# Patient Record
Sex: Female | Born: 1965 | Race: White | Hispanic: No | Marital: Married | Smoking: Never smoker
Health system: Southern US, Community
[De-identification: ages and names within clinical notes are randomized; demographics above are authoritative.]

## PROBLEM LIST (undated history)

## (undated) DIAGNOSIS — Z9889 Other specified postprocedural states: Secondary | ICD-10-CM

## (undated) DIAGNOSIS — K219 Gastro-esophageal reflux disease without esophagitis: Secondary | ICD-10-CM

## (undated) DIAGNOSIS — I471 Supraventricular tachycardia, unspecified: Secondary | ICD-10-CM

## (undated) DIAGNOSIS — S060X9A Concussion with loss of consciousness of unspecified duration, initial encounter: Secondary | ICD-10-CM

## (undated) DIAGNOSIS — K429 Umbilical hernia without obstruction or gangrene: Secondary | ICD-10-CM

## (undated) DIAGNOSIS — S82892A Other fracture of left lower leg, initial encounter for closed fracture: Secondary | ICD-10-CM

## (undated) DIAGNOSIS — T7840XA Allergy, unspecified, initial encounter: Secondary | ICD-10-CM

## (undated) DIAGNOSIS — S060XAA Concussion with loss of consciousness status unknown, initial encounter: Secondary | ICD-10-CM

## (undated) DIAGNOSIS — M1990 Unspecified osteoarthritis, unspecified site: Secondary | ICD-10-CM

## (undated) DIAGNOSIS — I351 Nonrheumatic aortic (valve) insufficiency: Secondary | ICD-10-CM

## (undated) DIAGNOSIS — Z860101 Personal history of adenomatous and serrated colon polyps: Secondary | ICD-10-CM

## (undated) DIAGNOSIS — Z860100 Personal history of colon polyps, unspecified: Secondary | ICD-10-CM

## (undated) DIAGNOSIS — R001 Bradycardia, unspecified: Secondary | ICD-10-CM

## (undated) DIAGNOSIS — J45909 Unspecified asthma, uncomplicated: Secondary | ICD-10-CM

## (undated) DIAGNOSIS — K635 Polyp of colon: Secondary | ICD-10-CM

## (undated) DIAGNOSIS — I499 Cardiac arrhythmia, unspecified: Secondary | ICD-10-CM

## (undated) DIAGNOSIS — E785 Hyperlipidemia, unspecified: Secondary | ICD-10-CM

## (undated) DIAGNOSIS — G709 Myoneural disorder, unspecified: Secondary | ICD-10-CM

## (undated) DIAGNOSIS — R011 Cardiac murmur, unspecified: Secondary | ICD-10-CM

## (undated) DIAGNOSIS — D332 Benign neoplasm of brain, unspecified: Secondary | ICD-10-CM

## (undated) HISTORY — DX: Allergy, unspecified, initial encounter: T78.40XA

## (undated) HISTORY — DX: Concussion with loss of consciousness of unspecified duration, initial encounter: S06.0X9A

## (undated) HISTORY — DX: Personal history of colon polyps, unspecified: Z86.0100

## (undated) HISTORY — DX: Unspecified osteoarthritis, unspecified site: M19.90

## (undated) HISTORY — DX: Cardiac murmur, unspecified: R01.1

## (undated) HISTORY — DX: Umbilical hernia without obstruction or gangrene: K42.9

## (undated) HISTORY — DX: Gastro-esophageal reflux disease without esophagitis: K21.9

## (undated) HISTORY — DX: Personal history of adenomatous and serrated colon polyps: Z86.0101

## (undated) HISTORY — DX: Concussion with loss of consciousness status unknown, initial encounter: S06.0XAA

## (undated) HISTORY — DX: Polyp of colon: K63.5

## (undated) HISTORY — DX: Nonrheumatic aortic (valve) insufficiency: I35.1

## (undated) HISTORY — PX: DILATION AND CURETTAGE OF UTERUS: SHX78

## (undated) HISTORY — DX: Supraventricular tachycardia, unspecified: I47.10

## (undated) HISTORY — DX: Other fracture of left lower leg, initial encounter for closed fracture: S82.892A

## (undated) HISTORY — DX: Hyperlipidemia, unspecified: E78.5

## (undated) HISTORY — DX: Supraventricular tachycardia: I47.1

## (undated) HISTORY — PX: LEFT HEART CATH: CATH118248

## (undated) HISTORY — DX: Other specified postprocedural states: Z98.890

## (undated) HISTORY — DX: Cardiac arrhythmia, unspecified: I49.9

## (undated) HISTORY — DX: Bradycardia, unspecified: R00.1

## (undated) HISTORY — DX: Myoneural disorder, unspecified: G70.9

---

## 2001-12-22 ENCOUNTER — Ambulatory Visit (HOSPITAL_COMMUNITY): Admission: RE | Admit: 2001-12-22 | Discharge: 2001-12-22 | Payer: Self-pay | Admitting: Pulmonary Disease

## 2001-12-30 ENCOUNTER — Ambulatory Visit (HOSPITAL_COMMUNITY): Admission: RE | Admit: 2001-12-30 | Discharge: 2001-12-30 | Payer: Self-pay | Admitting: Pulmonary Disease

## 2004-06-16 ENCOUNTER — Ambulatory Visit (HOSPITAL_COMMUNITY): Admission: RE | Admit: 2004-06-16 | Discharge: 2004-06-16 | Payer: Self-pay | Admitting: Pulmonary Disease

## 2006-04-21 ENCOUNTER — Ambulatory Visit (HOSPITAL_COMMUNITY): Admission: RE | Admit: 2006-04-21 | Discharge: 2006-04-21 | Payer: Self-pay | Admitting: Pulmonary Disease

## 2006-05-13 ENCOUNTER — Ambulatory Visit (HOSPITAL_COMMUNITY): Admission: RE | Admit: 2006-05-13 | Discharge: 2006-05-13 | Payer: Self-pay | Admitting: *Deleted

## 2006-09-07 DIAGNOSIS — R079 Chest pain, unspecified: Secondary | ICD-10-CM

## 2006-09-07 DIAGNOSIS — Z9889 Other specified postprocedural states: Secondary | ICD-10-CM

## 2006-09-07 HISTORY — DX: Other specified postprocedural states: Z98.890

## 2006-09-07 HISTORY — DX: Chest pain, unspecified: R07.9

## 2007-04-01 ENCOUNTER — Ambulatory Visit (HOSPITAL_COMMUNITY): Admission: RE | Admit: 2007-04-01 | Discharge: 2007-04-01 | Payer: Self-pay | Admitting: *Deleted

## 2007-11-24 ENCOUNTER — Ambulatory Visit (HOSPITAL_COMMUNITY): Admission: RE | Admit: 2007-11-24 | Discharge: 2007-11-24 | Payer: Self-pay | Admitting: Pulmonary Disease

## 2011-01-23 NOTE — Procedures (Signed)
NAME:  Bidwell, Jenavee                   ACCOUNT NO.:  [id number]   MEDICAL RECORD NO.:  [id number]          PATIENT TYPE:  OUT   LOCATION:  RAD                           FACILITY:  [facility]   PHYSICIAN:  Dani Gobble, MD       DATE OF BIRTH:  07/04/1966   DATE OF PROCEDURE:  04/21/2006  DATE OF DISCHARGE:                                  ECHOCARDIOGRAM   REFERRING:  Dr. Juanetta Gosling, Dr. Domingo Sep   INDICATION:  A 45 year old female with a past medical history of  palpitations who was referred to evaluate LV function.   The technical quality of the study is adequate.   The aorta measures normally at 3.1 cm.   The left atrium also measures normally at 3.6 cm.  The patient appeared to  be in sinus rhythm during this procedure.  No obvious clots or masses were  appreciated.   The interventricular septum and posterior wall were within normal limits,  measured at 1.1 cm for each.   The aortic valve was not well visualized.  The right coronary cusp and the  noncoronary cusp were well visualized but the left coronary cusp was not  well seen.  There was a mild eccentric jet of aortic insufficiency skirting  along the interventricular septum.  Doppler interrogation of the aortic  valve revealed a peak velocity of 1.7 meters per second corresponding to a  peak gradient of 12 mmHg and a mean gradient of 7 mmHg.  There was a subtle  suggestion of eccentric closure noted in the parasternal view and I cannot  exclude the possibility of a bicuspid aortic valve on this study.   The mitral valve appeared structurally normal with normal leaflet excursion.  There was trivial jet of mitral regurgitation.  Doppler interrogation of the  mitral valve was within normal limits.   The pulmonic valve was not well visualized.   The tricuspid valve appeared grossly structurally normal with mild tricuspid  regurgitation noted.  There was no suggestion of pulmonary hypertension on  this study.   The left ventricle  is normal in size with the LV IDD measured at 4 cm, LV  ISD measured at 2.9 cm.  Overall left systolic function was normal and no  regional wall motion abnormalities were appreciated.   The right atrium and right ventricle were normal in size and right  ventricular systolic function was normal.  No pericardial effusion was  appreciated.   IMPRESSION:  1. The aortic valve leaflets were not clearly delineated and specifically      the left coronary cusp was not visualized.  This may be secondary to      technical limitations of the study; however, I cannot exclude the      possibility of a bicuspid aortic valve.  There was a mild jet of      eccentric aortic insufficiency skirting along the interventricular      septum.  Doppler interrogation of the aortic valve was minimally      elevated but no significant stenosis was suggested by the current  parameters.  2. Trivial mitral and mild tricuspid regurgitation.  3. Normal left ventricular size and systolic function without regional      wall motion abnormalities.           ______________________________  Dani Gobble, MD     AB/MEDQ  D:  04/22/2006  T:  04/22/2006  Job:  [id number]

## 2013-04-20 ENCOUNTER — Telehealth (HOSPITAL_COMMUNITY): Payer: Self-pay | Admitting: Cardiovascular Disease

## 2013-04-20 ENCOUNTER — Other Ambulatory Visit: Payer: Self-pay | Admitting: *Deleted

## 2013-04-20 DIAGNOSIS — I351 Nonrheumatic aortic (valve) insufficiency: Secondary | ICD-10-CM

## 2013-04-20 DIAGNOSIS — R0602 Shortness of breath: Secondary | ICD-10-CM

## 2013-04-25 ENCOUNTER — Telehealth (HOSPITAL_COMMUNITY): Payer: Self-pay | Admitting: Cardiovascular Disease

## 2013-05-04 ENCOUNTER — Telehealth (HOSPITAL_COMMUNITY): Payer: Self-pay | Admitting: Cardiovascular Disease

## 2013-05-06 ENCOUNTER — Other Ambulatory Visit: Payer: Self-pay | Admitting: Cardiovascular Disease

## 2013-05-06 LAB — COMPREHENSIVE METABOLIC PANEL
ALT: 15 U/L (ref 0–35)
AST: 18 U/L (ref 0–37)
Albumin: 4.2 g/dL (ref 3.5–5.2)
Alkaline Phosphatase: 87 U/L (ref 39–117)
BUN: 12 mg/dL (ref 6–23)
CO2: 27 mEq/L (ref 19–32)
Calcium: 8.9 mg/dL (ref 8.4–10.5)
Chloride: 104 mEq/L (ref 96–112)
Creat: 0.84 mg/dL (ref 0.50–1.10)
Glucose, Bld: 85 mg/dL (ref 70–99)
Potassium: 3.7 mEq/L (ref 3.5–5.3)
Sodium: 139 mEq/L (ref 135–145)
Total Bilirubin: 0.5 mg/dL (ref 0.3–1.2)
Total Protein: 6.5 g/dL (ref 6.0–8.3)

## 2013-05-06 LAB — CBC WITH DIFFERENTIAL/PLATELET
Basophils Absolute: 0 10*3/uL (ref 0.0–0.1)
Basophils Relative: 1 % (ref 0–1)
Eosinophils Absolute: 0.2 10*3/uL (ref 0.0–0.7)
Eosinophils Relative: 5 % (ref 0–5)
HCT: 37 % (ref 36.0–46.0)
Hemoglobin: 12.5 g/dL (ref 12.0–15.0)
Lymphocytes Relative: 31 % (ref 12–46)
Lymphs Abs: 1.4 10*3/uL (ref 0.7–4.0)
MCH: 27.5 pg (ref 26.0–34.0)
MCHC: 33.8 g/dL (ref 30.0–36.0)
MCV: 81.5 fL (ref 78.0–100.0)
Monocytes Absolute: 0.5 10*3/uL (ref 0.1–1.0)
Monocytes Relative: 11 % (ref 3–12)
Neutro Abs: 2.3 10*3/uL (ref 1.7–7.7)
Neutrophils Relative %: 52 % (ref 43–77)
Platelets: 314 10*3/uL (ref 150–400)
RBC: 4.54 MIL/uL (ref 3.87–5.11)
RDW: 14.6 % (ref 11.5–15.5)
WBC: 4.4 10*3/uL (ref 4.0–10.5)

## 2013-05-06 LAB — TSH: TSH: 2.484 u[IU]/mL (ref 0.350–4.500)

## 2013-05-06 LAB — LIPID PANEL
Cholesterol: 158 mg/dL (ref 0–200)
HDL: 52 mg/dL (ref >39)
LDL Cholesterol: 77 mg/dL (ref 0–99)
Total CHOL/HDL Ratio: 3 Ratio
Triglycerides: 144 mg/dL (ref <150)
VLDL: 29 mg/dL (ref 0–40)

## 2013-05-11 ENCOUNTER — Telehealth (HOSPITAL_COMMUNITY): Payer: Self-pay | Admitting: Cardiovascular Disease

## 2013-05-16 ENCOUNTER — Encounter: Payer: Self-pay | Admitting: Cardiovascular Disease

## 2013-06-12 ENCOUNTER — Telehealth (HOSPITAL_COMMUNITY): Payer: Self-pay | Admitting: *Deleted

## 2013-06-15 ENCOUNTER — Telehealth (HOSPITAL_COMMUNITY): Payer: Self-pay | Admitting: *Deleted

## 2013-07-04 ENCOUNTER — Ambulatory Visit (HOSPITAL_COMMUNITY)
Admission: RE | Admit: 2013-07-04 | Discharge: 2013-07-04 | Disposition: A | Discharge status: Home / Self Care | Payer: BC Managed Care – PPO | Source: Ambulatory Visit | Attending: Cardiovascular Disease | Admitting: Cardiovascular Disease

## 2013-07-04 DIAGNOSIS — I351 Nonrheumatic aortic (valve) insufficiency: Secondary | ICD-10-CM

## 2013-07-04 DIAGNOSIS — I359 Nonrheumatic aortic valve disorder, unspecified: Secondary | ICD-10-CM | POA: Insufficient documentation

## 2013-07-04 DIAGNOSIS — R0602 Shortness of breath: Secondary | ICD-10-CM

## 2013-07-04 NOTE — Progress Notes (Signed)
2D Echo Performed 07/04/2013    Kia Varnadore, RCS  

## 2013-08-23 ENCOUNTER — Other Ambulatory Visit: Payer: Self-pay | Admitting: *Deleted

## 2013-08-23 MED ORDER — PRAVASTATIN SODIUM 40 MG PO TABS: 40.0000 mg | ORAL_TABLET | Freq: Every evening | ORAL | Status: DC

## 2013-09-25 ENCOUNTER — Other Ambulatory Visit: Payer: Self-pay | Admitting: *Deleted

## 2013-09-25 MED ORDER — METOPROLOL TARTRATE 25 MG PO TABS: 25.0000 mg | ORAL_TABLET | Freq: Two times a day (BID) | ORAL | Status: DC | PRN

## 2013-09-25 NOTE — Telephone Encounter (Signed)
Rx was sent to pharmacy electronically. 

## 2013-10-19 ENCOUNTER — Encounter: Payer: Self-pay | Admitting: Internal Medicine

## 2013-10-19 ENCOUNTER — Ambulatory Visit (INDEPENDENT_AMBULATORY_CARE_PROVIDER_SITE_OTHER): Payer: BC Managed Care – PPO | Admitting: Internal Medicine

## 2013-10-19 VITALS — BP 120/88 | HR 69 | Ht 63.0 in | Wt 170.0 lb

## 2013-10-19 DIAGNOSIS — R002 Palpitations: Secondary | ICD-10-CM | POA: Insufficient documentation

## 2013-10-19 DIAGNOSIS — I471 Supraventricular tachycardia: Secondary | ICD-10-CM | POA: Insufficient documentation

## 2013-10-19 DIAGNOSIS — E785 Hyperlipidemia, unspecified: Secondary | ICD-10-CM | POA: Insufficient documentation

## 2013-10-19 DIAGNOSIS — R9431 Abnormal electrocardiogram [ECG] [EKG]: Secondary | ICD-10-CM

## 2013-10-19 NOTE — Patient Instructions (Signed)
Your physician wants you to follow-up in:  6 months. You will receive a reminder letter in the mail two months in advance. If you don't receive a letter, please call our office to schedule the follow-up appointment.   

## 2013-10-19 NOTE — Progress Notes (Signed)
OFFICE NOTE  Chief Complaint:  Establish new cardiologist  Primary Care Physician: Alonza Bogus, MD  HPI:  Debbie Allen is a pleasant 48 year old female previously followed by Dr. Rollene Fare. She is here today to establish cardiac care with me. Her past medical history significant for palpitations and what sounds like SVT however it was never documented. 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 Although she does have a history of palpitations she reports that taking the wrap female and metoprolol has controlled them significantly. Recently she had flulike symptoms and probably did have flu about 3 weeks ago with cough, bodyaches, chills and low-grade fevers. During that episode she felt her heart race one time significantly and almost made her pass out. Fortunately stopped spontaneously and she has not had a recurrence. This could be due to electrolyte changes or the fact that she was infected. Overall though she is doing quite well. 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 She asked today about weight loss products as she might use I strongly counseled against them saying that they may of course increase her palpitations especially any other stimulant products or products that contain caffeine.  PMHx:  Past Medical History  Diagnosis Date  . Aortic regurgitation     Asymptomatic  . Palpitations   . Acid reflux     Past Surgical History  Procedure Laterality Date  . 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 Recommend medical therapy.  . Cardiovascular stress test  06/04/2008    No significant ischemia demonstrated.  . Transthoracic echocardiogram  04/14/2012    EF >55%, mild-moderate aortic regurg    FAMHx:  Family History  Problem Relation Age of Onset  . Heart disease Father 21  . Hyperlipidemia Father   . Hyperlipidemia  Sister   . Hypertension Paternal Grandmother   . Heart disease Paternal Grandfather   . Heart attack Paternal Grandfather     SOCHx:   reports that she has never smoked. She has never used smokeless tobacco. She reports that she drinks alcohol. She reports that she does not use illicit drugs.  ALLERGIES:  Allergies  Allergen Reactions  . Codeine     ROS: A comprehensive review of systems was negative except for: Cardiovascular: positive for palpitations  HOME MEDS: Current Outpatient Prescriptions  Medication Sig Dispense Refill  . albuterol (PROVENTIL HFA;VENTOLIN HFA) 108 (90 BASE) MCG/ACT inhaler Inhale 2 puffs into the lungs every 6 (six) hours as needed for wheezing or shortness of breath.      . budesonide-formoterol (SYMBICORT) 160-4.5 MCG/ACT inhaler Inhale 2 puffs into the lungs 2 (two) times daily as needed.      Marland Kitchen levocetirizine (XYZAL) 5 MG tablet Take 5 mg by mouth daily.      . metoprolol tartrate (LOPRESSOR) 25 MG tablet Take 1 tablet (25 mg total) by mouth 2 (two) times daily as needed.  180 tablet  1  . montelukast (SINGULAIR) 10 MG tablet Take 1 tablet by mouth daily.      . Olopatadine HCl 0.6 % SOLN Place into the nose as needed.      . pantoprazole (PROTONIX) 40 MG tablet Take 40 mg by mouth daily.      . pravastatin (PRAVACHOL) 40 MG tablet Take 1 tablet (40 mg total) by mouth every evening.  90 tablet  3  . verapamil (  VERELAN PM) 240 MG 24 hr capsule Take 240 mg by mouth daily.       No current facility-administered medications for this visit.    LABS/IMAGING: No results found for this or any previous visit (from the past 48 hour(s)). No results found.  VITALS: BP 120/88  Pulse 69  Ht 5\' 3"  (1.6 m)  Wt 170 lb (77.111 kg)  BMI 30.12 kg/m2  EXAM: General appearance: alert and no distress Neck: no carotid bruit and no JVD Lungs: clear to auscultation bilaterally Heart: regular rate and rhythm, S1, S2 normal, no murmur, click, rub or  gallop Abdomen: soft, non-tender; bowel sounds normal; no masses,  no organomegaly Extremities: extremities normal, atraumatic, no cyanosis or edema Pulses: 2+ and symmetric Skin: Skin color, texture, turgor normal. No rashes or lesions Neurologic: Grossly normal Psych: Mood, affect normal  EKG: Sinus rhythm at 69 with a short PR interval  ASSESSMENT: 1. Palpitations 2. 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 Dyslipidemia  PLAN: 1.   Mrs. Mclester had an episode of acute onset tachycardia which resolved abruptly during an episode which sounds like influenza several weeks ago. Shortly she recovered has not had any more palpitations. Overall her control is very good of her palpitations with a beta blocker. Her cholesterol is also well controlled. Continue to encourage her to work on weight last unfortunately there is no magic bullet for medication is really beneficial for her, and actually they may be more harmful potentially causing more palpitations.  Plan to see her back in 6 months.  Pixie Casino, MD, [facility] Attending Cardiologist [facility]  Rilen Shukla C 10/19/2013, 5:07 PM

## 2013-11-08 ENCOUNTER — Other Ambulatory Visit: Payer: Self-pay

## 2013-11-08 MED ORDER — PANTOPRAZOLE SODIUM 40 MG PO TBEC: 40.0000 mg | DELAYED_RELEASE_TABLET | Freq: Every day | ORAL | Status: DC

## 2013-11-08 MED ORDER — VERAPAMIL HCL ER 240 MG PO CP24: 240.0000 mg | ORAL_CAPSULE | Freq: Every day | ORAL | Status: DC

## 2013-11-08 NOTE — Addendum Note (Signed)
Addended by: Diana Eves on: 11/08/2013 12:35 PM   Modules accepted: Orders

## 2013-11-08 NOTE — Telephone Encounter (Signed)
Rx was sent to pharmacy electronically. 

## 2013-11-16 ENCOUNTER — Telehealth: Payer: Self-pay | Admitting: Internal Medicine

## 2013-11-16 NOTE — Telephone Encounter (Signed)
Calling because per her insurance Dr.Hilty need show the reason to which why she needs this particular medication(Protonics). Have tried everything over the  counter and this is the only medication that works and is also asking for her echo results ... Please call

## 2013-11-16 NOTE — Telephone Encounter (Signed)
Returned call.  Left generic message that message received and will be forwarded to Dr. Lysbeth Penner nurse.  Also to call back before 4pm if questions.

## 2013-11-17 NOTE — Telephone Encounter (Signed)
LMTBC  Dr. Debara Pickett - echo on 09/03/13 - ordered by Dr. Patria Mane - patient is asking for results and I do not see a result note or documentation in paper chart that the patient was notified. Can you review the echo in EPIC so that I may notify her?

## 2013-11-17 NOTE — Telephone Encounter (Signed)
Echo on 07/04/13 looked okay, except for mild to moderate aortic insufficiency.  Dr. Debara Pickett

## 2013-11-20 ENCOUNTER — Encounter: Payer: Self-pay | Admitting: *Deleted

## 2013-11-20 NOTE — Telephone Encounter (Signed)
PA for pantoprazole to [facility] faxed 11/20/13

## 2013-11-20 NOTE — Telephone Encounter (Signed)
LMTCB

## 2013-11-21 NOTE — Telephone Encounter (Signed)
Patient reached at cell number [phone] left on this RN's voicemail. Notified of echo results from 06/2013 and notified that PA for pantoprazole was faxed 3/16

## 2013-11-30 NOTE — Telephone Encounter (Signed)
Called pharmacy. PA for pantoprazole was approved - ran through and is ready for pick up. LM on home machine with this info.

## 2014-03-02 ENCOUNTER — Other Ambulatory Visit: Payer: Self-pay | Admitting: Internal Medicine

## 2014-03-02 NOTE — Telephone Encounter (Signed)
Rx refill sent to patient pharmacy   

## 2014-04-16 ENCOUNTER — Encounter: Payer: Self-pay | Admitting: Cardiology

## 2014-04-16 ENCOUNTER — Ambulatory Visit (INDEPENDENT_AMBULATORY_CARE_PROVIDER_SITE_OTHER): Payer: BC Managed Care – PPO | Admitting: Cardiology

## 2014-04-16 VITALS — BP 106/72 | HR 62 | Ht 63.0 in | Wt 171.0 lb

## 2014-04-16 DIAGNOSIS — I351 Nonrheumatic aortic (valve) insufficiency: Secondary | ICD-10-CM | POA: Insufficient documentation

## 2014-04-16 DIAGNOSIS — I359 Nonrheumatic aortic valve disorder, unspecified: Secondary | ICD-10-CM

## 2014-04-16 DIAGNOSIS — I471 Supraventricular tachycardia: Secondary | ICD-10-CM

## 2014-04-16 NOTE — Patient Instructions (Signed)

## 2014-04-16 NOTE — Assessment & Plan Note (Signed)
Asymptomatic, mild to moderate by echocardiogram last year.

## 2014-04-16 NOTE — Progress Notes (Signed)
Clinical Summary Debbie Allen is a 48 y.o.female former patient of Dr. Debara Pickett, just established with him back in February of this year, now requesting change to the [facility] office. Cardiac history reviewed. She tells me that she has been doing very well in terms of palpitations. She hs been walking for exercise over the summer. 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 She uses her Lopressor daily, and this has been the case for the last several months.  Echocardiogram from October 2014 showed normal LVEF, mildly thickened trileaflet aortic valve with mild to moderate aortic regurgitation, no other significant valvular abnormalities.  ECG from February showed sinus rhythm with short PR interval.  Allergies  Allergen Reactions  . Codeine     Current Outpatient Prescriptions  Medication Sig Dispense Refill  . albuterol (PROVENTIL HFA;VENTOLIN HFA) 108 (90 BASE) MCG/ACT inhaler Inhale 2 puffs into the lungs every 6 (six) hours as needed for wheezing or shortness of breath.      . budesonide-formoterol (SYMBICORT) 160-4.5 MCG/ACT inhaler Inhale 2 puffs into the lungs 2 (two) times daily as needed.      Marland Kitchen levocetirizine (XYZAL) 5 MG tablet Take 5 mg by mouth daily.      . metoprolol tartrate (LOPRESSOR) 25 MG tablet Take 1 tablet (25 mg total) by mouth 2 (two) times daily as needed.  180 tablet  1  . montelukast (SINGULAIR) 10 MG tablet Take 1 tablet by mouth daily.      . Olopatadine HCl 0.6 % SOLN Place into the nose as needed.      . pantoprazole (PROTONIX) 40 MG tablet Take 1 tablet (40 mg total) by mouth daily.  30 tablet  11  . pravastatin (PRAVACHOL) 40 MG tablet Take 1 tablet (40 mg total) by mouth every evening.  90 tablet  3  . verapamil (VERELAN PM) 240 MG 24 hr capsule TAKE ONE CAPSULE BY MOUTH ONCE DAILY  30 capsule  5   No current facility-administered medications for this visit.    Past Medical History  Diagnosis Date  . Aortic regurgitation      Asymptomatic  . PSVT (paroxysmal supraventricular tachycardia)     Palpitations - suspected diagnosis (LGL postulated by Dr. Debara Pickett but never documented)  . Acid reflux   . 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 Recommend medical therapy.    Social History Debbie Allen reports that she has never smoked. She has never used smokeless tobacco. Debbie Allen reports that she drinks alcohol.  Review of Systems Other systems reviewed and negative except as outlined.  Physical Examination Filed Vitals:   04/16/14 1140  BP: 106/72  Pulse: 62   Filed Weights   04/16/14 1140  Weight: 171 lb (77.565 kg)   Patient appears comfortable at rest. HEENT: Conjunctiva and lids normal, oropharynx clear. Neck: Supple, no elevated JVP or carotid bruits, no thyromegaly. Lungs: Clear to auscultation, nonlabored breathing at rest. Cardiac: Regular rate and rhythm, no S3 with 1-1/9 systolic murmur, very soft diastolic murmur, no pericardial rub. Abdomen: Soft, nontender, bowel sounds present. Extremities: No pitting edema, distal pulses 2+. Skin: Warm and dry. Musculoskeletal: No kyphosis. Neuropsychiatric: Alert and oriented x3, affect grossly appropriate.   Problem List and Plan   PSVT (paroxysmal supraventricular tachycardia) Presumed diagnosis based on record review, has never had any documented arrhythmias with long-standing history of palpitations that seem consistent with PSVT. Plan is to  continue beta blocker and observation. Certainly if her symptoms worsen, we can always consider followup monitoring.  Aortic regurgitation Asymptomatic, mild to moderate by echocardiogram last year.    Satira Sark, M.D., F.A.C.C.

## 2014-04-16 NOTE — Assessment & Plan Note (Signed)
Presumed diagnosis based on record review, has never had any documented arrhythmias with long-standing history of palpitations that seem consistent with PSVT. Plan is to continue beta blocker and observation. Certainly if her symptoms worsen, we can always consider followup monitoring.

## 2014-04-19 ENCOUNTER — Ambulatory Visit: Payer: BC Managed Care – PPO | Admitting: Cardiology

## 2014-06-15 ENCOUNTER — Encounter: Payer: Self-pay | Admitting: Cardiovascular Disease

## 2014-06-15 ENCOUNTER — Encounter: Payer: Self-pay | Admitting: Adult Health

## 2014-06-15 ENCOUNTER — Ambulatory Visit (INDEPENDENT_AMBULATORY_CARE_PROVIDER_SITE_OTHER): Payer: BC Managed Care – PPO | Admitting: Adult Health

## 2014-06-15 VITALS — BP 126/72 | HR 72 | Ht 63.0 in | Wt 169.0 lb

## 2014-06-15 DIAGNOSIS — I471 Supraventricular tachycardia: Secondary | ICD-10-CM

## 2014-06-15 DIAGNOSIS — R072 Precordial pain: Secondary | ICD-10-CM

## 2014-06-15 DIAGNOSIS — E785 Hyperlipidemia, unspecified: Secondary | ICD-10-CM

## 2014-06-15 DIAGNOSIS — R079 Chest pain, unspecified: Secondary | ICD-10-CM | POA: Insufficient documentation

## 2014-06-15 DIAGNOSIS — R002 Palpitations: Secondary | ICD-10-CM

## 2014-06-15 NOTE — Progress Notes (Signed)
Patient seen by me for the first time in August, formerly Dr. Debara Pickett, previously Dr. Rollene Fare. She reported good control of palpitations at the time on beta blocker. Also mild to moderate asymptomatic aortic regurgitation. She came in today to see Ms. 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 We do not have the actual cardiac catheterization report for review.  Ms. Purcell Nails NP discussed the case with me. I asked her to obtain the actual cardiac catheterization report to review the results and make sure that there were no other abnormalities of concern. If this is the case, we will proceed with followup stress test and then bring her back to the office for discussion.  Satira Sark, M.D., F.A.C.C.

## 2014-06-15 NOTE — Assessment & Plan Note (Signed)
She has had no complaints of palpitations. Medically complaint with verapamil. No changes in her medications at this time.

## 2014-06-15 NOTE — Progress Notes (Deleted)
Name: Debbie Allen    DOB: January 02, 1966  Age: 48 y.o.  MR#: [id number]       PCP:  Alonza Bogus, MD      Insurance: Payor: [place] / Plan: BCBS [place] PPO / Product Type: *No Product type* /   CC:    Chief Complaint  Patient presents with  . Tachycardia    VS Filed Vitals:   06/15/14 1307  BP: 126/72  Pulse: 72  Height: 5\' 3"  (1.6 m)  Weight: 169 lb (76.658 kg)    Weights Current Weight  06/15/14 169 lb (76.658 kg)  04/16/14 171 lb (77.565 kg)  10/19/13 170 lb (77.111 kg)    Blood Pressure  BP Readings from Last 3 Encounters:  06/15/14 126/72  04/16/14 106/72  10/19/13 120/88     Admit date:  (Not on file) Last encounter with RMR:  Visit date not found   Allergy Codeine  Current Outpatient Prescriptions  Medication Sig Dispense Refill  . albuterol (PROVENTIL HFA;VENTOLIN HFA) 108 (90 BASE) MCG/ACT inhaler Inhale 2 puffs into the lungs every 6 (six) hours as needed for wheezing or shortness of breath.      . budesonide-formoterol (SYMBICORT) 160-4.5 MCG/ACT inhaler Inhale 2 puffs into the lungs 2 (two) times daily as needed.      Marland Kitchen levocetirizine (XYZAL) 5 MG tablet Take 5 mg by mouth daily.      . metoprolol tartrate (LOPRESSOR) 25 MG tablet Take 1 tablet (25 mg total) by mouth 2 (two) times daily as needed.  180 tablet  1  . montelukast (SINGULAIR) 10 MG tablet Take 1 tablet by mouth daily.      . Olopatadine HCl 0.6 % SOLN Place into the nose as needed.      . pantoprazole (PROTONIX) 40 MG tablet Take 1 tablet (40 mg total) by mouth daily.  30 tablet  11  . pravastatin (PRAVACHOL) 40 MG tablet Take 1 tablet (40 mg total) by mouth every evening.  90 tablet  3  . verapamil (VERELAN PM) 240 MG 24 hr capsule TAKE ONE CAPSULE BY MOUTH ONCE DAILY  30 capsule  5   No current facility-administered medications for this visit.    Discontinued Meds:   There are no discontinued medications.  Patient Active Problem List   Diagnosis Date Noted  . Aortic  regurgitation 04/16/2014  . Palpitations 10/19/2013  . PSVT (paroxysmal supraventricular tachycardia) 10/19/2013  . Dyslipidemia 10/19/2013    LABS    Component Value Date/Time   NA 139 05/06/2013 1134   K 3.7 05/06/2013 1134   CL 104 05/06/2013 1134   CO2 27 05/06/2013 1134   GLUCOSE 85 05/06/2013 1134   BUN 12 05/06/2013 1134   CREATININE 0.84 05/06/2013 1134   CALCIUM 8.9 05/06/2013 1134   CMP     Component Value Date/Time   NA 139 05/06/2013 1134   K 3.7 05/06/2013 1134   CL 104 05/06/2013 1134   CO2 27 05/06/2013 1134   GLUCOSE 85 05/06/2013 1134   BUN 12 05/06/2013 1134   CREATININE 0.84 05/06/2013 1134   CALCIUM 8.9 05/06/2013 1134   PROT 6.5 05/06/2013 1134   ALBUMIN 4.2 05/06/2013 1134   AST 18 05/06/2013 1134   ALT 15 05/06/2013 1134   ALKPHOS 87 05/06/2013 1134   BILITOT 0.5 05/06/2013 1134       Component Value Date/Time   WBC 4.4 05/06/2013 1134   HGB 12.5 05/06/2013 1134   HCT 37.0 05/06/2013 1134  MCV 81.5 05/06/2013 1134    Lipid Panel     Component Value Date/Time   CHOL 158 05/06/2013 1134   TRIG 144 05/06/2013 1134   HDL 52 05/06/2013 1134   CHOLHDL 3.0 05/06/2013 1134   VLDL 29 05/06/2013 1134   LDLCALC 77 05/06/2013 1134    ABG No results found for this basename: phart, pco2, pco2art, po2, po2art, hco3, tco2, acidbasedef, o2sat     Lab Results  Component Value Date   TSH 2.484 05/06/2013   BNP (last 3 results) No results found for this basename: PROBNP,  in the last 8760 hours Cardiac Panel (last 3 results) No results found for this basename: CKTOTAL, CKMB, TROPONINI, RELINDX,  in the last 72 hours  Iron/TIBC/Ferritin/ %Sat No results found for this basename: iron, tibc, ferritin, ironpctsat     EKG Orders placed in visit on 10/19/13  . EKG 12-LEAD     Prior Assessment and Plan Problem List as of 06/15/2014     Cardiovascular and Mediastinum   PSVT (paroxysmal supraventricular tachycardia)   Last Assessment & Plan   04/16/2014 Office Visit Written  04/16/2014 12:05 PM by Satira Sark, MD     Presumed diagnosis based on record review, has never had any documented arrhythmias with long-standing history of palpitations that seem consistent with PSVT. Plan is to continue beta blocker and observation. Certainly if her symptoms worsen, we can always consider followup monitoring.    Aortic regurgitation   Last Assessment & Plan   04/16/2014 Office Visit Written 04/16/2014 12:05 PM by Satira Sark, MD     Asymptomatic, mild to moderate by echocardiogram last year.      Other   Palpitations   Dyslipidemia       Imaging: No results found.

## 2014-06-15 NOTE — Assessment & Plan Note (Addendum)
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 We will get a copy of the cardiac cath for review of anatomy of her vessels. I will also plan a stress test for diagnostic/prognositc purposes while awaiting her records.   In the interim, she will also have annual labs to include a Mg+ and H-Pylori as she is also worried about a possible GERD component of her symptoms. She is on a PPI and I have suggested OTC H-2 Blocker as well. We will see her again in a couple of weeks to discuss the results and make further recommendations.   I have discussed this with Dr. Domenic Polite on site who is in agreement with this plan. He will review cath lab report as well.   Addendum: Cardiac cath report was reviewed by me and by Dr. Domenic Polite. It was competed on 04/07/2014. This is scanned into the chart. 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 Most of the septal perforator branch is intramyocardial with significant myocardia bridging. With complete disappearance of contrast during systole. Please review cath report scanned for further information.   Will proceed with stress test and use Lexican, as she will need to stay on verapamil. Consideration for cardiac MRI will be discussed on follow up appt if necessary.

## 2014-06-15 NOTE — Assessment & Plan Note (Signed)
Fasting lipids and LFTs are ordered for assessment of status.

## 2014-06-15 NOTE — Progress Notes (Signed)
HPI: Debbie Allen is a 48 year old female patient of Dr. Sherryle Lis that is normally followed in the [place] office, who we follow for ongoing assessment and management of PSVT, with complaints of palpitations. She was last seen by Dr. Chapman Fitch, August 2015. Review of his notes documents echocardiogram, October 2014 with normal LVEF with mildly thickened trileaflet aortic valve and mild to moderate aortic regurg with no significant valvular abnormalities. Her last office visit, the patient was stable without any further complaints of palpitations or racing heart rate was continued on metoprolol.  She states that she has been having chest pressure radiating up in to her left jaw. She states that she is feeling it a lot more and it has become more worrisome ot her. 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 Was told that it was "calmping down" and causing her to have chest pain. PSVT made it worse. She is experiencing some of the same pain but not as bad, pressure, no palpitations or dyspnea.     Allergies  Allergen Reactions  . Codeine     Current Outpatient Prescriptions  Medication Sig Dispense Refill  . albuterol (PROVENTIL HFA;VENTOLIN HFA) 108 (90 BASE) MCG/ACT inhaler Inhale 2 puffs into the lungs every 6 (six) hours as needed for wheezing or shortness of breath.      . budesonide-formoterol (SYMBICORT) 160-4.5 MCG/ACT inhaler Inhale 2 puffs into the lungs 2 (two) times daily as needed.      Marland Kitchen levocetirizine (XYZAL) 5 MG tablet Take 5 mg by mouth daily.      . metoprolol tartrate (LOPRESSOR) 25 MG tablet Take 1 tablet (25 mg total) by mouth 2 (two) times daily as needed.  180 tablet  1  . montelukast (SINGULAIR) 10 MG tablet Take 1 tablet by mouth daily.      . Olopatadine HCl 0.6 % SOLN Place into the nose as needed.      . pantoprazole (PROTONIX) 40 MG tablet Take 1 tablet (40 mg total) by mouth daily.  30 tablet  11  . pravastatin  (PRAVACHOL) 40 MG tablet Take 1 tablet (40 mg total) by mouth every evening.  90 tablet  3  . verapamil (VERELAN PM) 240 MG 24 hr capsule TAKE ONE CAPSULE BY MOUTH ONCE DAILY  30 capsule  5   No current facility-administered medications for this visit.    Past Medical History  Diagnosis Date  . Aortic regurgitation     Asymptomatic  . PSVT (paroxysmal supraventricular tachycardia)     Palpitations - suspected diagnosis (LGL postulated by Dr. Debara Pickett but never documented)  . Acid reflux   . 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 Recommend medical therapy.    History reviewed. No pertinent past surgical history.  ROS: Review of systems complete and found to be negative unless listed above  PHYSICAL EXAM BP 126/72  Pulse 72  Ht 5\' 3"  (1.6 m)  Wt 169 lb (76.658 kg)  BMI 29.94 kg/m2 General: Well developed, well nourished, in no acute distress Head: Eyes PERRLA, No xanthomas.   Normal cephalic and atramatic  Lungs: Clear bilaterally to auscultation and percussion. Heart: HRRR S1 S2, without MRG.  Pulses are 2+ & equal.            No carotid bruit. No JVD.  No abdominal bruits. No femoral bruits. Abdomen: Bowel sounds are positive, abdomen soft and non-tender without masses  or                  Hernia's noted. Msk:  Back normal, normal gait. Normal strength and tone for age. Extremities: No clubbing, cyanosis or edema.  DP +1 Neuro: Alert and oriented X 3. Psych:  Good affect, responds appropriately   EKG:    ASSESSMENT AND PLAN

## 2014-06-15 NOTE — Assessment & Plan Note (Signed)
Per Echo in 2014 Aortic valve: Mild to moderate regurgitation directed centrally in the LVOT. - Pulmonary arteries: PA peak pressure: 105mm Hg (S).  No associated symptoms other than chest pressure.

## 2014-06-15 NOTE — Patient Instructions (Addendum)
Your physician recommends that you schedule a follow-up appointment in: 2-3 weeks    Please get fasting blood work     Your physician has requested that you have en exercise stress myoview. For further information please visit [email or web address]. Please follow instruction sheet, as given.           Thank you for choosing [facility] !

## 2014-06-16 LAB — CBC
HCT: 37.9 % (ref 36.0–46.0)
Hemoglobin: 12.6 g/dL (ref 12.0–15.0)
MCH: 27.5 pg (ref 26.0–34.0)
MCHC: 33.2 g/dL (ref 30.0–36.0)
MCV: 82.6 fL (ref 78.0–100.0)
Platelets: 347 10*3/uL (ref 150–400)
RBC: 4.59 MIL/uL (ref 3.87–5.11)
RDW: 14.2 % (ref 11.5–15.5)
WBC: 5.7 10*3/uL (ref 4.0–10.5)

## 2014-06-16 LAB — HEPATIC FUNCTION PANEL
ALT: 12 U/L (ref 0–35)
AST: 17 U/L (ref 0–37)
Albumin: 4.3 g/dL (ref 3.5–5.2)
Alkaline Phosphatase: 95 U/L (ref 39–117)
Bilirubin, Direct: 0.1 mg/dL (ref 0.0–0.3)
Indirect Bilirubin: 0.6 mg/dL (ref 0.2–1.2)
Total Bilirubin: 0.7 mg/dL (ref 0.2–1.2)
Total Protein: 6.7 g/dL (ref 6.0–8.3)

## 2014-06-16 LAB — BASIC METABOLIC PANEL
BUN: 9 mg/dL (ref 6–23)
CO2: 26 mEq/L (ref 19–32)
Calcium: 9 mg/dL (ref 8.4–10.5)
Chloride: 103 mEq/L (ref 96–112)
Creat: 0.76 mg/dL (ref 0.50–1.10)
Glucose, Bld: 82 mg/dL (ref 70–99)
Potassium: 4.1 mEq/L (ref 3.5–5.3)
Sodium: 138 mEq/L (ref 135–145)

## 2014-06-16 LAB — LIPID PANEL
Cholesterol: 186 mg/dL (ref 0–200)
HDL: 49 mg/dL (ref >39)
LDL Cholesterol: 106 mg/dL — ABNORMAL HIGH (ref 0–99)
Total CHOL/HDL Ratio: 3.8 Ratio
Triglycerides: 154 mg/dL — ABNORMAL HIGH (ref <150)
VLDL: 31 mg/dL (ref 0–40)

## 2014-06-16 LAB — MAGNESIUM: Magnesium: 2 mg/dL (ref 1.5–2.5)

## 2014-06-18 ENCOUNTER — Telehealth: Payer: Self-pay | Admitting: *Deleted

## 2014-06-18 LAB — H. PYLORI ANTIBODY, IGG: H Pylori IgG: 1.05 {ISR} — ABNORMAL HIGH

## 2014-06-18 NOTE — Telephone Encounter (Signed)
Message copied by Desma Mcgregor on Mon Jun 18, 2014  7:42 AM ------      Message from: Lendon Colonel      Created: Mon Jun 18, 2014  7:26 AM       Reviewed.No changes ------

## 2014-06-18 NOTE — Telephone Encounter (Signed)
Pt.notified

## 2014-06-18 NOTE — Telephone Encounter (Signed)
Message copied by Desma Mcgregor on Mon Jun 18, 2014 11:54 AM ------      Message from: Lendon Colonel      Created: Mon Jun 18, 2014 11:47 AM       Reviewed. She is to see Dr. Domenic Polite on follow up. Having a stress test too. ------

## 2014-06-18 NOTE — Telephone Encounter (Signed)
Pt husband notified, confirmed stress test appt, forwarded to Dr. Luan Pulling

## 2014-06-21 ENCOUNTER — Ambulatory Visit (HOSPITAL_COMMUNITY): Payer: BC Managed Care – PPO

## 2014-06-21 ENCOUNTER — Encounter (HOSPITAL_COMMUNITY)
Admission: RE | Admit: 2014-06-21 | Discharge: 2014-06-21 | Disposition: A | Discharge status: Home / Self Care | Payer: BC Managed Care – PPO | Source: Ambulatory Visit | Attending: Adult Health | Admitting: Adult Health

## 2014-06-21 ENCOUNTER — Encounter (HOSPITAL_COMMUNITY): Payer: Self-pay

## 2014-06-21 ENCOUNTER — Encounter (HOSPITAL_COMMUNITY): Payer: BC Managed Care – PPO

## 2014-06-21 ENCOUNTER — Ambulatory Visit (HOSPITAL_COMMUNITY)
Admission: RE | Admit: 2014-06-21 | Discharge: 2014-06-21 | Disposition: A | Discharge status: Home / Self Care | Payer: BC Managed Care – PPO | Source: Ambulatory Visit | Attending: Adult Health | Admitting: Adult Health

## 2014-06-21 ENCOUNTER — Encounter (HOSPITAL_COMMUNITY)
Admission: RE | Admit: 2014-06-21 | Discharge: 2014-06-21 | Disposition: A | Discharge status: Home / Self Care | Payer: BC Managed Care – PPO | Source: Ambulatory Visit | Attending: Cardiovascular Disease | Admitting: Cardiovascular Disease

## 2014-06-21 DIAGNOSIS — I471 Supraventricular tachycardia: Secondary | ICD-10-CM | POA: Diagnosis not present

## 2014-06-21 DIAGNOSIS — R6884 Jaw pain: Secondary | ICD-10-CM | POA: Insufficient documentation

## 2014-06-21 DIAGNOSIS — R079 Chest pain, unspecified: Secondary | ICD-10-CM | POA: Insufficient documentation

## 2014-06-21 DIAGNOSIS — R002 Palpitations: Secondary | ICD-10-CM

## 2014-06-21 DIAGNOSIS — R072 Precordial pain: Secondary | ICD-10-CM

## 2014-06-21 HISTORY — DX: Unspecified asthma, uncomplicated: J45.909

## 2014-06-21 MED ORDER — SODIUM CHLORIDE 0.9 % IJ SOLN
INTRAMUSCULAR | Status: AC
  Administered 2014-06-21: 10 mL via INTRAVENOUS
  Filled 2014-06-21: qty 10

## 2014-06-21 MED ORDER — REGADENOSON 0.4 MG/5ML IV SOLN
INTRAVENOUS | Status: AC
  Administered 2014-06-21: 0.4 mg via INTRAVENOUS
  Filled 2014-06-21: qty 5

## 2014-06-21 MED ORDER — TECHNETIUM TC 99M SESTAMIBI GENERIC - CARDIOLITE
30.0000 | Freq: Once | INTRAVENOUS | Status: AC | PRN
  Administered 2014-06-21: 31 via INTRAVENOUS

## 2014-06-21 MED ORDER — TECHNETIUM TC 99M SESTAMIBI - CARDIOLITE
10.0000 | Freq: Once | INTRAVENOUS | Status: AC | PRN
  Administered 2014-06-21: 08:00:00 10 via INTRAVENOUS

## 2014-06-21 MED ORDER — REGADENOSON 0.4 MG/5ML IV SOLN
0.4000 mg | Freq: Once | INTRAVENOUS | Status: AC | PRN
  Administered 2014-06-21: 0.4 mg via INTRAVENOUS

## 2014-06-21 MED ORDER — SODIUM CHLORIDE 0.9 % IJ SOLN
10.0000 mL | INTRAMUSCULAR | Status: DC | PRN
  Administered 2014-06-21: 10 mL via INTRAVENOUS

## 2014-06-21 NOTE — Progress Notes (Signed)
Stress Lab Nurses Notes - Debbie Allen  Debbie Allen 06/21/2014 Reason for doing test: Chest Pain and PSVT Type of test: Wille Glaser Nurse performing test: Gerrit Halls, RN Nuclear Medicine Tech: Melburn Hake Echo Tech: Not Applicable MD performing test: Konreswaran/M.Bonnell Public PA Family MD: Dr. Luan Pulling Test explained and consent signed: Yes.   IV started: Saline lock flushed, No redness or edema and Saline lock started in radiology Symptoms: chest discomfort & dizziness Treatment/Intervention: None Reason test stopped: protocol completed After recovery IV was: Discontinued via X-ray tech and No redness or edema Patient to return to Nuc. Med at : 10:15 Patient discharged: Home Patient's Condition upon discharge was: stable Comments: During test BP 115//83 & HR 109.  Recovery BP 118/81 & HR 91.  Symptoms resolved in recovery. [name] T

## 2014-07-06 ENCOUNTER — Ambulatory Visit (INDEPENDENT_AMBULATORY_CARE_PROVIDER_SITE_OTHER): Payer: BC Managed Care – PPO | Admitting: Cardiology

## 2014-07-06 ENCOUNTER — Encounter: Payer: Self-pay | Admitting: Cardiology

## 2014-07-06 VITALS — BP 116/70 | HR 61 | Ht 63.0 in | Wt 167.0 lb

## 2014-07-06 DIAGNOSIS — Q245 Malformation of coronary vessels: Secondary | ICD-10-CM | POA: Insufficient documentation

## 2014-07-06 DIAGNOSIS — I351 Nonrheumatic aortic (valve) insufficiency: Secondary | ICD-10-CM

## 2014-07-06 DIAGNOSIS — R072 Precordial pain: Secondary | ICD-10-CM

## 2014-07-06 NOTE — Patient Instructions (Signed)
Your physician wants you to follow-up in: 6 months You will receive a reminder letter in the mail two months in advance. If you don't receive a letter, please call our office to schedule the follow-up appointment.     Your physician recommends that you continue on your current medications as directed. Please refer to the Current Medication list given to you today.      Thank you for choosing [facility] !        

## 2014-07-06 NOTE — Assessment & Plan Note (Signed)
High origin on the right coronary cusp, not high risk anatomy. Cardiolite study is negative for ischemia.

## 2014-07-06 NOTE — Progress Notes (Signed)
Reason for visit: PSVT, chest pain, follow-up testing  Clinical Summary Ms. Melott is a 48 y.o.female seen recently by Ms. Lawrence NP earlier in October. I reviewed her note. She comes back to follow-up on stress testing that was arranged.  Lexiscan Cardiolite done on October 15 was negative for ischemia with LVEF 51%, low risk study. I reviewed the results with her. She states that her chest pain symptoms have been improving, her stress level is also down. 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 She has also not been exercising as regularly. I was able to go over her previous heart catheterization report which is outlined below, and we discussed the results. She did not have any high risk anomalous coronary anatomy.  Cardiac catheterization report reviewed from 2008. 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  Allergies  Allergen Reactions  . Codeine     Current Outpatient Prescriptions  Medication Sig Dispense Refill  . albuterol (PROVENTIL HFA;VENTOLIN HFA) 108 (90 BASE) MCG/ACT inhaler Inhale 2 puffs into the lungs every 6 (six) hours as needed for wheezing or shortness of breath.      . budesonide-formoterol (SYMBICORT) 160-4.5 MCG/ACT inhaler Inhale 2 puffs into the lungs 2 (two) times daily as needed.      Marland Kitchen levocetirizine (XYZAL) 5 MG tablet Take 5 mg by mouth daily.      . metoprolol tartrate (LOPRESSOR) 25 MG tablet Take 1 tablet (25 mg total) by mouth 2 (two) times daily as needed.  180 tablet  1  . montelukast (SINGULAIR) 10 MG tablet Take 1 tablet by mouth daily.      . Olopatadine HCl 0.6 % SOLN Place into the nose as needed.      . pantoprazole (PROTONIX) 40 MG tablet Take 1 tablet (40 mg total) by mouth daily.  30 tablet  11  . pravastatin (PRAVACHOL) 40 MG  tablet Take 1 tablet (40 mg total) by mouth every evening.  90 tablet  3  . verapamil (VERELAN PM) 240 MG 24 hr capsule TAKE ONE CAPSULE BY MOUTH ONCE DAILY  30 capsule  5   No current facility-administered medications for this visit.    Past Medical History  Diagnosis Date  . Aortic regurgitation     Asymptomatic  . PSVT (paroxysmal supraventricular tachycardia)     Palpitations - suspected diagnosis (LGL postulated by Dr. Debara Pickett but never documented)  . Acid reflux   . 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 Asthma     History reviewed. No pertinent past surgical history.  Family History  Problem Relation Age of Onset  . Heart disease Father 15  . Hyperlipidemia Father   . Hyperlipidemia Sister   . Hypertension Paternal Grandmother   . Heart disease Paternal Grandfather   . Heart attack Paternal Grandfather     Social History Ms. Bodie reports that she has never smoked. She has never used smokeless tobacco. Ms. Manalang reports that she drinks alcohol.  Review of Systems Complete review of systems negative except as otherwise outlined in the clinical summary and also the following. No recent palpitations or syncope.  Physical Examination Filed Vitals:   07/06/14 0824  BP: 116/70  Pulse: 61   Filed Weights  07/06/14 0824  Weight: 167 lb (75.751 kg)    Patient appears comfortable at rest.  HEENT: Conjunctiva and lids normal, oropharynx clear.  Neck: Supple, no elevated JVP or carotid bruits, no thyromegaly.  Lungs: Clear to auscultation, nonlabored breathing at rest.  Cardiac: Regular rate and rhythm, no S3 with 7-9/0 systolic murmur, very soft diastolic murmur, no pericardial rub.  Abdomen: Soft, nontender, bowel sounds present.  Extremities: No pitting edema, distal pulses 2+.    Problem List and Plan   Anomalous origin of right coronary artery High origin on the  right coronary cusp, not high risk anatomy. Cardiolite study is negative for ischemia.  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 Her symptoms are better with improving stress. Cardiolite study shows no focal ischemia. Plan to continue verapamil. I recommended a basic walking regimen as well.  Aortic regurgitation Asymptomatic, mild to moderate by echocardiogram last year.    Satira Sark, M.D., F.A.C.C.

## 2014-07-06 NOTE — Assessment & Plan Note (Signed)
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 Her symptoms are better with improving stress. Cardiolite study shows no focal ischemia. Plan to continue verapamil. I recommended a basic walking regimen as well.

## 2014-07-06 NOTE — Assessment & Plan Note (Signed)
Asymptomatic, mild to moderate by echocardiogram last year.

## 2014-08-27 ENCOUNTER — Other Ambulatory Visit: Payer: Self-pay | Admitting: Internal Medicine

## 2014-09-03 ENCOUNTER — Other Ambulatory Visit: Payer: Self-pay | Admitting: Internal Medicine

## 2014-09-04 MED ORDER — METOPROLOL TARTRATE 25 MG PO TABS: 25.0000 mg | ORAL_TABLET | Freq: Two times a day (BID) | ORAL | Status: DC | PRN

## 2014-09-04 NOTE — Addendum Note (Signed)
Addended by: Julian Hy T on: 09/04/2014 08:13 AM   Modules accepted: Orders

## 2014-09-04 NOTE — Telephone Encounter (Signed)
Rx(s) sent to pharmacy electronically.  

## 2014-09-11 ENCOUNTER — Other Ambulatory Visit: Payer: Self-pay | Admitting: Internal Medicine

## 2014-09-11 NOTE — Telephone Encounter (Signed)
Rx(s) sent to pharmacy electronically.  

## 2014-11-26 ENCOUNTER — Other Ambulatory Visit: Payer: Self-pay | Admitting: Cardiology

## 2014-11-26 MED ORDER — PRAVASTATIN SODIUM 40 MG PO TABS: 40.0000 mg | ORAL_TABLET | Freq: Every evening | ORAL | Status: DC

## 2014-11-26 NOTE — Telephone Encounter (Signed)
Received fax refill request  Rx # [id number] Medication:  Pravastatin 40 mg tab Qty 90 Sig:  Take one tablet by mouth once daily in the evening Physician:  Domenic Polite

## 2014-11-26 NOTE — Telephone Encounter (Signed)
escribed refill 

## 2014-12-04 ENCOUNTER — Other Ambulatory Visit: Payer: Self-pay | Admitting: Internal Medicine

## 2014-12-07 ENCOUNTER — Other Ambulatory Visit: Payer: Self-pay

## 2014-12-07 ENCOUNTER — Other Ambulatory Visit: Payer: Self-pay | Admitting: Cardiology

## 2014-12-07 MED ORDER — PANTOPRAZOLE SODIUM 40 MG PO TBEC: 40.0000 mg | DELAYED_RELEASE_TABLET | Freq: Every day | ORAL | Status: DC

## 2014-12-07 NOTE — Telephone Encounter (Signed)
Received fax refill request  Rx # [id number] Medication:  Pantoprazole Sod 40 mg tab Qty 30 Sig:  Take one tablet by mouth once daily Physician:  Domenic Polite

## 2014-12-07 NOTE — Telephone Encounter (Signed)
Refill complete 

## 2014-12-11 NOTE — Telephone Encounter (Signed)
Received fax refill request  Rx # [id number] Medication:  PANTOPRAZOLE SOD 40MG  TAB Qty 30 Sig:  TAKE ONE TABLET BY MOUTH ONCE DAILY Physician:  Rozann Lesches

## 2014-12-12 NOTE — Telephone Encounter (Signed)
Refill confirmed 4/1 by pharmacy

## 2015-01-16 ENCOUNTER — Other Ambulatory Visit: Payer: Self-pay | Admitting: Cardiology

## 2015-01-16 MED ORDER — VERAPAMIL HCL ER 240 MG PO CP24: 240.0000 mg | ORAL_CAPSULE | Freq: Every day | ORAL | Status: DC

## 2015-01-16 NOTE — Telephone Encounter (Signed)
Received fax refill request  Rx # [id number] Medication:  Verapamil 240 mg ER cap Qty 30 Sig:  Take one capsule by mouth once daily Physician:  Domenic Polite

## 2015-01-16 NOTE — Telephone Encounter (Signed)
Refill complete 

## 2015-04-25 ENCOUNTER — Other Ambulatory Visit (INDEPENDENT_AMBULATORY_CARE_PROVIDER_SITE_OTHER): Payer: Self-pay | Admitting: Otolaryngology

## 2015-04-25 DIAGNOSIS — R42 Dizziness and giddiness: Secondary | ICD-10-CM

## 2015-04-26 ENCOUNTER — Ambulatory Visit: Payer: BC Managed Care – PPO | Admitting: Cardiology

## 2015-04-29 ENCOUNTER — Ambulatory Visit
Admission: RE | Admit: 2015-04-29 | Discharge: 2015-04-29 | Disposition: A | Discharge status: Home / Self Care | Payer: BC Managed Care – PPO | Source: Ambulatory Visit | Attending: Otolaryngology | Admitting: Otolaryngology

## 2015-04-29 DIAGNOSIS — R42 Dizziness and giddiness: Secondary | ICD-10-CM

## 2015-04-29 MED ORDER — GADOBENATE DIMEGLUMINE 529 MG/ML IV SOLN
16.0000 mL | Freq: Once | INTRAVENOUS | Status: AC | PRN
  Administered 2015-04-29: 16 mL via INTRAVENOUS

## 2015-05-06 ENCOUNTER — Other Ambulatory Visit (HOSPITAL_COMMUNITY): Payer: BC Managed Care – PPO

## 2015-06-27 ENCOUNTER — Ambulatory Visit: Payer: BC Managed Care – PPO | Admitting: Cardiology

## 2015-07-03 ENCOUNTER — Ambulatory Visit (INDEPENDENT_AMBULATORY_CARE_PROVIDER_SITE_OTHER): Payer: BC Managed Care – PPO | Admitting: Cardiology

## 2015-07-03 ENCOUNTER — Encounter: Payer: Self-pay | Admitting: Cardiology

## 2015-07-03 VITALS — BP 126/80 | HR 84 | Ht 63.0 in | Wt 169.2 lb

## 2015-07-03 DIAGNOSIS — Z23 Encounter for immunization: Secondary | ICD-10-CM

## 2015-07-03 DIAGNOSIS — I351 Nonrheumatic aortic (valve) insufficiency: Secondary | ICD-10-CM

## 2015-07-03 DIAGNOSIS — R002 Palpitations: Secondary | ICD-10-CM | POA: Diagnosis not present

## 2015-07-03 DIAGNOSIS — I471 Supraventricular tachycardia: Secondary | ICD-10-CM

## 2015-07-03 DIAGNOSIS — Q245 Malformation of coronary vessels: Secondary | ICD-10-CM

## 2015-07-03 NOTE — Patient Instructions (Signed)
Your physician wants you to follow-up in: 1 year. You will receive a reminder letter in the mail two months in advance. If you don't receive a letter, please call our office to schedule the follow-up appointment.  Your physician recommends that you continue on your current medications as directed. Please refer to the Current Medication list given to you today.  Your physician has requested that you have an echocardiogram. Echocardiography is a painless test that uses sound waves to create images of your heart. It provides your doctor with information about the size and shape of your heart and how well your heart's chambers and valves are working. This procedure takes approximately one hour. There are no restrictions for this procedure.  If you need a refill on your cardiac medications before your next appointment, please call your pharmacy.  Thank you for choosing [facility]!

## 2015-07-03 NOTE — Progress Notes (Signed)
Cardiology Office Note  Date: 07/03/2015   ID: NAKAYA MISHKIN, DOB 1966-07-05, MRN [id number]  PCP: Alonza Bogus, MD  Primary Cardiologist: Rozann Lesches, MD   Chief Complaint  Patient presents with  . Aortic regurgitation  . History of PSVT    History of Present Illness: Debbie Allen is a 49 y.o. female last seen in October 2015. She presents for a routine follow-up visit, missed her 6 month appointment. 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 Subsequent evaluation suggests an area benign calcification rather than tumor based on discussion with her today. She is following with a neurosurgeon.  From a cardiac perspective, she reports that she has done very well without any significant palpitations over several months. Her stress level has been better. 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 Follow-up stress testing from last year was reassuring.  ECG today shows sinus bradycardia with short PR. We reviewed her medications which are outlined below. She continues on verapamil and Lopressor.  She has been doing some walking for exercise, reports NYHA class II dyspnea typically. Echocardiogram from 2014 is reviewed below as well.  Past Medical History  Diagnosis Date  . Aortic regurgitation     Asymptomatic  . PSVT (paroxysmal supraventricular tachycardia) (HCC)     Palpitations - suspected diagnosis (LGL postulated by Dr. Debara Pickett but never documented)  . Acid reflux   . 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 Asthma      History reviewed. No pertinent past surgical history.  Current Outpatient Prescriptions  Medication Sig Dispense Refill  . albuterol (PROVENTIL HFA;VENTOLIN HFA) 108 (90 BASE) MCG/ACT inhaler Inhale 2 puffs into the lungs every 6 (six) hours as needed for wheezing or shortness of breath.    . budesonide-formoterol (SYMBICORT) 160-4.5 MCG/ACT inhaler Inhale 2 puffs into the lungs 2 (two) times daily as needed.    Marland Kitchen levocetirizine (XYZAL) 5 MG tablet Take 5 mg by mouth daily.    . metoprolol tartrate (LOPRESSOR) 25 MG tablet Take 1 tablet (25 mg total) by mouth 2 (two) times daily as needed. 180 tablet 1  . montelukast (SINGULAIR) 10 MG tablet Take 1 tablet by mouth daily.    . Olopatadine HCl 0.6 % SOLN Place into the nose as needed.    . pantoprazole (PROTONIX) 40 MG tablet Take 1 tablet (40 mg total) by mouth daily. 30 tablet 6  . pravastatin (PRAVACHOL) 40 MG tablet Take 1 tablet (40 mg total) by mouth every evening. 90 tablet 3  . verapamil (VERELAN PM) 240 MG 24 hr capsule Take 1 capsule (240 mg total) by mouth daily. 30 capsule 6   No current facility-administered medications for this visit.    Allergies:  Codeine   Social History: The patient  reports that she has never smoked. She has never used smokeless tobacco. She reports that she drinks alcohol. She reports that she does not use illicit drugs.   ROS:  Please see the history of present illness. Otherwise, complete review of systems is positive for none.  All other systems are reviewed and negative.  Physical Exam: VS:  BP 126/80 mmHg  Pulse 84  Ht 5\' 3"  (1.6 m)  Wt 169 lb 3.2 oz (76.749 kg)  BMI 29.98 kg/m2  SpO2 97%, BMI Body mass index is 29.98 kg/(m^2).  Wt Readings from Last 3 Encounters:  07/03/15 169 lb 3.2 oz (76.749 kg)  04/29/15 172 lb (78.019 kg)  07/06/14 167 lb (75.751 kg)     Patient appears comfortable at rest.  HEENT: Conjunctiva and lids normal, oropharynx clear.  Neck: Supple, no elevated  JVP or carotid bruits, no thyromegaly.  Lungs: Clear to auscultation, nonlabored breathing at rest.  Cardiac: Regular rate and rhythm, no S3 with 1/6 systolic murmur, 2/6 diastolic murmur at the left base, no pericardial rub.  Abdomen: Soft, nontender, bowel sounds present.  Extremities: No pitting edema, distal pulses 2+.  Skin: Warm and dry. Musculoskeletal: No kyphosis. Neuropsychiatric: Alert and oriented 3, affect appropriate.   ECG: ECG is ordered today.  Recent Labwork: No results found for requested labs within last 365 days.     Component Value Date/Time   CHOL 186 06/16/2014 1109   TRIG 154* 06/16/2014 1109   HDL 49 06/16/2014 1109   CHOLHDL 3.8 06/16/2014 1109   VLDL 31 06/16/2014 1109   LDLCALC 106* 06/16/2014 1109    Other Studies Reviewed Today:  Echocardiogram 07/04/2013: Study Conclusions  - Left ventricle: The cavity size was normal. Systolic function was normal. Wall motion was normal; there were no regional wall motion abnormalities. - Aortic valve: Mild to moderate regurgitation directed centrally in the LVOT. - Pulmonary arteries: PA peak pressure: 72mm Hg (S).  Lexiscan Cardiolite 06/21/2014: FINDINGS: Stress/ECG data: The patient was stressed according to the Lexiscan protocol. The heart rate ranged from 69 to 115 beats per min. The blood pressure averaged 131/88. The patient experienced mild chest tightness with Lexiscan infusion.  Resting ECG demonstrated normal sinus rhythm. With stress, there were no ischemic ST segment or T-wave abnormalities, nor any arrhythmias.  Perfusion: No decreased activity in the left ventricle on stress imaging to suggest reversible ischemia or infarction.  Wall Motion: Normal left ventricular wall motion. No left ventricular dilation.  Left Ventricular Ejection Fraction: 51 %  End diastolic volume 68 ml  End systolic volume 33 ml  IMPRESSION: 1. No reversible ischemia or  infarction.  2. Normal left ventricular wall motion.  3. Left ventricular ejection fraction 51%  4. Low-risk stress test findings*.  ASSESSMENT AND PLAN:  1. History of palpitations and suspected PSVT. She has a short PR interval by ECG. She has not had any symptoms of significance over the last year, and continues on beta blocker and calcium channel blocker. Plan is to continue observation for now.  2. Aortic regurgitation, mild to moderate by echocardiogram 2 years ago. Diastolic murmur is more prominent but she is not clearly symptomatic. We will obtain a follow-up echocardiogram.  3. 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 Cardiolite study from last year was negative for ischemia.  Current medicines were reviewed at length with the patient today.   Orders Placed This Encounter  Procedures  . Flu Vaccine QUAD 36+ mos IM  . EKG 12-Lead  . Echocardiogram    Disposition: FU with me in 1 year.   Signed, Satira Sark, MD, [facility] 07/03/2015 5:09 PM    [facility] at [facility] [address] [place], [place] [address] Phone: [phone]; Fax: [phone]

## 2015-07-05 ENCOUNTER — Ambulatory Visit (HOSPITAL_COMMUNITY)
Admission: RE | Admit: 2015-07-05 | Discharge: 2015-07-05 | Disposition: A | Discharge status: Home / Self Care | Payer: BC Managed Care – PPO | Source: Ambulatory Visit | Attending: Cardiology | Admitting: Cardiology

## 2015-07-05 DIAGNOSIS — I351 Nonrheumatic aortic (valve) insufficiency: Secondary | ICD-10-CM | POA: Diagnosis not present

## 2015-08-26 ENCOUNTER — Other Ambulatory Visit: Payer: Self-pay | Admitting: Cardiology

## 2015-11-26 ENCOUNTER — Other Ambulatory Visit: Payer: Self-pay | Admitting: Cardiology

## 2016-05-15 ENCOUNTER — Encounter (HOSPITAL_COMMUNITY): Payer: Self-pay

## 2016-05-15 ENCOUNTER — Emergency Department (HOSPITAL_COMMUNITY): Payer: BC Managed Care – PPO

## 2016-05-15 ENCOUNTER — Emergency Department (HOSPITAL_COMMUNITY)
Admission: EM | Admit: 2016-05-15 | Discharge: 2016-05-15 | Disposition: A | Discharge status: Home / Self Care | Payer: BC Managed Care – PPO | Attending: Emergency Medicine | Admitting: Emergency Medicine

## 2016-05-15 DIAGNOSIS — R200 Anesthesia of skin: Secondary | ICD-10-CM | POA: Diagnosis present

## 2016-05-15 DIAGNOSIS — Z79899 Other long term (current) drug therapy: Secondary | ICD-10-CM | POA: Insufficient documentation

## 2016-05-15 DIAGNOSIS — J45909 Unspecified asthma, uncomplicated: Secondary | ICD-10-CM | POA: Insufficient documentation

## 2016-05-15 HISTORY — DX: Benign neoplasm of brain, unspecified: D33.2

## 2016-05-15 LAB — BASIC METABOLIC PANEL
Anion gap: 4 — ABNORMAL LOW (ref 5–15)
BUN: 11 mg/dL (ref 6–20)
CO2: 28 mmol/L (ref 22–32)
Calcium: 8.7 mg/dL — ABNORMAL LOW (ref 8.9–10.3)
Chloride: 106 mmol/L (ref 101–111)
Creatinine, Ser: 0.74 mg/dL (ref 0.44–1.00)
GFR calc Af Amer: >60 mL/min (ref >60)
GFR calc non Af Amer: >60 mL/min (ref >60)
Glucose, Bld: 97 mg/dL (ref 65–99)
Potassium: 3.2 mmol/L — ABNORMAL LOW (ref 3.5–5.1)
Sodium: 138 mmol/L (ref 135–145)

## 2016-05-15 LAB — CBC WITH DIFFERENTIAL/PLATELET
Basophils Absolute: 0 10*3/uL (ref 0.0–0.1)
Basophils Relative: 1 %
Eosinophils Absolute: 0.2 10*3/uL (ref 0.0–0.7)
Eosinophils Relative: 4 %
HCT: 38.7 % (ref 36.0–46.0)
Hemoglobin: 12.8 g/dL (ref 12.0–15.0)
Lymphocytes Relative: 32 %
Lymphs Abs: 2.1 10*3/uL (ref 0.7–4.0)
MCH: 27.7 pg (ref 26.0–34.0)
MCHC: 33.1 g/dL (ref 30.0–36.0)
MCV: 83.8 fL (ref 78.0–100.0)
Monocytes Absolute: 0.6 10*3/uL (ref 0.1–1.0)
Monocytes Relative: 10 %
Neutro Abs: 3.5 10*3/uL (ref 1.7–7.7)
Neutrophils Relative %: 53 %
Platelets: 329 10*3/uL (ref 150–400)
RBC: 4.62 MIL/uL (ref 3.87–5.11)
RDW: 13.4 % (ref 11.5–15.5)
WBC: 6.5 10*3/uL (ref 4.0–10.5)

## 2016-05-15 LAB — TSH: TSH: 2.466 u[IU]/mL (ref 0.350–4.500)

## 2016-05-15 LAB — TROPONIN I: Troponin I: <0.03 ng/mL (ref <0.03)

## 2016-05-15 NOTE — ED Triage Notes (Addendum)
Patient sent here by Dr Domenic Polite for intermittent numbness and drooping of left side of lip. Patient states started today at 10:30, lasting briefly. Per patient thought it could be due to allergic reaction but has continued to come and go. Patient states that she had a migraine headache yesterday but nothing today. No other neurological deficits noted. Denies any dizziness. Dr Lacinda Axon made aware and in triage to assess patient.

## 2016-05-15 NOTE — Discharge Instructions (Signed)
Tests were good.  Google Bell's palsy when you get home. Be vigilant to any of the symptoms. Return for any concerns.

## 2016-05-15 NOTE — ED Triage Notes (Signed)
No code stroke per Dr Lacinda Axon.

## 2016-05-15 NOTE — ED Triage Notes (Signed)
Patient now reports intermittent chest pain/pressure for last few days. Denies any shortness of breath.

## 2016-05-15 NOTE — ED Provider Notes (Signed)
[facility] DEPT Provider Note   CSN: [id number] Arrival date & time: 05/15/16  1455     History   Chief Complaint Chief Complaint  Patient presents with  . Numbness    HPI Debbie Allen is a 50 y.o. female.   intermittent numbness in the left lower lip since approximately 10:30 this morning. Symptoms last for a brief period of time. No associated confusion, neck stiffness, extremity weakness, slurred speech, fever, sweats, chills.  No facial numbness, change in smell or taste or hearing, eyelid weakness.  This has never happened before.  HPI  Past Medical History:  Diagnosis Date  . Acid reflux   . Aortic regurgitation    Asymptomatic  . Asthma   . Brain tumor (benign) ([facility])   . 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 PSVT (paroxysmal supraventricular tachycardia) (HCC)    Palpitations - suspected diagnosis (LGL postulated by Dr. Debara Pickett but never documented)    Patient Active Problem List   Diagnosis Date Noted  . 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 Chest pain 06/15/2014  . Aortic regurgitation 04/16/2014  . Palpitations 10/19/2013  . PSVT (paroxysmal supraventricular tachycardia) ([place]) 10/19/2013  . Dyslipidemia 10/19/2013    History reviewed. No pertinent surgical history.  OB History    Gravida Para Term Preterm AB Living   2 2 2     2    SAB TAB Ectopic Multiple Live Births                   Home Medications    Prior to Admission medications   Medication Sig Start Date End Date Taking? Authorizing Provider  albuterol (PROVENTIL HFA;VENTOLIN HFA) 108 (90 BASE) MCG/ACT inhaler Inhale 2 puffs into the lungs every 6 (six) hours as needed for wheezing or shortness of breath.    Historical Provider, MD  budesonide-formoterol (SYMBICORT) 160-4.5 MCG/ACT inhaler Inhale 2 puffs into the lungs 2 (two) times daily as needed.    Historical Provider, MD    levocetirizine (XYZAL) 5 MG tablet Take 5 mg by mouth daily. 10/01/13   Historical Provider, MD  metoprolol tartrate (LOPRESSOR) 25 MG tablet Take 1 tablet (25 mg total) by mouth 2 (two) times daily as needed. 09/04/14   Satira Sark, MD  montelukast (SINGULAIR) 10 MG tablet Take 1 tablet by mouth daily. 10/01/13   Historical Provider, MD  Olopatadine HCl 0.6 % SOLN Place into the nose as needed.    Historical Provider, MD  pantoprazole (PROTONIX) 40 MG tablet Take 1 tablet (40 mg total) by mouth daily. 12/07/14   Satira Sark, MD  pravastatin (PRAVACHOL) 40 MG tablet TAKE ONE TABLET BY MOUTH ONCE DAILY IN THE EVENING 11/27/15   Satira Sark, MD  verapamil (VERELAN PM) 240 MG 24 hr capsule TAKE ONE CAPSULE BY MOUTH ONCE DAILY 08/26/15   Satira Sark, MD    Family History Family History  Problem Relation Age of Onset  . Heart disease Father 44  . Hyperlipidemia Father   . Hyperlipidemia Sister   . Hypertension Paternal Grandmother   . Heart disease Paternal Grandfather   . Heart attack Paternal Grandfather     Social History Social History  Substance Use Topics  . Smoking status: Never Smoker  . Smokeless tobacco: Never Used  . Alcohol use 0.0 oz/week     Comment: Occasional     Allergies  Codeine   Review of Systems Review of Systems  All other systems reviewed and are negative.    Physical Exam Updated Vital Signs BP 157/90 (BP Location: Left Arm)   Pulse 67   Temp 98.1 F (36.7 C) (Oral)   Resp 20   SpO2 99%   Physical Exam  Constitutional: She is oriented to person, place, and time. She appears well-developed and well-nourished.  HENT:  Head: Normocephalic and atraumatic.  Eyes: Conjunctivae are normal.  Neck: Neck supple.  Cardiovascular: Normal rate and regular rhythm.   Pulmonary/Chest: Effort normal and breath sounds normal.  Abdominal: Soft. Bowel sounds are normal.  Musculoskeletal: Normal range of motion.  Neurological: She is  alert and oriented to person, place, and time.  Skin: Skin is warm and dry.  Psychiatric: She has a normal mood and affect. Her behavior is normal.  Nursing note and vitals reviewed.    ED Treatments / Results  Labs (all labs ordered are listed, but only abnormal results are displayed) Labs Reviewed  BASIC METABOLIC PANEL - Abnormal; Notable for the following:       Result Value   Potassium 3.2 (*)    Calcium 8.7 (*)    Anion gap 4 (*)    All other components within normal limits  CBC WITH DIFFERENTIAL/PLATELET  TSH  TROPONIN I    EKG  EKG Interpretation None       Radiology Dg Chest 2 View  Result Date: 05/15/2016 CLINICAL DATA:  50 y/o F; left-sided chest pressure and left-sided lip and tongue numbness that lasted for a few seconds for 1 day. EXAM: CHEST  2 VIEW COMPARISON:  05/13/2006 chest radiograph. FINDINGS: Stable cardiomediastinal silhouette. Stable moderate S-shaped curvature of the thoracolumbar spine. Clear lungs. No pneumothorax. No pleural effusion identified. No acute osseous abnormality. Chronic appearing mild anterior compression deformity, probably of T10. IMPRESSION: No active cardiopulmonary disease. Electronically Signed   By: Kristine Garbe M.D.   On: 05/15/2016 15:55    Procedures Procedures (including critical care time)  Medications Ordered in ED Medications - No data to display   Initial Impression / Assessment and Plan / ED Course  I have reviewed the triage vital signs and the nursing notes.  Pertinent labs & imaging results that were available during my care of the patient were reviewed by me and considered in my medical decision making (see chart for details).  Clinical Course    Patient has normal neurological exam. This could be early Bell's palsy. Discussed with patient and her husband.  Final Clinical Impressions(s) / ED Diagnoses   Final diagnoses:  Numbness    New Prescriptions New Prescriptions   No medications  on file  I   Nat Christen, MD 05/15/16 [id number]

## 2016-06-22 ENCOUNTER — Encounter: Payer: Self-pay | Admitting: Cardiology

## 2016-06-22 ENCOUNTER — Ambulatory Visit (INDEPENDENT_AMBULATORY_CARE_PROVIDER_SITE_OTHER): Payer: BC Managed Care – PPO | Admitting: Cardiology

## 2016-06-22 VITALS — BP 116/68 | HR 68 | Ht 63.0 in | Wt 166.0 lb

## 2016-06-22 DIAGNOSIS — I351 Nonrheumatic aortic (valve) insufficiency: Secondary | ICD-10-CM

## 2016-06-22 DIAGNOSIS — Q245 Malformation of coronary vessels: Secondary | ICD-10-CM

## 2016-06-22 DIAGNOSIS — Z23 Encounter for immunization: Secondary | ICD-10-CM | POA: Diagnosis not present

## 2016-06-22 DIAGNOSIS — I471 Supraventricular tachycardia: Secondary | ICD-10-CM

## 2016-06-22 NOTE — Patient Instructions (Signed)
Your physician wants you to follow-up in: 1 year You will receive a reminder letter in the mail two months in advance. If you don't receive a letter, please call our office to schedule the follow-up appointment.    Your physician recommends that you continue on your current medications as directed. Please refer to the Current Medication list given to you today.     Thank you for choosing [facility] !  

## 2016-06-22 NOTE — Progress Notes (Signed)
Cardiology Office Note  Date: 06/22/2016   ID: Debbie Allen, DOB [birth year], MRN [id number]  PCP: Debbie Bogus, MD  Primary Cardiologist: Debbie Lesches, MD   Chief Complaint  Patient presents with  . PSVT    History of Present Illness: Debbie Allen is a 50 y.o. female last seen in October 2016. She presents for a routine follow-up visit. She continues to teach full time, second grade. Had increased stress at the beginning of the year and needed to use metoprolol more consistently, but has now been back to baseline without any significant palpitations.  We went over her medications, no changes noted in cardiac regimen.  She states that she may need to undergo right knee surgery in the next few months.  She had a follow-up echocardiogram and Myoview study within the last 2 years.  Past Medical History:  Diagnosis Date  . Acid reflux   . Aortic regurgitation    Asymptomatic  . Asthma   . Brain tumor (benign) ([place])   . 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 PSVT (paroxysmal supraventricular tachycardia) (HCC)    Palpitations - suspected diagnosis (LGL postulated by Dr. Debara Pickett but never documented)    Current Outpatient Prescriptions  Medication Sig Dispense Refill  . albuterol (PROVENTIL HFA;VENTOLIN HFA) 108 (90 BASE) MCG/ACT inhaler Inhale 2 puffs into the lungs every 6 (six) hours as needed for wheezing or shortness of breath.    . budesonide-formoterol (SYMBICORT) 160-4.5 MCG/ACT inhaler Inhale 2 puffs into the lungs 2 (two) times daily as needed.    Marland Kitchen levocetirizine (XYZAL) 5 MG tablet Take 5 mg by mouth daily.    . metoprolol tartrate (LOPRESSOR) 25 MG tablet Take 1 tablet (25 mg total) by mouth 2 (two) times daily as needed. 180 tablet 1  . montelukast (SINGULAIR) 10 MG tablet Take 1 tablet by mouth daily.    . Olopatadine HCl 0.6 % SOLN Place into the nose as needed.      . pantoprazole (PROTONIX) 40 MG tablet Take 1 tablet (40 mg total) by mouth daily. 30 tablet 6  . pravastatin (PRAVACHOL) 40 MG tablet TAKE ONE TABLET BY MOUTH ONCE DAILY IN THE EVENING 90 tablet 3  . verapamil (VERELAN PM) 240 MG 24 hr capsule TAKE ONE CAPSULE BY MOUTH ONCE DAILY 30 capsule 11   No current facility-administered medications for this visit.    Allergies:  Codeine   Social History: The patient  reports that she has never smoked. She has never used smokeless tobacco. She reports that she drinks alcohol. She reports that she does not use drugs.   ROS:  Please see the history of present illness. Otherwise, complete review of systems is positive for right knee pain and stiffness.  All other systems are reviewed and negative.   Physical Exam: VS:  BP 116/68   Pulse 68   Ht 5\' 3"  (1.6 m)   Wt 166 lb (75.3 kg)   SpO2 98%   BMI 29.41 kg/m , BMI Body mass index is 29.41 kg/m.  Wt Readings from Last 3 Encounters:  06/22/16 166 lb (75.3 kg)  07/03/15 169 lb 3.2 oz (76.7 kg)  04/29/15 172 lb (78 kg)    Patient appears comfortable at rest.  HEENT: Conjunctiva and lids normal, oropharynx clear.  Neck: Supple, no elevated JVP or carotid bruits, no thyromegaly.  Lungs: Clear to auscultation, nonlabored breathing at rest.  Cardiac: Regular rate and rhythm, no S3 with 1/6 systolic murmur, 2/6 diastolic murmur at the left base, no pericardial rub.  Abdomen: Soft, nontender, bowel sounds present.  Extremities: No pitting edema, distal pulses 2+.   ECG: I personally reviewed the tracing from 05/15/2016 which showed normal sinus rhythm.  Recent Labwork: 05/15/2016: BUN 11; Creatinine, Ser 0.74; Hemoglobin 12.8; Platelets 329; Potassium 3.2; Sodium 138; TSH 2.466     Component Value Date/Time   CHOL 186 06/16/2014 1109   TRIG 154 (H) 06/16/2014 1109   HDL 49 06/16/2014 1109   CHOLHDL 3.8 06/16/2014 1109   VLDL 31 06/16/2014 1109   LDLCALC 106 (H) 06/16/2014 1109    Other  Studies Reviewed Today:  Lexiscan Cardiolite 06/21/2014: FINDINGS: Stress/ECG data: The patient was stressed according to the Lexiscan protocol. The heart rate ranged from 69 to 115 beats per min. The blood pressure averaged 131/88. The patient experienced mild chest tightness with Lexiscan infusion.  Resting ECG demonstrated normal sinus rhythm. With stress, there were no ischemic ST segment or T-wave abnormalities, nor any arrhythmias.  Perfusion: No decreased activity in the left ventricle on stress imaging to suggest reversible ischemia or infarction.  Wall Motion: Normal left ventricular wall motion. No left ventricular dilation.  Left Ventricular Ejection Fraction: 51 %  End diastolic volume 68 ml  End systolic volume 33 ml  IMPRESSION: 1. No reversible ischemia or infarction.  2. Normal left ventricular wall motion.  3. Left ventricular ejection fraction 51%  4. Low-risk stress test findings*.  Echocardiogram 07/05/2015: Study Conclusions  - Left ventricle: The cavity size was normal. Wall thickness was   normal. Systolic function was normal. The estimated ejection   fraction was in the range of 60% to 65%. Wall motion was normal;   there were no regional wall motion abnormalities. Left   ventricular diastolic function parameters were normal. - Aortic valve: There was mild to moderate regurgitation.   Regurgitation pressure half-time: 848 ms. - Mitral valve: There was mild regurgitation. - Tricuspid valve: There was mild regurgitation.  Assessment and Plan:  1. PSVT, recently well-controlled on current regimen including verapamil with as needed Lopressor. We will continue observation. Recent ECG reviewed.  2. Mild to moderate aortic regurgitation by follow-up echocardiogram last October. No significant change on examination.  3. Low-risk Myoview in 2015 as outlined above. She has a history of anomalous origin of the RCA and intramyocardial  bridging of a septal perforator, no active symptoms.  Current medicines were reviewed with the patient today.  Disposition: Follow-up with me in one year.  Signed, Satira Sark, MD, [facility] 06/22/2016 4:25 PM    [facility] at [facility] [address], [place], [place] [address] Phone: [phone]; Fax: [phone]

## 2016-09-09 ENCOUNTER — Other Ambulatory Visit: Payer: Self-pay | Admitting: Cardiology

## 2016-11-23 ENCOUNTER — Other Ambulatory Visit: Payer: Self-pay | Admitting: Cardiology

## 2017-01-29 ENCOUNTER — Encounter: Payer: Self-pay | Admitting: Physician Assistant

## 2017-01-29 ENCOUNTER — Other Ambulatory Visit (HOSPITAL_COMMUNITY)
Admission: RE | Admit: 2017-01-29 | Discharge: 2017-01-29 | Disposition: A | Discharge status: Home / Self Care | Payer: BC Managed Care – PPO | Source: Ambulatory Visit | Attending: Physician Assistant | Admitting: Physician Assistant

## 2017-01-29 ENCOUNTER — Ambulatory Visit (INDEPENDENT_AMBULATORY_CARE_PROVIDER_SITE_OTHER): Payer: BC Managed Care – PPO | Admitting: Physician Assistant

## 2017-01-29 VITALS — BP 118/78 | HR 70 | Ht 63.0 in | Wt 170.0 lb

## 2017-01-29 DIAGNOSIS — Q245 Malformation of coronary vessels: Secondary | ICD-10-CM | POA: Diagnosis not present

## 2017-01-29 DIAGNOSIS — I351 Nonrheumatic aortic (valve) insufficiency: Secondary | ICD-10-CM | POA: Diagnosis not present

## 2017-01-29 DIAGNOSIS — I471 Supraventricular tachycardia: Secondary | ICD-10-CM | POA: Insufficient documentation

## 2017-01-29 DIAGNOSIS — R079 Chest pain, unspecified: Secondary | ICD-10-CM | POA: Diagnosis not present

## 2017-01-29 LAB — CBC WITH DIFFERENTIAL/PLATELET
Band Neutrophils: 0 %
Basophils Absolute: 0.1 10*3/uL (ref 0.0–0.1)
Basophils Relative: 1 %
Blasts: 0 %
Eosinophils Absolute: 0.5 10*3/uL (ref 0.0–0.7)
Eosinophils Relative: 6 %
HCT: 38.2 % (ref 36.0–46.0)
Hemoglobin: 12.8 g/dL (ref 12.0–15.0)
Lymphocytes Relative: 38 %
Lymphs Abs: 3 10*3/uL (ref 0.7–4.0)
MCH: 27.8 pg (ref 26.0–34.0)
MCHC: 33.5 g/dL (ref 30.0–36.0)
MCV: 82.9 fL (ref 78.0–100.0)
Metamyelocytes Relative: 0 %
Monocytes Absolute: 0.4 10*3/uL (ref 0.1–1.0)
Monocytes Relative: 5 %
Myelocytes: 0 %
Neutro Abs: 4 10*3/uL (ref 1.7–7.7)
Neutrophils Relative %: 50 %
Other: 0 %
Platelets: 330 10*3/uL (ref 150–400)
Promyelocytes Absolute: 0 %
RBC: 4.61 MIL/uL (ref 3.87–5.11)
RDW: 13.2 % (ref 11.5–15.5)
WBC: 8 10*3/uL (ref 4.0–10.5)
nRBC: 0 /100 WBC

## 2017-01-29 LAB — TSH: TSH: 2.945 u[IU]/mL (ref 0.350–4.500)

## 2017-01-29 LAB — BASIC METABOLIC PANEL
Anion gap: 8 (ref 5–15)
BUN: 13 mg/dL (ref 6–20)
CO2: 27 mmol/L (ref 22–32)
Calcium: 9.1 mg/dL (ref 8.9–10.3)
Chloride: 102 mmol/L (ref 101–111)
Creatinine, Ser: 0.7 mg/dL (ref 0.44–1.00)
GFR calc Af Amer: >60 mL/min (ref >60)
GFR calc non Af Amer: >60 mL/min (ref >60)
Glucose, Bld: 91 mg/dL (ref 65–99)
Potassium: 3.3 mmol/L — ABNORMAL LOW (ref 3.5–5.1)
Sodium: 137 mmol/L (ref 135–145)

## 2017-01-29 MED ORDER — ASPIRIN EC 81 MG PO TBEC: 81.0000 mg | DELAYED_RELEASE_TABLET | Freq: Every day | ORAL | 3 refills | Status: DC

## 2017-01-29 MED ORDER — METOPROLOL TARTRATE 25 MG PO TABS: 25.0000 mg | ORAL_TABLET | Freq: Two times a day (BID) | ORAL | 3 refills | Status: DC

## 2017-01-29 NOTE — Progress Notes (Signed)
Cardiology Office Note    Date:  01/29/2017  ID:  Debbie Allen, DOB 10/29/1965, MRN [id number] PCP:  Sinda Du, MD  Cardiologist:  Dr. Domenic Polite  Chief Complaint: chest pain  History of Present Illness:  Debbie Allen is a 51 y.o. female with history of mild-moderate aortic regurgitation, asthma, intramyocardial bridging by cath, sinus bradycardia, short PR interval, suspected PSVT who presents for evaluation of chest pain. Last echo 06/2015 showed EF 57-84%, normal diastolic function, mild-mod AI, mild MR/TR. Nuc 06/2014 was normal, EF 51%. 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 Last labs 2017 showed normal TSH, K 3.2, Cr 0.74, CBC wnl.  She returns for eval of chest pain. She reports history of intermittent chest pain over the years, usually occurring with exertion but with varying frequency and dependent on her level of fitness at the time. If it was the first time exercising in a while, the discomfort would tend to come on more rapidly and last longer. The more frequently she would exercise, the less prominent or frequent the discomfort would occur. It does not sound like it would cause her to have to stop. She has not exercised since Easter due stress at work. 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 On Friday, a particularly stressful day, she began to notice about an hours worth of left sided chest discomfort/tightness associated with left neck/jaw pain. She took an aspirin and metoprolol. Since that day she's had several more episodes so she began taking metoprolol sporadically. The last few days the discomfort has eased off and today she did not have any symptoms whatsoever, even though it was an active day at school with lots of walking. No dyspnea, nausea, diaphoresis, palpitations, syncope.    Past Medical History:  Diagnosis Date  . Acid  reflux   . Aortic regurgitation    Asymptomatic  . Asthma   . Brain tumor (benign) ([place])   . 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 PSVT (paroxysmal supraventricular tachycardia) (HCC)    Palpitations - suspected diagnosis (LGL postulated by Dr. Debara Pickett but never documented)    History reviewed. No pertinent surgical history.  Current Medications: Outpatient Medications Prior to Visit  Medication Sig Dispense Refill  . albuterol (PROVENTIL HFA;VENTOLIN HFA) 108 (90 BASE) MCG/ACT inhaler Inhale 2 puffs into the lungs every 6 (six) hours as needed for wheezing or shortness of breath.    . budesonide-formoterol (SYMBICORT) 160-4.5 MCG/ACT inhaler Inhale 2 puffs into the lungs 2 (two) times daily as needed.    Marland Kitchen levocetirizine (XYZAL) 5 MG tablet Take 5 mg by mouth daily.    . metoprolol tartrate (LOPRESSOR) 25 MG tablet Take 1 tablet (25 mg total) by mouth 2 (two) times daily as needed. 180 tablet 1  . montelukast (SINGULAIR) 10 MG tablet Take 1 tablet by mouth daily.    . Olopatadine HCl 0.6 % SOLN Place into the nose as needed.    . pantoprazole (PROTONIX) 40 MG tablet Take 1 tablet (40 mg total) by mouth daily. 30 tablet 6  . pravastatin (PRAVACHOL) 40 MG tablet TAKE ONE TABLET BY MOUTH ONCE DAILY IN THE EVENING 90 tablet 3  . verapamil (VERELAN PM) 240 MG 24 hr capsule TAKE ONE CAPSULE BY MOUTH ONCE DAILY 90 capsule 3   No facility-administered medications  prior to visit.      Allergies:   Codeine   Social History   Social History  . Marital status: Married    Spouse name: N/A  . Number of children: N/A  . Years of education: N/A   Social History Main Topics  . Smoking status: Never Smoker  . Smokeless tobacco: Never Used  . Alcohol use 0.0 oz/week     Comment: Occasional  . Drug use: No  . Sexual activity: Not Asked   Other Topics Concern  . None   Social History Narrative    . None     Family History:  Family History  Problem Relation Age of Onset  . Heart disease Father 41  . Hyperlipidemia Father   . Hyperlipidemia Sister   . Hypertension Paternal Grandmother   . Heart disease Paternal Grandfather   . Heart attack Paternal Grandfather     ROS:   Please see the history of present illness.  Takes PRN antivert for vertigo chronically/intermittently All other systems are reviewed and otherwise negative.    PHYSICAL EXAM:   VS:  BP 118/78   Pulse 70   Ht 5\' 3"  (1.6 m)   Wt 170 lb (77.1 kg)   SpO2 97%   BMI 30.11 kg/m   BMI: Body mass index is 30.11 kg/m. GEN: Well nourished, well developed WF, in no acute distress  HEENT: normocephalic, atraumatic Neck: no JVD, carotid bruits, or masses Cardiac: RRR; extremely soft diastolic murmur, no rubs or gallops, no edema  Respiratory:  clear to auscultation bilaterally, normal work of breathing GI: soft, nontender, nondistended, + BS MS: no deformity or atrophy  Skin: warm and dry, no rash Neuro:  Alert and Oriented x 3, Strength and sensation are intact, follows commands Psych: euthymic mood, full affect  Wt Readings from Last 3 Encounters:  01/29/17 170 lb (77.1 kg)  06/22/16 166 lb (75.3 kg)  07/03/15 169 lb 3.2 oz (76.7 kg)      Studies/Labs Reviewed:   EKG:  EKG was ordered today and personally reviewed by me and demonstrates NSR 66bpm short PR, no acute ST-T changes  Recent Labs: 05/15/2016: BUN 11; Creatinine, Ser 0.74; Hemoglobin 12.8; Platelets 329; Potassium 3.2; Sodium 138; TSH 2.466   Lipid Panel    Component Value Date/Time   CHOL 186 06/16/2014 1109   TRIG 154 (H) 06/16/2014 1109   HDL 49 06/16/2014 1109   CHOLHDL 3.8 06/16/2014 1109   VLDL 31 06/16/2014 1109   LDLCALC 106 (H) 06/16/2014 1109    Additional studies/ records that were reviewed today include: Summarized above    ASSESSMENT & PLAN:   1. Chest pain - with mixed typical/atypical features. She certainly  does have substrate for chest pain with known myocardial bridging. I reviewed with Dr. Domenic Polite. At this time we feel she would benefit from trial of medical therapy titration. She has been taking metoprolol 25mg  sporadically since this started. She took it twice a day yesterday and has been feeling better the last 2 days. Will have her continue to take this scheduled BID for now and follow symptoms. Continue verapamil. She also has been taking rare baby aspirin. Will have her take 1 daily during this time of surveillance. Could consider functional study if symptoms recur. Warning sx reviewed. Would avoid nitrates given #2. 2. Myocardial bridging - see above. Will have her take metoprolol regularly scheduled. 3. PSVT - quiescent on present regimen. 4. Aortic regurgitation - per review of records and exam  today, no significant progression of murmur on exam. Will defer to MD timing of f/u echocardiogram - consider obtaining within the next 6 months.  Disposition: F/u with Dr. Domenic Polite.   Medication Adjustments/Labs and Tests Ordered: Current medicines are reviewed at length with the patient today.  Concerns regarding medicines are outlined above. Medication changes, Labs and Tests ordered today are summarized above and listed in the Patient Instructions accessible in Encounters.   Signed, Charlie Pitter, PA-C  01/29/2017 3:58 PM    [facility] Location in [place], [place] [address] Ph: [phone]; Fax [phone]

## 2017-01-29 NOTE — Patient Instructions (Signed)
Your physician recommends that you schedule a follow-up appointment in: 6-8 Weeks with Dr. Domenic Polite.   Your physician has recommended you make the following change in your medication: Start Aspirin 81 mg Daily   Take Lopressor 25 mg Two times daily   If you need a refill on your cardiac medications before your next appointment, please call your pharmacy.  Thank you for choosing [facility]!

## 2017-02-02 ENCOUNTER — Other Ambulatory Visit: Payer: Self-pay

## 2017-02-02 ENCOUNTER — Telehealth: Payer: Self-pay

## 2017-02-02 DIAGNOSIS — Z79899 Other long term (current) drug therapy: Secondary | ICD-10-CM

## 2017-02-02 MED ORDER — POTASSIUM CHLORIDE CRYS ER 20 MEQ PO TBCR: 20.0000 meq | EXTENDED_RELEASE_TABLET | Freq: Every day | ORAL | 3 refills | Status: DC

## 2017-02-02 NOTE — Telephone Encounter (Signed)
Spoke with Melina Copa, PA  This morning and she told me the pt's lab results. Her potassium was a little low. She wanted her to start taking 20 meq daily of potassium and repeat labs in 1 week. Sent RX to pharmacy, ordered  labs. Called and left detailed message on pt's private voicemail.

## 2017-02-17 ENCOUNTER — Other Ambulatory Visit (HOSPITAL_COMMUNITY)
Admission: RE | Admit: 2017-02-17 | Discharge: 2017-02-17 | Disposition: A | Discharge status: Home / Self Care | Payer: BC Managed Care – PPO | Source: Ambulatory Visit | Attending: Physician Assistant | Admitting: Physician Assistant

## 2017-02-17 ENCOUNTER — Telehealth: Payer: Self-pay

## 2017-02-17 ENCOUNTER — Other Ambulatory Visit: Payer: Self-pay

## 2017-02-17 DIAGNOSIS — Z79899 Other long term (current) drug therapy: Secondary | ICD-10-CM | POA: Diagnosis present

## 2017-02-17 LAB — BASIC METABOLIC PANEL
Anion gap: 8 (ref 5–15)
BUN: 12 mg/dL (ref 6–20)
CO2: 27 mmol/L (ref 22–32)
Calcium: 8.9 mg/dL (ref 8.9–10.3)
Chloride: 104 mmol/L (ref 101–111)
Creatinine, Ser: 0.85 mg/dL (ref 0.44–1.00)
GFR calc Af Amer: >60 mL/min (ref >60)
GFR calc non Af Amer: >60 mL/min (ref >60)
Glucose, Bld: 84 mg/dL (ref 65–99)
Potassium: 3.5 mmol/L (ref 3.5–5.1)
Sodium: 139 mmol/L (ref 135–145)

## 2017-02-17 MED ORDER — POTASSIUM CHLORIDE CRYS ER 20 MEQ PO TBCR: 40.0000 meq | EXTENDED_RELEASE_TABLET | Freq: Every day | ORAL | 3 refills | Status: DC

## 2017-02-17 NOTE — Telephone Encounter (Signed)
Called pt, left message for pt to return call.  

## 2017-02-17 NOTE — Telephone Encounter (Signed)
-----   Message from Charlie Pitter, [place] sent at 02/17/2017  2:24 PM EDT ----- Please call patient. Potassium has improved but still below what we like to see. Increase KCl to 78meq daily (can also take 61meq BID if that's easier given size of pills - either way is fine with me). Please increase dietary intake of healthy sources of potassium including bananas, squash, yogurt, white beans, sweet potatoes, leafy greens, and avocados.  Dayna Dunn PA-C

## 2017-02-24 ENCOUNTER — Encounter: Payer: Self-pay | Admitting: Cardiology

## 2017-03-15 ENCOUNTER — Ambulatory Visit: Payer: BC Managed Care – PPO | Admitting: Cardiology

## 2017-03-16 ENCOUNTER — Ambulatory Visit (INDEPENDENT_AMBULATORY_CARE_PROVIDER_SITE_OTHER): Payer: BC Managed Care – PPO | Admitting: Cardiology

## 2017-03-16 ENCOUNTER — Encounter: Payer: Self-pay | Admitting: Cardiology

## 2017-03-16 VITALS — BP 116/72 | HR 64 | Ht 63.0 in | Wt 171.0 lb

## 2017-03-16 DIAGNOSIS — I471 Supraventricular tachycardia: Secondary | ICD-10-CM | POA: Diagnosis not present

## 2017-03-16 DIAGNOSIS — Q245 Malformation of coronary vessels: Secondary | ICD-10-CM | POA: Diagnosis not present

## 2017-03-16 DIAGNOSIS — I351 Nonrheumatic aortic (valve) insufficiency: Secondary | ICD-10-CM | POA: Diagnosis not present

## 2017-03-16 NOTE — Patient Instructions (Signed)
Your physician wants you to follow-up in: 6 months with Dr Ferne Reus will receive a reminder letter in the mail two months in advance. If you don't receive a letter, please call our office to schedule the follow-up appointment.    Your physician recommends that you continue on your current medications as directed. Please refer to the Current Medication list given to you today.   No labs or tests ordered today.    Thank you for choosing [facility] !

## 2017-03-16 NOTE — Progress Notes (Signed)
Cardiology Office Note  Date: 03/16/2017   ID: Debbie Allen, DOB October 24, 1965, MRN [id number]  PCP: Sinda Du, MD  Primary Cardiologist: Rozann Lesches, MD   Chief Complaint  Patient presents with  . PSVT    History of Present Illness: Debbie Allen is a 51 y.o. female last seen by Ms. Dunn PA-C in May. She was evaluated for recurrent chest pain at that time. Beta blocker dose was increased and she has done very well. No reported palpitations or recurring chest pain.  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  I reviewed her current medications which are outlined below. Lopressor is a 25 mg twice daily, she also continues on Pravachol, verapamil and aspirin.  Past Medical History:  Diagnosis Date  . Acid reflux   . Aortic regurgitation    Asymptomatic  . Asthma   . Brain tumor (benign) ([place])   . 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 PSVT (paroxysmal supraventricular tachycardia) (HCC)    Palpitations - suspected diagnosis (LGL postulated by Dr. Debara Pickett but never documented)    History reviewed. No pertinent surgical history.  Current Outpatient Prescriptions  Medication Sig Dispense Refill  . albuterol (PROVENTIL HFA;VENTOLIN HFA) 108 (90 BASE) MCG/ACT inhaler Inhale 2 puffs into the lungs every 6 (six) hours as needed for wheezing or shortness of breath.    Marland Kitchen aspirin EC 81 MG tablet Take 1 tablet (81 mg total) by mouth daily. 90 tablet 3  . budesonide-formoterol (SYMBICORT) 160-4.5 MCG/ACT inhaler Inhale 2 puffs into the lungs 2 (two) times daily as needed.    Marland Kitchen levocetirizine (XYZAL) 5 MG tablet Take 5 mg by mouth daily.    . metoprolol tartrate (LOPRESSOR) 25 MG tablet Take 1 tablet (25 mg total) by mouth 2 (two) times daily. 270 tablet 3  . montelukast (SINGULAIR) 10 MG tablet Take 1 tablet by mouth daily.    . Olopatadine HCl 0.6 %  SOLN Place into the nose as needed.    . pantoprazole (PROTONIX) 40 MG tablet Take 1 tablet (40 mg total) by mouth daily. 30 tablet 6  . potassium chloride SA (K-DUR,KLOR-CON) 20 MEQ tablet Take 2 tablets (40 mEq total) by mouth daily. 90 tablet 3  . pravastatin (PRAVACHOL) 40 MG tablet TAKE ONE TABLET BY MOUTH ONCE DAILY IN THE EVENING 90 tablet 3  . verapamil (VERELAN PM) 240 MG 24 hr capsule TAKE ONE CAPSULE BY MOUTH ONCE DAILY 90 capsule 3   No current facility-administered medications for this visit.    Allergies:  Codeine   Social History: The patient  reports that she has never smoked. She has never used smokeless tobacco. She reports that she drinks alcohol. She reports that she does not use drugs.   ROS:  Please see the history of present illness. Otherwise, complete review of systems is positive for none.  All other systems are reviewed and negative.   Physical Exam: VS:  BP 116/72   Pulse 64   Ht 5\' 3"  (1.6 m)   Wt 171 lb (77.6 kg)   SpO2 98%   BMI 30.29 kg/m , BMI Body mass index is 30.29 kg/m.  Wt Readings from Last 3 Encounters:  03/16/17 171 lb (77.6 kg)  01/29/17 170 lb (77.1 kg)  06/22/16 166 lb (75.3 kg)    Patient appears comfortable at rest.  HEENT: Conjunctiva and lids  normal, oropharynx clear.  Neck: Supple, no elevated JVP or carotid bruits, no thyromegaly.  Lungs: Clear to auscultation, nonlabored breathing at rest.  Cardiac: Regular rate and rhythm, no S3 with 1/6 systolic murmur, 2/6 diastolic murmur at the left base, no pericardial rub.  Abdomen: Soft, nontender, bowel sounds present.  Extremities: No pitting edema, distal pulses 2+.   ECG: I personally reviewed the tracing from 01/29/2017 which showed sinus rhythm with PR interval 116 ms.  Recent Labwork: 01/29/2017: Hemoglobin 12.8; Platelets 330; TSH 2.945 02/17/2017: BUN 12; Creatinine, Ser 0.85; Potassium 3.5; Sodium 139     Component Value Date/Time   CHOL 186 06/16/2014 1109   TRIG 154  (H) 06/16/2014 1109   HDL 49 06/16/2014 1109   CHOLHDL 3.8 06/16/2014 1109   VLDL 31 06/16/2014 1109   LDLCALC 106 (H) 06/16/2014 1109    Other Studies Reviewed Today:  Lexiscan Cardiolite 06/21/2014: FINDINGS: Stress/ECG data: The patient was stressed according to the Lexiscan protocol. The heart rate ranged from 69 to 115 beats per min. The blood pressure averaged 131/88. The patient experienced mild chest tightness with Lexiscan infusion.  Resting ECG demonstrated normal sinus rhythm. With stress, there were no ischemic ST segment or T-wave abnormalities, nor any arrhythmias.  Perfusion: No decreased activity in the left ventricle on stress imaging to suggest reversible ischemia or infarction.  Wall Motion: Normal left ventricular wall motion. No left ventricular dilation.  Left Ventricular Ejection Fraction: 51 %  End diastolic volume 68 ml  End systolic volume 33 ml  IMPRESSION: 1. No reversible ischemia or infarction.  2. Normal left ventricular wall motion.  3. Left ventricular ejection fraction 51%  4. Low-risk stress test findings*.  Echocardiogram 07/05/2015: Study Conclusions  - Left ventricle: The cavity size was normal. Wall thickness was normal. Systolic function was normal. The estimated ejection fraction was in the range of 60% to 65%. Wall motion was normal; there were no regional wall motion abnormalities. Left ventricular diastolic function parameters were normal. - Aortic valve: There was mild to moderate regurgitation. Regurgitation pressure half-time: 848 ms. - Mitral valve: There was mild regurgitation. - Tricuspid valve: There was mild regurgitation.  Assessment and Plan:  1. PSVT, quiescent on current dose of Lopressor and verapamil.  2. Mild to moderate aortic regurgitation by echocardiogram in 2016. Stable on examination. She is asymptomatic.  3. History of chest pain, intramyocardial bridging of the septal  perforator as well as anomalous origin of the RCA. Myoview study was low risk in 2015.  Current medicines were reviewed with the patient today.  Disposition: Follow-up in 6 months.  Signed, Satira Sark, MD, [facility] 03/16/2017 3:49 PM    [place] at [facility] [address], [place], [place] [address] Phone: [phone]; Fax: [phone]

## 2017-06-11 ENCOUNTER — Other Ambulatory Visit: Payer: Self-pay | Admitting: Cardiology

## 2017-10-28 ENCOUNTER — Other Ambulatory Visit: Payer: Self-pay | Admitting: Physician Assistant

## 2017-10-29 ENCOUNTER — Other Ambulatory Visit: Payer: Self-pay

## 2017-10-29 MED ORDER — POTASSIUM CHLORIDE CRYS ER 20 MEQ PO TBCR: 40.0000 meq | EXTENDED_RELEASE_TABLET | Freq: Every day | ORAL | 3 refills | Status: DC

## 2017-10-29 NOTE — Telephone Encounter (Signed)
Refilled potassium to [facility]

## 2017-10-31 NOTE — Progress Notes (Signed)
Cardiology Office Note  Date: 11/01/2017   ID: Debbie Allen, DOB 1966-06-19, MRN [id number]  PCP: Sinda Du, MD  Primary Cardiologist: Rozann Lesches, MD   Chief Complaint  Patient presents with  . PSVT    History of Present Illness: Debbie Allen is a 53 y.o. female last seen in July 2018.  She presents for a routine follow-up visit.  Reports only rare occasional palpitations, had more breakthrough events during an episode of illness back in December 2018.  She has had no changes in her cardiac medications including verapamil.  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  She does not report any exertional chest pain.  She has been exercising 3 days a week.  Has not been able to lose any weight as yet.  Still feels better however.  Past Medical History:  Diagnosis Date  . Acid reflux   . Aortic regurgitation    Asymptomatic  . Asthma   . Brain tumor (benign) ([place])   . 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 PSVT (paroxysmal supraventricular tachycardia) (HCC)    Palpitations - suspected diagnosis (LGL postulated by Dr. Debara Pickett but never documented)    Past Surgical History:  Procedure Laterality Date  . DILATION AND CURETTAGE OF UTERUS    . LEFT HEART CATH      Current Outpatient Medications  Medication Sig Dispense Refill  . albuterol (PROVENTIL HFA;VENTOLIN HFA) 108 (90 BASE) MCG/ACT inhaler Inhale 2 puffs into the lungs every 6 (six) hours as needed for wheezing or shortness of breath.    Marland Kitchen aspirin EC 81 MG tablet Take 1 tablet (81 mg total) by mouth daily. 90 tablet 3  . budesonide-formoterol (SYMBICORT) 160-4.5 MCG/ACT inhaler Inhale 2 puffs into the lungs 2 (two) times daily as needed.    Marland Kitchen levocetirizine (XYZAL) 5 MG tablet Take 5 mg by mouth daily.    . montelukast (SINGULAIR) 10 MG tablet Take 1 tablet  by mouth daily.    . Olopatadine HCl 0.6 % SOLN Place into the nose as needed.    . pantoprazole (PROTONIX) 40 MG tablet Take 1 tablet (40 mg total) by mouth daily. 30 tablet 6  . potassium chloride SA (K-DUR,KLOR-CON) 20 MEQ tablet Take 2 tablets (40 mEq total) by mouth daily. 180 tablet 3  . pravastatin (PRAVACHOL) 40 MG tablet TAKE ONE TABLET BY MOUTH ONCE DAILY IN THE EVENING 90 tablet 3  . verapamil (VERELAN PM) 240 MG 24 hr capsule TAKE ONE CAPSULE BY MOUTH ONCE DAILY. 90 capsule 3   No current facility-administered medications for this visit.    Allergies:  Codeine   Social History: The patient  reports that  has never smoked. she has never used smokeless tobacco. She reports that she drinks alcohol. She reports that she does not use drugs.   ROS:  Please see the history of present illness. Otherwise, complete review of systems is positive for none.  All other systems are reviewed and negative.   Physical Exam: VS:  BP 126/76   Pulse 66   Ht 5\' 3"  (1.6 m)   Wt 178 lb (80.7 kg)   SpO2 96%   BMI 31.53 kg/m , BMI Body mass index is 31.53 kg/m.  Wt Readings from Last 3 Encounters:  11/01/17 178 lb (80.7 kg)  03/16/17 171 lb (77.6 kg)  01/29/17 170 lb (77.1 kg)    General: Patient appears comfortable at rest. HEENT: Conjunctiva and lids normal, oropharynx clear. Neck: Supple, no elevated JVP or carotid bruits, no thyromegaly. Lungs: Clear to auscultation, nonlabored breathing at rest. Cardiac: Regular rate and rhythm, no S3 or significant systolic murmur, no pericardial rub. Abdomen: Soft, nontender, bowel sounds present. Extremities: No pitting edema, distal pulses 2+. Skin: Warm and dry. Musculoskeletal: No kyphosis. Neuropsychiatric: Alert and oriented x3, affect grossly appropriate.  ECG: I personally reviewed the tracing from 01/29/2017 which showed sinus rhythm with PR interval 116 ms.  Recent Labwork: 01/29/2017: Hemoglobin 12.8; Platelets 330; TSH 2.945 02/17/2017:  BUN 12; Creatinine, Ser 0.85; Potassium 3.5; Sodium 139   Other Studies Reviewed Today:  Lexiscan Cardiolite 06/21/2014: FINDINGS: Stress/ECG data: The patient was stressed according to the Lexiscan protocol. The heart rate ranged from 69 to 115 beats per min. The blood pressure averaged 131/88. The patient experienced mild chest tightness with Lexiscan infusion.  Resting ECG demonstrated normal sinus rhythm. With stress, there were no ischemic ST segment or T-wave abnormalities, nor any arrhythmias.  Perfusion: No decreased activity in the left ventricle on stress imaging to suggest reversible ischemia or infarction.  Wall Motion: Normal left ventricular wall motion. No left ventricular dilation.  Left Ventricular Ejection Fraction: 51 %  End diastolic volume 68 ml  End systolic volume 33 ml  IMPRESSION: 1. No reversible ischemia or infarction.  2. Normal left ventricular wall motion.  3. Left ventricular ejection fraction 51%  4. Low-risk stress test findings*.  Echocardiogram 07/05/2015: Study Conclusions  - Left ventricle: The cavity size was normal. Wall thickness was normal. Systolic function was normal. The estimated ejection fraction was in the range of 60% to 65%. Wall motion was normal; there were no regional wall motion abnormalities. Left ventricular diastolic function parameters were normal. - Aortic valve: There was mild to moderate regurgitation. Regurgitation pressure half-time: 848 ms. - Mitral valve: There was mild regurgitation. - Tricuspid valve: There was mild regurgitation.  Assessment and Plan:  1.  History of PSVT, no progressive symptoms on current regimen.  Continue observation.  2.  Mild to moderate aortic regurgitation.  No change in murmur on examination.  3.  History of intramyocardial bridging of the septal perforator as well as anomalous origin of the RCA.  No active angina symptoms with regular  exercise.  Current medicines were reviewed with the patient today.  Disposition: Follow-up in 6 months.  Signed, Satira Sark, MD, [facility] 11/01/2017 4:39 PM    [place] at [facility] [address], [place], [place] [address] Phone: [phone]; Fax: [phone]

## 2017-11-01 ENCOUNTER — Ambulatory Visit: Payer: BC Managed Care – PPO | Admitting: Cardiology

## 2017-11-01 ENCOUNTER — Encounter: Payer: Self-pay | Admitting: Cardiology

## 2017-11-01 VITALS — BP 126/76 | HR 66 | Ht 63.0 in | Wt 178.0 lb

## 2017-11-01 DIAGNOSIS — I471 Supraventricular tachycardia: Secondary | ICD-10-CM

## 2017-11-01 DIAGNOSIS — Q245 Malformation of coronary vessels: Secondary | ICD-10-CM | POA: Diagnosis not present

## 2017-11-01 DIAGNOSIS — I351 Nonrheumatic aortic (valve) insufficiency: Secondary | ICD-10-CM

## 2017-11-01 NOTE — Patient Instructions (Signed)
Your physician wants you to follow-up in:6 months with Dr.McDowell You will receive a reminder letter in the mail two months in advance. If you don't receive a letter, please call our office to schedule the follow-up appointment.   Your physician recommends that you continue on your current medications as directed. Please refer to the Current Medication list given to you today.   If you need a refill on your cardiac medications before your next appointment, please call your pharmacy.    No lab work or tests ordered today.      Thank you for choosing [facility] !         

## 2017-11-08 ENCOUNTER — Ambulatory Visit (INDEPENDENT_AMBULATORY_CARE_PROVIDER_SITE_OTHER): Payer: BC Managed Care – PPO | Admitting: Otolaryngology

## 2017-11-08 DIAGNOSIS — H903 Sensorineural hearing loss, bilateral: Secondary | ICD-10-CM

## 2017-11-08 DIAGNOSIS — H6983 Other specified disorders of Eustachian tube, bilateral: Secondary | ICD-10-CM

## 2017-11-29 ENCOUNTER — Other Ambulatory Visit: Payer: Self-pay | Admitting: Cardiology

## 2018-04-29 ENCOUNTER — Other Ambulatory Visit: Payer: Self-pay | Admitting: Physician Assistant

## 2018-06-14 ENCOUNTER — Other Ambulatory Visit: Payer: Self-pay | Admitting: Cardiology

## 2018-08-01 ENCOUNTER — Other Ambulatory Visit: Payer: Self-pay | Admitting: Physician Assistant

## 2018-09-12 ENCOUNTER — Other Ambulatory Visit: Payer: Self-pay | Admitting: Cardiology

## 2018-10-12 ENCOUNTER — Other Ambulatory Visit: Payer: Self-pay | Admitting: Cardiology

## 2018-10-21 ENCOUNTER — Other Ambulatory Visit: Payer: Self-pay | Admitting: Cardiology

## 2018-11-07 ENCOUNTER — Ambulatory Visit (INDEPENDENT_AMBULATORY_CARE_PROVIDER_SITE_OTHER): Payer: BC Managed Care – PPO | Admitting: Otolaryngology

## 2018-11-07 DIAGNOSIS — H903 Sensorineural hearing loss, bilateral: Secondary | ICD-10-CM | POA: Diagnosis not present

## 2018-11-16 NOTE — Progress Notes (Signed)
Cardiology Office Note  Date: 11/17/2018   ID: Debbie Allen, DOB [birth year], MRN [id number]  PCP: Sinda Du, MD  Primary Cardiologist: Rozann Lesches, MD   Chief Complaint  Patient presents with  . PSVT    History of Present Illness: Debbie Allen is a 53 y.o. female last seen in February 2019.  She is here for a routine follow-up visit.  She reports only rare palpitations, no recurring chest pain with activity.  She has been having some trouble with leg cramps.  She exercises at [employer] using a stationary bicycle and treadmill.  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  I reviewed her medications which are outlined below.  She reports compliance.  We discussed obtaining follow-up lab work.  I personally reviewed her ECG today which shows a sinus bradycardia with poor R wave progression.  Past Medical History:  Diagnosis Date  . Acid reflux   . Aortic regurgitation    Asymptomatic  . Asthma   . Brain tumor (benign) ([place])   . 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 PSVT (paroxysmal supraventricular tachycardia) (HCC)    Palpitations - suspected diagnosis (LGL postulated by Dr. Debara Pickett but never documented)    Past Surgical History:  Procedure Laterality Date  . DILATION AND CURETTAGE OF UTERUS    . LEFT HEART CATH      Current Outpatient Medications  Medication Sig Dispense Refill  . albuterol (PROVENTIL HFA;VENTOLIN HFA) 108 (90 BASE) MCG/ACT inhaler Inhale 2 puffs into the lungs every 6 (six) hours as needed for wheezing or shortness of breath.    Marland Kitchen aspirin EC 81 MG tablet Take 1 tablet (81 mg total) by mouth daily. 90 tablet 3  . budesonide-formoterol (SYMBICORT) 160-4.5 MCG/ACT inhaler Inhale 2 puffs into the lungs 2 (two) times daily as needed.    Marland Kitchen esomeprazole (NEXIUM) 20 MG capsule Take 20 mg by mouth daily at 12 noon.    Marland Kitchen levocetirizine (XYZAL) 5  MG tablet Take 5 mg by mouth daily.    . metoprolol tartrate (LOPRESSOR) 25 MG tablet TAKE ONE TABLET BY MOUTH 2 TIMES A DAY. 180 tablet 3  . montelukast (SINGULAIR) 10 MG tablet Take 1 tablet by mouth daily.    . Olopatadine HCl 0.6 % SOLN Place into the nose as needed.    . potassium chloride SA (K-DUR,KLOR-CON) 20 MEQ tablet Take 2 tablets (40 mEq total) by mouth daily. 180 tablet 3  . pravastatin (PRAVACHOL) 40 MG tablet TAKE ONE TABLET BY MOUTH ONCE DAILY IN THE EVENING. 30 tablet 6  . verapamil (VERELAN PM) 240 MG 24 hr capsule TAKE ONE CAPSULE BY MOUTH ONCE DAILY. 30 capsule 6   No current facility-administered medications for this visit.    Allergies:  Codeine   Social History: The patient  reports that she has never smoked. She has never used smokeless tobacco. She reports current alcohol use. She reports that she does not use drugs.   ROS:  Please see the history of present illness. Otherwise, complete review of systems is positive for none.  All other systems are reviewed and negative.   Physical Exam: VS:  BP 126/74   Pulse 62   Ht 5' 3"  (1.6 m)   Wt 177 lb 12.8 oz (80.6 kg)   SpO2 98%   BMI 31.50 kg/m , BMI Body mass index is 31.5 kg/m.  Wt Readings from Last 3 Encounters:  11/17/18 177 lb 12.8 oz (80.6 kg)  11/01/17 178 lb (80.7 kg)  03/16/17 171 lb (77.6 kg)    General: Patient appears comfortable at rest. HEENT: Conjunctiva and lids normal, oropharynx clear. Neck: Supple, no elevated JVP or carotid bruits, no thyromegaly. Lungs: Clear to auscultation, nonlabored breathing at rest. Cardiac: Regular rate and rhythm, no S3 or significant systolic murmur. Abdomen: Soft, nontender, bowel sounds present. Extremities: No pitting edema, distal pulses 2+. Skin: Warm and dry. Musculoskeletal: No kyphosis. Neuropsychiatric: Alert and oriented x3, affect grossly appropriate.  ECG: I personally reviewed the tracing from 01/29/2017 which showed sinus rhythm with PR interval  116 ms.  Recent Labwork:  June 2018: Potassium 3.5, BUN 12, creatinine 0.85  Other Studies Reviewed Today:  Lexiscan Cardiolite 06/21/2014: FINDINGS: Stress/ECG data: The patient was stressed according to the Lexiscan protocol. The heart rate ranged from 69 to 115 beats per min. The blood pressure averaged 131/88. The patient experienced mild chest tightness with Lexiscan infusion.  Resting ECG demonstrated normal sinus rhythm. With stress, there were no ischemic ST segment or T-wave abnormalities, nor any arrhythmias.  Perfusion: No decreased activity in the left ventricle on stress imaging to suggest reversible ischemia or infarction.  Wall Motion: Normal left ventricular wall motion. No left ventricular dilation.  Left Ventricular Ejection Fraction: 51 %  End diastolic volume 68 ml  End systolic volume 33 ml  IMPRESSION: 1. No reversible ischemia or infarction.  2. Normal left ventricular wall motion.  3. Left ventricular ejection fraction 51%  4. Low-risk stress test findings*.  Echocardiogram 07/05/2015: Study Conclusions  - Left ventricle: The cavity size was normal. Wall thickness was normal. Systolic function was normal. The estimated ejection fraction was in the range of 60% to 65%. Wall motion was normal; there were no regional wall motion abnormalities. Left ventricular diastolic function parameters were normal. - Aortic valve: There was mild to moderate regurgitation. Regurgitation pressure half-time: 848 ms. - Mitral valve: There was mild regurgitation. - Tricuspid valve: There was mild regurgitation.  Assessment and Plan:  1.  History of PSVT.  No progressive symptoms on medical therapy which includes verapamil and Lopressor.  ECG reviewed.  Continue observation.  2.  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  She is asymptomatic in terms of exertional chest pain.  Continue  regular activity and observation.  3.  Leg cramps.  Discussed hydration.  She is also on potassium supplement.  Plan to obtain c-Met and magnesium.  4.  Mixed hyperlipidemia, on Pravachol.  Follow-up FLP.  Current medicines were reviewed with the patient today.   Orders Placed This Encounter  Procedures  . Comprehensive metabolic panel  . Magnesium  . Lipid Profile  . EKG 12-Lead    Disposition: Follow-up in 1 year.  Signed, Satira Sark, MD, [facility] 11/17/2018 12:13 PM    [place] at [facility] [address] [place], [place] [address] Phone: [phone]; Fax: [phone]

## 2018-11-17 ENCOUNTER — Encounter: Payer: Self-pay | Admitting: Cardiology

## 2018-11-17 ENCOUNTER — Ambulatory Visit (INDEPENDENT_AMBULATORY_CARE_PROVIDER_SITE_OTHER): Payer: BC Managed Care – PPO | Admitting: Cardiology

## 2018-11-17 ENCOUNTER — Other Ambulatory Visit: Payer: Self-pay

## 2018-11-17 VITALS — BP 126/74 | HR 62 | Ht 63.0 in | Wt 177.8 lb

## 2018-11-17 DIAGNOSIS — I471 Supraventricular tachycardia: Secondary | ICD-10-CM | POA: Diagnosis not present

## 2018-11-17 DIAGNOSIS — E785 Hyperlipidemia, unspecified: Secondary | ICD-10-CM

## 2018-11-17 DIAGNOSIS — R252 Cramp and spasm: Secondary | ICD-10-CM | POA: Diagnosis not present

## 2018-11-17 DIAGNOSIS — Q245 Malformation of coronary vessels: Secondary | ICD-10-CM | POA: Diagnosis not present

## 2018-11-17 NOTE — Patient Instructions (Signed)
Medication Instructions:  Your physician recommends that you continue on your current medications as directed. Please refer to the Current Medication list given to you today.  If you need a refill on your cardiac medications before your next appointment, please call your pharmacy.   Lab work: CBC,CMET,Lipids If you have labs (blood work) drawn today and your tests are completely normal, you will receive your results only by: Marland Kitchen MyChart Message (if you have MyChart) OR . A paper copy in the mail If you have any lab test that is abnormal or we need to change your treatment, we will call you to review the results.  Testing/Procedures: None today  Follow-Up: At [facility], you and your health needs are our priority.  As part of our continuing mission to provide you with exceptional heart care, we have created designated Provider Care Teams.  These Care Teams include your primary Cardiologist (physician) and Advanced Practice Providers (APPs -  Physician Assistants and Nurse Practitioners) who all work together to provide you with the care you need, when you need it. You will need a follow up appointment in 1 years.  Please call our office 2 months in advance to schedule this appointment.  You may see No primary care provider on file. or one of the following Advanced Practice Providers on your designated Care Team:   [place], PA-C [facility]) . Ermalinda Barrios, PA-C ([place])  Any Other Special Instructions Will Be Listed Below (If Applicable). None

## 2018-11-18 ENCOUNTER — Other Ambulatory Visit: Payer: Self-pay | Admitting: Cardiology

## 2019-06-21 ENCOUNTER — Other Ambulatory Visit: Payer: Self-pay

## 2019-06-21 DIAGNOSIS — Z20822 Contact with and (suspected) exposure to covid-19: Secondary | ICD-10-CM

## 2019-06-23 LAB — NOVEL CORONAVIRUS, NAA: SARS-CoV-2, NAA: NOT DETECTED

## 2019-07-09 HISTORY — PX: COLONOSCOPY: SHX174

## 2019-07-31 ENCOUNTER — Other Ambulatory Visit: Payer: Self-pay | Admitting: Physician Assistant

## 2019-08-21 ENCOUNTER — Other Ambulatory Visit: Payer: Self-pay | Admitting: Cardiology

## 2019-08-29 ENCOUNTER — Other Ambulatory Visit: Payer: Self-pay | Admitting: Cardiology

## 2019-09-12 ENCOUNTER — Ambulatory Visit: Payer: BC Managed Care – PPO | Attending: Internal Medicine

## 2019-09-12 ENCOUNTER — Other Ambulatory Visit: Payer: Self-pay

## 2019-09-12 DIAGNOSIS — Z20822 Contact with and (suspected) exposure to covid-19: Secondary | ICD-10-CM

## 2019-09-14 LAB — NOVEL CORONAVIRUS, NAA: SARS-CoV-2, NAA: NOT DETECTED

## 2019-09-28 ENCOUNTER — Ambulatory Visit: Payer: BC Managed Care – PPO | Attending: Internal Medicine

## 2019-09-28 ENCOUNTER — Other Ambulatory Visit: Payer: Self-pay

## 2019-09-28 DIAGNOSIS — Z20822 Contact with and (suspected) exposure to covid-19: Secondary | ICD-10-CM

## 2019-09-29 LAB — NOVEL CORONAVIRUS, NAA: SARS-CoV-2, NAA: NOT DETECTED

## 2019-11-20 ENCOUNTER — Other Ambulatory Visit: Payer: Self-pay | Admitting: Cardiology

## 2019-12-06 ENCOUNTER — Ambulatory Visit: Payer: BC Managed Care – PPO | Admitting: Cardiology

## 2019-12-06 ENCOUNTER — Other Ambulatory Visit: Payer: Self-pay

## 2019-12-06 ENCOUNTER — Encounter: Payer: Self-pay | Admitting: Cardiology

## 2019-12-06 VITALS — BP 126/74 | HR 72 | Temp 97.4°F | Ht 63.0 in | Wt 182.0 lb

## 2019-12-06 DIAGNOSIS — E782 Mixed hyperlipidemia: Secondary | ICD-10-CM | POA: Diagnosis not present

## 2019-12-06 DIAGNOSIS — Q245 Malformation of coronary vessels: Secondary | ICD-10-CM | POA: Diagnosis not present

## 2019-12-06 DIAGNOSIS — I471 Supraventricular tachycardia: Secondary | ICD-10-CM | POA: Diagnosis not present

## 2019-12-06 NOTE — Patient Instructions (Signed)
Medication Instructions:  Your physician recommends that you continue on your current medications as directed. Please refer to the Current Medication list given to you today.  *If you need a refill on your cardiac medications before your next appointment, please call your pharmacy*   Lab Work: Fasting lipid and LFT's  If you have labs (blood work) drawn today and your tests are completely normal, you will receive your results only by: Marland Kitchen MyChart Message (if you have MyChart) OR . A paper copy in the mail If you have any lab test that is abnormal or we need to change your treatment, we will call you to review the results.   Testing/Procedures: None today   Follow-Up: At [facility], you and your health needs are our priority.  As part of our continuing mission to provide you with exceptional heart care, we have created designated Provider Care Teams.  These Care Teams include your primary Cardiologist (physician) and Advanced Practice Providers (APPs -  Physician Assistants and Nurse Practitioners) who all work together to provide you with the care you need, when you need it.  We recommend signing up for the patient portal called "MyChart".  Sign up information is provided on this After Visit Summary.  MyChart is used to connect with patients for Virtual Visits (Telemedicine).  Patients are able to view lab/test results, encounter notes, upcoming appointments, etc.  Non-urgent messages can be sent to your provider as well.   To learn more about what you can do with MyChart, go to [email or web address].    Your next appointment:   12 month(s)  The format for your next appointment:   In Person  Provider:   Rozann Lesches, MD   Other Instructions None       Thank you for choosing [facility] !

## 2019-12-06 NOTE — Progress Notes (Signed)
Cardiology Office Note  Date: 12/06/2019   ID: AVALEEN TILLMAN, DOB 12-07-1965, MRN [id number]  PCP:  Patient, No Pcp Per  Cardiologist:  Rozann Lesches, MD Electrophysiologist:  None   Chief Complaint  Patient presents with  . Cardiac follow-up    History of Present Illness: Debbie Allen is a 54 y.o. female last seen in March 2020.  She presents for a routine visit.  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  She does not report any palpitations or chest pain.  I reviewed her medications which are stable from a cardiac perspective and outlined below.  She is due for a follow-up lipid panel.  At this point she is in transition following the retirement of Dr. Luan Pulling.  I personally reviewed her ECG today which shows normal sinus rhythm with poor R wave progression.  Past Medical History:  Diagnosis Date  . Acid reflux   . Aortic regurgitation    Asymptomatic  . Asthma   . Brain tumor (benign) ([place])   . 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 PSVT (paroxysmal supraventricular tachycardia) (HCC)    Palpitations - suspected diagnosis (LGL postulated by Dr. Debara Pickett but never documented)    Past Surgical History:  Procedure Laterality Date  . COLONOSCOPY  07/2019  . DILATION AND CURETTAGE OF UTERUS    . LEFT HEART CATH      Current Outpatient Medications  Medication Sig Dispense Refill  . albuterol (PROVENTIL HFA;VENTOLIN HFA) 108 (90 BASE) MCG/ACT inhaler Inhale 2 puffs into the lungs every 6 (six) hours as needed for wheezing or shortness of breath.    Marland Kitchen aspirin EC 81 MG tablet Take 1 tablet (81 mg total) by mouth daily. 90 tablet 3  . budesonide-formoterol (SYMBICORT) 160-4.5 MCG/ACT inhaler Inhale 2 puffs into the lungs 2 (two) times daily as needed.    Marland Kitchen esomeprazole (NEXIUM) 20 MG capsule Take 20 mg  by mouth daily at 12 noon.    Marland Kitchen levocetirizine (XYZAL) 5 MG tablet Take 5 mg by mouth daily.    . metoprolol tartrate (LOPRESSOR) 25 MG tablet TAKE ONE TABLET BY MOUTH 2 TIMES A DAY. 180 tablet 3  . montelukast (SINGULAIR) 10 MG tablet Take 1 tablet by mouth daily.    . Olopatadine HCl 0.6 % SOLN Place into the nose as needed.    . potassium chloride SA (KLOR-CON) 20 MEQ tablet TAKE 2 TABLETS BY MOUTH DAILY. 180 tablet 0  . pravastatin (PRAVACHOL) 40 MG tablet TAKE ONE TABLET BY MOUTH ONCE DAILY IN THE EVENING. 90 tablet 0  . verapamil (VERELAN PM) 240 MG 24 hr capsule TAKE ONE CAPSULE BY MOUTH ONCE DAILY. 90 capsule 3   No current facility-administered medications for this visit.   Allergies:  Codeine   ROS:   No dizziness or syncope.  Physical Exam: VS:  BP 126/74   Pulse 72   Temp (!) 97.4 F (36.3 C)   Ht 5\' 3"  (1.6 m)   Wt 182 lb (82.6 kg)   SpO2 97%   BMI 32.24 kg/m , BMI Body mass index is 32.24 kg/m.  Wt Readings from Last 3 Encounters:  12/06/19 182 lb (82.6 kg)  11/17/18 177 lb 12.8 oz (80.6 kg)  11/01/17 178 lb (80.7 kg)    General: Patient appears comfortable at rest. HEENT:  Conjunctiva and lids normal, wearing a mask. Neck: Supple, no elevated JVP or carotid bruits, no thyromegaly. Lungs: Clear to auscultation, nonlabored breathing at rest. Cardiac: Regular rate and rhythm, no S3 or significant systolic murmur, no pericardial rub. Abdomen: Soft, nontender, bowel sounds present. Extremities: No pitting edema, distal pulses 2+.  ECG:  An ECG dated 11/17/2018 was personally reviewed today and demonstrated:  Sinus bradycardia with poor R wave progression.  Recent Labwork:  No interval lab work available for review today.  Other Studies Reviewed Today:  Lexiscan Cardiolite 06/21/2014: FINDINGS: Stress/ECG data: The patient was stressed according to the Lexiscan protocol. The heart rate ranged from 69 to 115 beats per min. The blood pressure averaged 131/88.  The patient experienced mild chest tightness with Lexiscan infusion.  Resting ECG demonstrated normal sinus rhythm. With stress, there were no ischemic ST segment or T-wave abnormalities, nor any arrhythmias.  Perfusion: No decreased activity in the left ventricle on stress imaging to suggest reversible ischemia or infarction.  Wall Motion: Normal left ventricular wall motion. No left ventricular dilation.  Left Ventricular Ejection Fraction: 51 %  End diastolic volume 68 ml  End systolic volume 33 ml  IMPRESSION: 1. No reversible ischemia or infarction.  2. Normal left ventricular wall motion.  3. Left ventricular ejection fraction 51%  4. Low-risk stress test findings*.  Echocardiogram 07/05/2015: Study Conclusions  - Left ventricle: The cavity size was normal. Wall thickness was normal. Systolic function was normal. The estimated ejection fraction was in the range of 60% to 65%. Wall motion was normal; there were no regional wall motion abnormalities. Left ventricular diastolic function parameters were normal. - Aortic valve: There was mild to moderate regurgitation. Regurgitation pressure half-time: 848 ms. - Mitral valve: There was mild regurgitation. - Tricuspid valve: There was mild regurgitation.  Assessment and Plan:  1.  PSVT by history.  She does not report any recurring symptoms on verapamil.  ECG reviewed today and stable.  2.  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  She does not report any exertional chest pain.  3.  Mixed hyperlipidemia, she continues on Pravachol.  Check FLP and LFTs.  Medication Adjustments/Labs and Tests Ordered: Current medicines are reviewed at length with the patient today.  Concerns regarding medicines are outlined above.   Tests Ordered: Orders Placed This Encounter  Procedures  . Lipid Profile  . Hepatic function panel  . EKG 12-Lead     Medication Changes: No orders of the defined types were placed in this encounter.   Disposition:  Follow up 1 year in the [place] office.  Signed, Satira Sark, MD, [facility] 12/06/2019 3:32 PM    [facility] [address] [place], [place] [address] Phone: [phone]; Fax: [phone]

## 2019-12-07 ENCOUNTER — Other Ambulatory Visit (HOSPITAL_COMMUNITY)
Admission: RE | Admit: 2019-12-07 | Discharge: 2019-12-07 | Disposition: A | Discharge status: Home / Self Care | Payer: BC Managed Care – PPO | Source: Ambulatory Visit | Attending: Cardiology | Admitting: Cardiology

## 2019-12-07 ENCOUNTER — Telehealth: Payer: Self-pay

## 2019-12-07 DIAGNOSIS — I471 Supraventricular tachycardia: Secondary | ICD-10-CM | POA: Insufficient documentation

## 2019-12-07 DIAGNOSIS — E782 Mixed hyperlipidemia: Secondary | ICD-10-CM

## 2019-12-07 LAB — LIPID PANEL
Cholesterol: 239 mg/dL — ABNORMAL HIGH (ref 0–200)
HDL: 50 mg/dL (ref >40)
LDL Cholesterol: 124 mg/dL — ABNORMAL HIGH (ref 0–99)
Total CHOL/HDL Ratio: 4.8 RATIO
Triglycerides: 323 mg/dL — ABNORMAL HIGH (ref <150)
VLDL: 65 mg/dL — ABNORMAL HIGH (ref 0–40)

## 2019-12-07 LAB — HEPATIC FUNCTION PANEL
ALT: 24 U/L (ref 0–44)
AST: 26 U/L (ref 15–41)
Albumin: 4.5 g/dL (ref 3.5–5.0)
Alkaline Phosphatase: 121 U/L (ref 38–126)
Bilirubin, Direct: <0.1 mg/dL (ref 0.0–0.2)
Total Bilirubin: 0.6 mg/dL (ref 0.3–1.2)
Total Protein: 7.7 g/dL (ref 6.5–8.1)

## 2019-12-07 MED ORDER — ROSUVASTATIN CALCIUM 20 MG PO TABS: 20.0000 mg | ORAL_TABLET | Freq: Every day | ORAL | 3 refills | Status: DC

## 2019-12-07 NOTE — Telephone Encounter (Signed)
Pt agrees to start crestor 20 mg.I will mail lab slips to her.

## 2019-12-07 NOTE — Telephone Encounter (Signed)
-----   Message from Satira Sark, MD sent at 12/07/2019 12:14 PM EDT ----- Results reviewed.  LFTs are normal.  Cholesterol numbers have gone up higher however, LDL 124.  She is already on Pravachol 40 mg daily.  I think if we doubled that dose to 80 mg daily, we still might not get optimal LDL control.  If she is agreeable, consider switching from Pravachol to Crestor 20 mg daily.  Would then recheck FLP and LFTs in 3 months.

## 2020-02-26 ENCOUNTER — Other Ambulatory Visit: Payer: Self-pay | Admitting: Cardiology

## 2020-03-01 ENCOUNTER — Telehealth: Payer: Self-pay | Admitting: Cardiology

## 2020-03-01 NOTE — Telephone Encounter (Signed)
Pt. Called and said that she was told that she had to re do as lab from march or April. She said she lost her paperwork and was not sure when or if she should come in to have it done.

## 2020-03-01 NOTE — Telephone Encounter (Signed)
Re-mailed labs slip for July lipid and lft

## 2020-03-19 ENCOUNTER — Other Ambulatory Visit: Payer: Self-pay

## 2020-03-19 ENCOUNTER — Other Ambulatory Visit (HOSPITAL_COMMUNITY)
Admission: RE | Admit: 2020-03-19 | Discharge: 2020-03-19 | Disposition: A | Discharge status: Home / Self Care | Payer: BC Managed Care – PPO | Source: Ambulatory Visit | Attending: Cardiology | Admitting: Cardiology

## 2020-03-19 DIAGNOSIS — E782 Mixed hyperlipidemia: Secondary | ICD-10-CM | POA: Diagnosis present

## 2020-03-19 LAB — LIPID PANEL
Cholesterol: 178 mg/dL (ref 0–200)
HDL: 52 mg/dL (ref >40)
LDL Cholesterol: 67 mg/dL (ref 0–99)
Total CHOL/HDL Ratio: 3.4 RATIO
Triglycerides: 296 mg/dL — ABNORMAL HIGH (ref <150)
VLDL: 59 mg/dL — ABNORMAL HIGH (ref 0–40)

## 2020-03-19 LAB — HEPATIC FUNCTION PANEL
ALT: 27 U/L (ref 0–44)
AST: 26 U/L (ref 15–41)
Albumin: 4.6 g/dL (ref 3.5–5.0)
Alkaline Phosphatase: 124 U/L (ref 38–126)
Bilirubin, Direct: <0.1 mg/dL (ref 0.0–0.2)
Total Bilirubin: 0.7 mg/dL (ref 0.3–1.2)
Total Protein: 7.8 g/dL (ref 6.5–8.1)

## 2020-04-24 ENCOUNTER — Telehealth: Payer: Self-pay | Admitting: Cardiology

## 2020-04-24 NOTE — Telephone Encounter (Signed)
Pt states that last night she felt pressure in her chest and decided to check her pulse with the pulse ox. The reading was 50 after getting up to get the pulse ox. Pt denies anymore chest pressure at this time. Heart rate continues to drop in the 40's but is currently in the 80's. Pt does c/o SOB. Pt states that she is so shaky from the steroids that she is unaware it she is having palpitations.

## 2020-04-24 NOTE — Telephone Encounter (Signed)
I wonder if some of her symptoms could be exacerbated by COVID-19.  It does not sound like she is having any fast heart rates to necessarily suggest PSVT however.  If this continues, we could see if we could get a 72-hour ZIO XT mailed to her to investigate rhythm further.  Pulse oximetry sometimes can give an inaccurate heart rate.

## 2020-04-24 NOTE — Telephone Encounter (Signed)
Pt notified and voiced understanding 

## 2020-04-24 NOTE — Telephone Encounter (Signed)
Pt states she has an irregular HR past 24hr.  Pt states she presently has COVID.    Please call [phone]   Thanks renee

## 2020-04-27 ENCOUNTER — Other Ambulatory Visit: Payer: Self-pay

## 2020-04-27 ENCOUNTER — Emergency Department (HOSPITAL_COMMUNITY): Payer: BC Managed Care – PPO

## 2020-04-27 ENCOUNTER — Encounter (HOSPITAL_COMMUNITY): Payer: Self-pay

## 2020-04-27 ENCOUNTER — Emergency Department (HOSPITAL_COMMUNITY)
Admission: EM | Admit: 2020-04-27 | Discharge: 2020-04-27 | Disposition: A | Discharge status: Home / Self Care | Payer: BC Managed Care – PPO | Source: Home / Self Care | Attending: Emergency Medicine | Admitting: Emergency Medicine

## 2020-04-27 DIAGNOSIS — I471 Supraventricular tachycardia: Secondary | ICD-10-CM | POA: Insufficient documentation

## 2020-04-27 DIAGNOSIS — Z7982 Long term (current) use of aspirin: Secondary | ICD-10-CM | POA: Insufficient documentation

## 2020-04-27 DIAGNOSIS — Y92012 Bathroom of single-family (private) house as the place of occurrence of the external cause: Secondary | ICD-10-CM | POA: Insufficient documentation

## 2020-04-27 DIAGNOSIS — Y999 Unspecified external cause status: Secondary | ICD-10-CM | POA: Insufficient documentation

## 2020-04-27 DIAGNOSIS — Y9301 Activity, walking, marching and hiking: Secondary | ICD-10-CM | POA: Insufficient documentation

## 2020-04-27 DIAGNOSIS — S82892A Other fracture of left lower leg, initial encounter for closed fracture: Secondary | ICD-10-CM

## 2020-04-27 DIAGNOSIS — W19XXXA Unspecified fall, initial encounter: Secondary | ICD-10-CM | POA: Insufficient documentation

## 2020-04-27 DIAGNOSIS — Z79899 Other long term (current) drug therapy: Secondary | ICD-10-CM | POA: Insufficient documentation

## 2020-04-27 DIAGNOSIS — Z8616 Personal history of COVID-19: Secondary | ICD-10-CM | POA: Insufficient documentation

## 2020-04-27 DIAGNOSIS — J45909 Unspecified asthma, uncomplicated: Secondary | ICD-10-CM | POA: Insufficient documentation

## 2020-04-27 DIAGNOSIS — S82842A Displaced bimalleolar fracture of left lower leg, initial encounter for closed fracture: Secondary | ICD-10-CM | POA: Insufficient documentation

## 2020-04-27 LAB — CBG MONITORING, ED: Glucose-Capillary: 110 mg/dL — ABNORMAL HIGH (ref 70–99)

## 2020-04-27 MED ORDER — LIDOCAINE HCL (PF) 1 % IJ SOLN: 5.0000 mL | Freq: Once | INTRAMUSCULAR | Status: DC

## 2020-04-27 MED ORDER — BUPIVACAINE HCL (PF) 0.25 % IJ SOLN: 10.0000 mL | Freq: Once | INTRAMUSCULAR | Status: DC

## 2020-04-27 MED ORDER — LIDOCAINE-EPINEPHRINE (PF) 2 %-1:200000 IJ SOLN: 20.0000 mL | Freq: Once | INTRAMUSCULAR | Status: DC

## 2020-04-27 MED ORDER — SODIUM CHLORIDE 0.9 % IV BOLUS
1000.0000 mL | Freq: Once | INTRAVENOUS | Status: AC
  Administered 2020-04-27: 1000 mL via INTRAVENOUS

## 2020-04-27 MED ORDER — HYDROCODONE-ACETAMINOPHEN 5-325 MG PO TABS
1.0000 | ORAL_TABLET | Freq: Once | ORAL | Status: AC
  Administered 2020-04-27: 1 via ORAL
  Filled 2020-04-27: qty 1

## 2020-04-27 MED ORDER — ONDANSETRON HCL 4 MG/2ML IJ SOLN
4.0000 mg | Freq: Once | INTRAMUSCULAR | Status: AC
  Administered 2020-04-27: 4 mg via INTRAVENOUS
  Filled 2020-04-27: qty 2

## 2020-04-27 MED ORDER — ONDANSETRON 4 MG PO TBDP: 4.0000 mg | ORAL_TABLET | Freq: Three times a day (TID) | ORAL | 0 refills | Status: DC | PRN

## 2020-04-27 MED ORDER — FENTANYL CITRATE (PF) 100 MCG/2ML IJ SOLN
50.0000 ug | Freq: Once | INTRAMUSCULAR | Status: DC
  Filled 2020-04-27: qty 2

## 2020-04-27 MED ORDER — HYDROCODONE-ACETAMINOPHEN 5-325 MG PO TABS: 1.0000 | ORAL_TABLET | Freq: Four times a day (QID) | ORAL | 0 refills | Status: DC | PRN

## 2020-04-27 MED ORDER — PROPOFOL 10 MG/ML IV BOLUS
1.0000 mg/kg | Freq: Once | INTRAVENOUS | Status: AC
  Administered 2020-04-27: 80.7 mg via INTRAVENOUS
  Filled 2020-04-27: qty 20

## 2020-04-27 NOTE — Discharge Instructions (Signed)
You can take Tylenol or Ibuprofen as directed for pain. You can alternate Tylenol and Ibuprofen every 4 hours. If you take Tylenol at 1pm, then you can take Ibuprofen at 5pm. Then you can take Tylenol again at 9pm.   Take pain medications as directed for break through pain. Do not drive or operate machinery while taking this medication.   Keep the foot elevated to help with pain and swelling.  You should not bear any weight on the foot.  Use crutches at all time.  Do not get the splint wet.  As we discussed, you will have to follow-up with an orthopedic doctor.  Call his office on Monday to arrange for follow-up appointment this week.  Return the emergency room for any worsening pain, discoloration of the toes, numbness of the toes or any other worsening concerning symptoms.

## 2020-04-27 NOTE — ED Provider Notes (Signed)
[facility] EMERGENCY DEPARTMENT Provider Note   CSN: [id number] Arrival date & time: 04/27/20  1308     History Chief Complaint  Patient presents with  . Ankle Pain    Debbie Allen is a 54 y.o. female past with history of PSVT, benign brain tumor, asthma with recent Covid positive brought in by EMS for evaluation of left ankle pain. Patient reports that she had gone to the bathroom and states that while she was using the bathroom, she felt lightheaded, like she was going to pass out. She states she stood up from the bathroom and tried to walk towards her bed but states that she had a syncopal episode. He does endorse feeling lightheaded prior to syncope. No preceding chest pain. She is unsure if she hit her head but states that she did hit the wall in the bed and hurt her left ankle crack. She is not on blood thinners. She reports she has not been able to ambulate or put weight on the left ankle since this occurred. She denies any vision changes, nausea/vomiting, numbness/weakness, chest pain, difficulty breathing, abdominal pain.  The history is provided by the patient.       Past Medical History:  Diagnosis Date  . Acid reflux   . Aortic regurgitation    Asymptomatic  . Asthma   . Brain tumor (benign) ([place])   . 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 PSVT (paroxysmal supraventricular tachycardia) (HCC)    Palpitations - suspected diagnosis (LGL postulated by Dr. Debara Pickett but never documented)    Patient Active Problem List   Diagnosis Date Noted  . 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 Chest pain 06/15/2014  . Aortic regurgitation 04/16/2014  . Palpitations 10/19/2013  . PSVT (paroxysmal supraventricular tachycardia) ([place]) 10/19/2013  . Dyslipidemia 10/19/2013    Past Surgical History:  Procedure Laterality Date  . COLONOSCOPY   07/2019  . DILATION AND CURETTAGE OF UTERUS    . LEFT HEART CATH       OB History    Gravida  2   Para  2   Term  2   Preterm      AB      Living  2     SAB      TAB      Ectopic      Multiple      Live Births              Family History  Problem Relation Age of Onset  . Heart disease Father 12  . Hyperlipidemia Father   . Hyperlipidemia Sister   . Hypertension Paternal Grandmother   . Heart disease Paternal Grandfather   . Heart attack Paternal Grandfather     Social History   Tobacco Use  . Smoking status: Never Smoker  . Smokeless tobacco: Never Used  Vaping Use  . Vaping Use: Never used  Substance Use Topics  . Alcohol use: Yes    Alcohol/week: 0.0 standard drinks    Comment: Occasional  . Drug use: No    Home Medications Prior to Admission medications   Medication Sig Start Date End Date Taking? Authorizing Provider  albuterol (PROVENTIL HFA;VENTOLIN HFA) 108 (90 BASE) MCG/ACT inhaler Inhale 2 puffs into the lungs every 6 (six) hours as needed for wheezing or shortness of breath.    [provider]  aspirin EC 81 MG tablet Take 1 tablet (81 mg total) by mouth daily. 01/29/17   Dunn, Nedra Hai, PA-C  budesonide-formoterol (SYMBICORT) 160-4.5 MCG/ACT inhaler Inhale 2 puffs into the lungs 2 (two) times daily as needed.    [provider]  esomeprazole (NEXIUM) 20 MG capsule Take 20 mg by mouth daily at 12 noon.    [provider]  HYDROcodone-acetaminophen (NORCO/VICODIN) 5-325 MG tablet Take 1-2 tablets by mouth every 6 (six) hours as needed. 04/27/20   Volanda Napoleon, PA-C  levocetirizine (XYZAL) 5 MG tablet Take 5 mg by mouth daily. 10/01/13   [provider]  metoprolol tartrate (LOPRESSOR) 25 MG tablet TAKE ONE TABLET BY MOUTH 2 TIMES A DAY. 08/01/19   Arnoldo Lenis, MD  montelukast (SINGULAIR) 10 MG tablet Take 1 tablet by mouth daily. 10/01/13   [provider]  Olopatadine HCl 0.6 % SOLN  Place into the nose as needed.    [provider]  ondansetron (ZOFRAN ODT) 4 MG disintegrating tablet Take 1 tablet (4 mg total) by mouth every 8 (eight) hours as needed for nausea or vomiting. 04/27/20   Providence Lanius A, PA-C  potassium chloride SA (KLOR-CON) 20 MEQ tablet TAKE 2 TABLETS BY MOUTH DAILY. 02/26/20   Satira Sark, MD  rosuvastatin (CRESTOR) 20 MG tablet Take 1 tablet (20 mg total) by mouth daily. 12/07/19 03/06/20  Satira Sark, MD  verapamil (VERELAN PM) 240 MG 24 hr capsule TAKE ONE CAPSULE BY MOUTH ONCE DAILY. 08/21/19   Satira Sark, MD    Allergies    Codeine  Review of Systems   Review of Systems  Constitutional: Negative for fever.  Respiratory: Negative for cough and shortness of breath.   Cardiovascular: Negative for chest pain.  Gastrointestinal: Negative for abdominal pain, nausea and vomiting.  Musculoskeletal:       Left ankle pain  Neurological: Positive for syncope. Negative for weakness, numbness and headaches.  All other systems reviewed and are negative.   Physical Exam Updated Vital Signs BP 117/70 (BP Location: Left Arm)   Pulse 62   Temp 97.8 F (36.6 C) (Oral)   Resp 13   Ht 5\' 2"  (1.575 m)   Wt 80.7 kg   SpO2 92%   BMI 32.56 kg/m   Physical Exam Vitals and nursing note reviewed.  Constitutional:      Appearance: Normal appearance. She is well-developed.  HENT:     Head: Normocephalic and atraumatic.     Comments: No tenderness to palpation of skull. No deformities or crepitus noted. No open wounds, abrasions or lacerations.  Eyes:     General: Lids are normal.     Conjunctiva/sclera: Conjunctivae normal.     Pupils: Pupils are equal, round, and reactive to light.     Comments: PERRL. EOMs intact. No nystagmus. No neglect.   Neck:     Comments: Full flexion/extension and lateral movement of neck fully intact. No bony midline tenderness. No deformities or crepitus.  Cardiovascular:     Rate and Rhythm:  Regular rhythm. Bradycardia present.     Pulses: Normal pulses.          Radial pulses are 2+ on the right side and 2+ on the left side.       Dorsalis pedis pulses are 2+ on the right side and 2+ on the left side.     Heart sounds: Normal heart sounds. No murmur heard.  No friction rub. No gallop.  Pulmonary:     Effort: Pulmonary effort is normal.     Breath sounds: Normal breath sounds.     Comments: Lungs clear to auscultation bilaterally.  Symmetric chest rise.  No wheezing, rales, rhonchi. Abdominal:     Palpations: Abdomen is soft. Abdomen is not rigid.     Tenderness: There is no abdominal tenderness. There is no guarding.  Musculoskeletal:        General: Normal range of motion.     Cervical back: Full passive range of motion without pain.     Comments: Tenderness palpation in left ankle with obvious deformity. Limited range of motion secondary to pain. No bony tenderness noted proximal tib-fib, left knee, left femur. No pelvic instability. No tenderness palpation of the right lower extremity. She is able to wiggle all 5 toes of the left foot.  Skin:    General: Skin is warm and dry.     Capillary Refill: Capillary refill takes less than 2 seconds.     Comments: Good distal cap refill.  LLE is not dusky in appearance or cool to touch.  Neurological:     Mental Status: She is alert and oriented to person, place, and time.     Comments: Sensation intact along major nerve distributions of LLE  Psychiatric:        Speech: Speech normal.     ED Results / Procedures / Treatments   Labs (all labs ordered are listed, but only abnormal results are displayed) Labs Reviewed  CBG MONITORING, ED - Abnormal; Notable for the following components:      Result Value   Glucose-Capillary 110 (*)    All other components within normal limits  I-STAT BETA HCG BLOOD, ED ([facility], [facility], [facility] ONLY)    EKG EKG Interpretation  Date/Time:  Saturday April 27 2020 13:25:19 EDT Ventricular Rate:   49 PR Interval:    QRS Duration: 101 QT Interval:  495 QTC Calculation: 447 R Axis:   4 Text Interpretation: Sinus bradycardia Short PR interval Confirmed by Virgel Manifold [id number]) on 04/27/2020 2:30:00 PM   Radiology DG Ankle Complete Left  Result Date: 04/27/2020 CLINICAL DATA:  The patient suffered left medial and lateral malleolar fractures in a fall today. Post reduction imaging. Initial encounter. EXAM: LEFT ANKLE COMPLETE - 3+ VIEW COMPARISON:  Plain films of the left ankle earlier today. FINDINGS: The patient is now in a fiberglass cast. Position and alignment of the patient's medial and lateral malleolar fractures are markedly improved. No new abnormality. IMPRESSION: Marked improvement in position and alignment of medial and lateral malleolar fractures. No new abnormality. Electronically Signed   By: Inge Rise M.D.   On: 04/27/2020 15:24   DG Ankle Complete Left  Result Date: 04/27/2020 CLINICAL DATA:  The patient suffered a left ankle injury in a fall today. Initial encounter. EXAM: LEFT ANKLE COMPLETE - 3+ VIEW COMPARISON:  None. FINDINGS: The patient has acute medial and lateral malleolar fractures. Both fractures are laterally displaced approximately 1 shaft width. The talus is laterally subluxed 1.1 cm. No other acute bony or joint abnormality is identified. There is soft tissue swelling about the patient's fractures. Pins for hallux valgus repair in the distal first metatarsal noted. IMPRESSION: Acute, displaced medial and lateral malleolar fractures as described. Electronically Signed   By: Inge Rise M.D.   On: 04/27/2020 14:11    Procedures Reduction of dislocation  Date/Time: 04/27/2020 3:00 PM Performed by: Volanda Napoleon, PA-C Authorized by: Volanda Napoleon, PA-C  Consent: Verbal consent obtained. Written consent obtained. Risks and benefits: risks, benefits and alternatives were discussed (tendon damage, ligament damage, nerve damage,  fracture) Consent given by: patient Patient understanding: patient states understanding of the procedure being performed Patient consent: the patient's understanding of the procedure matches consent given Procedure consent: procedure consent matches procedure scheduled Relevant documents: relevant documents present and verified Test results: test results available and properly labeled Site marked: the operative site was marked Imaging studies: imaging studies available Patient identity confirmed: verbally with patient and arm band Time out: Immediately prior to procedure a "time out" was called to verify the correct patient, procedure, equipment, support staff and site/side marked as required. Patient tolerance: patient tolerated the procedure well with no immediate complications  .Splint Application  Date/Time: 04/27/2020 3:00 PM Performed by: Volanda Napoleon, PA-C Authorized by: Volanda Napoleon, PA-C   Consent:    Consent obtained:  Emergent situation   Consent given by:  Patient   Risks discussed:  Discoloration, numbness, pain and swelling Pre-procedure details:    Sensation:  Normal Procedure details:    Laterality:  Left   Location:  Ankle   Ankle:  L ankle   Splint type:  Ankle stirrup   Supplies:  Elastic bandage, cotton padding and Ortho-Glass Post-procedure details:    Pain:  Improved   Sensation:  Normal   Patient tolerance of procedure:  Tolerated well, no immediate complications Comments:     Patient with good distal cap refill and sensation after splint placement.    (including critical care time)  Medications Ordered in ED Medications  fentaNYL (SUBLIMAZE) injection 50 mcg (0 mcg Intravenous Hold 04/27/20 1438)  ondansetron (ZOFRAN) injection 4 mg (4 mg Intravenous Given 04/27/20 1438)  sodium chloride 0.9 % bolus 1,000 mL (0 mLs Intravenous Stopped 04/27/20 1555)  propofol (DIPRIVAN) 10 mg/mL bolus/IV push 80.7 mg (80.7 mg Intravenous Given 04/27/20 1447)   HYDROcodone-acetaminophen (NORCO/VICODIN) 5-325 MG per tablet 1 tablet (1 tablet Oral Given 04/27/20 1555)    ED Course  I have reviewed the triage vital signs and the nursing notes.  Pertinent labs & imaging results that were available during my care of the patient were reviewed by me and considered in my medical decision making (see chart for details).    MDM Rules/Calculators/A&P                          54 year old female who presents for evaluation of left ankle pain after a fall. Patient reported syncopal episode of her left getting up from the bathroom and trying to walk to the bed. On initial arrival, she is afebrile, nontoxic-appearing. Vital signs are stable. She is slightly bradycardic with HR in the 50s. Review of her vitals show that this is her baseline. She is neurovascularly intact. Patient with obvious left ankle deformity. Concern for dislocation versus fracture. X-rays ordered. Patient is not on blood thinners. She does not think she hit her head but is unsure. She states she may have hit it against the wall. No neuro deficits on exam. Given reassuring physical exam and per [facility] CT criteria, no imaging is indicated at this time. I suspect her syncopal episode was a combo of vasovagal syncope after using the bathroom by the fact that she has not been eating much.  XR shows acute displaced medial and lateral malleolar fractures which are laterally displaced approximately 1 shaft width.   Discussed results with patient.  I discussed  with her given the nature of her injury, she would need a reduction here in the ED.  We discussed treatment options, including hematoma block versus procedural sedation.  After discussing with patient, she opted for procedural sedation.  Reduction and splinting as documented above.  Please see attending note for separate procedural sedation.   Post reduction film shows improvement in position.   Discussed patient with Dr. Delfino Lovett (Ortho).  Agrees with plan and would like patient to call the office on Monday for outpatient follow-up.   Re-evaluation. Patient is resting comfortably with no signs of distress. She is alert and oriented. Patient instructed on splint care and given precautions for compartment syndrome.Will give short course of pain medication for acute pain. Patient reviewed on PMP. She has tolerated hydrocodone in the past. Instructed patient to follow up with orthopedics. At this time, patient exhibits no emergent life-threatening condition that require further evaluation in ED. Patient had ample opportunity for questions and discussion. All patient's questions were answered with full understanding. Strict return precautions discussed. Patient expresses understanding and agreement to plan.   Portions of this note were generated with Lobby[employer]. Dictation errors may occur despite best attempts at proofreading.   Final Clinical Impression(s) / ED Diagnoses Final diagnoses:  Closed fracture of left ankle, initial encounter    Rx / DC Orders ED Discharge Orders         Ordered    HYDROcodone-acetaminophen (NORCO/VICODIN) 5-325 MG tablet  Every 6 hours PRN        04/27/20 1533    ondansetron (ZOFRAN ODT) 4 MG disintegrating tablet  Every 8 hours PRN        04/27/20 1533           Desma Mcgregor 04/27/20 1713    Virgel Manifold, MD 04/30/20 1114

## 2020-04-27 NOTE — ED Triage Notes (Signed)
Pt reports getting up to BR and when she came back to BR she passed out, but heard left ankle crack. Pt dx with covid on Monday. Pt given 75 mcg of fentanyl. Deformity noted to left ankle

## 2020-04-29 ENCOUNTER — Telehealth (HOSPITAL_COMMUNITY): Payer: Self-pay | Admitting: Physician Assistant

## 2020-04-29 ENCOUNTER — Telehealth (HOSPITAL_COMMUNITY): Payer: Self-pay | Admitting: Nurse Practitioner

## 2020-04-29 MED ORDER — HYDROCODONE-ACETAMINOPHEN 5-325 MG PO TABS: 1.0000 | ORAL_TABLET | ORAL | 0 refills | Status: DC | PRN

## 2020-04-29 NOTE — Telephone Encounter (Signed)
Called to discuss with Debbie [facility] about Covid symptoms and the use of casirivimab/imdevimab, a combination monoclonal antibody infusion for those with mild to moderate Covid symptoms and at a high risk of hospitalization.     Pt does not qualify for infusion therapy as her symptoms first presented > 10 days (04/20/20) prior to timing of infusion. Symptoms tier reviewed as well as criteria for ending isolation. Preventative practices reviewed. Patient verbalized understanding    Patient Active Problem List   Diagnosis Date Noted  . 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 Chest pain 06/15/2014  . Aortic regurgitation 04/16/2014  . Palpitations 10/19/2013  . PSVT (paroxysmal supraventricular tachycardia) ([place]) 10/19/2013  . Dyslipidemia 10/19/2013    Alda Lea, AGPCNP-BC

## 2020-04-30 ENCOUNTER — Emergency Department (HOSPITAL_COMMUNITY): Payer: BC Managed Care – PPO

## 2020-04-30 ENCOUNTER — Inpatient Hospital Stay (HOSPITAL_COMMUNITY)
Admission: EM | Admit: 2020-04-30 | Discharge: 2020-05-14 | DRG: 981 | Disposition: A | Discharge status: Home Health | Payer: BC Managed Care – PPO | Attending: Internal Medicine | Admitting: Internal Medicine

## 2020-04-30 ENCOUNTER — Other Ambulatory Visit: Payer: Self-pay

## 2020-04-30 ENCOUNTER — Encounter (HOSPITAL_COMMUNITY): Payer: Self-pay

## 2020-04-30 DIAGNOSIS — R0602 Shortness of breath: Secondary | ICD-10-CM

## 2020-04-30 DIAGNOSIS — I959 Hypotension, unspecified: Secondary | ICD-10-CM | POA: Diagnosis present

## 2020-04-30 DIAGNOSIS — K219 Gastro-esophageal reflux disease without esophagitis: Secondary | ICD-10-CM | POA: Diagnosis present

## 2020-04-30 DIAGNOSIS — U071 COVID-19: Secondary | ICD-10-CM | POA: Diagnosis not present

## 2020-04-30 DIAGNOSIS — E876 Hypokalemia: Secondary | ICD-10-CM | POA: Diagnosis present

## 2020-04-30 DIAGNOSIS — J1282 Pneumonia due to coronavirus disease 2019: Secondary | ICD-10-CM | POA: Diagnosis present

## 2020-04-30 DIAGNOSIS — Z79899 Other long term (current) drug therapy: Secondary | ICD-10-CM

## 2020-04-30 DIAGNOSIS — J9601 Acute respiratory failure with hypoxia: Secondary | ICD-10-CM | POA: Diagnosis not present

## 2020-04-30 DIAGNOSIS — I471 Supraventricular tachycardia: Secondary | ICD-10-CM | POA: Diagnosis present

## 2020-04-30 DIAGNOSIS — W1811XA Fall from or off toilet without subsequent striking against object, initial encounter: Secondary | ICD-10-CM | POA: Diagnosis present

## 2020-04-30 DIAGNOSIS — S82842A Displaced bimalleolar fracture of left lower leg, initial encounter for closed fracture: Secondary | ICD-10-CM | POA: Diagnosis present

## 2020-04-30 DIAGNOSIS — Y92012 Bathroom of single-family (private) house as the place of occurrence of the external cause: Secondary | ICD-10-CM | POA: Diagnosis not present

## 2020-04-30 DIAGNOSIS — Z86011 Personal history of benign neoplasm of the brain: Secondary | ICD-10-CM

## 2020-04-30 DIAGNOSIS — S82892A Other fracture of left lower leg, initial encounter for closed fracture: Secondary | ICD-10-CM | POA: Diagnosis present

## 2020-04-30 DIAGNOSIS — I351 Nonrheumatic aortic (valve) insufficiency: Secondary | ICD-10-CM | POA: Diagnosis not present

## 2020-04-30 DIAGNOSIS — S82892S Other fracture of left lower leg, sequela: Secondary | ICD-10-CM | POA: Diagnosis not present

## 2020-04-30 DIAGNOSIS — Z7982 Long term (current) use of aspirin: Secondary | ICD-10-CM | POA: Diagnosis not present

## 2020-04-30 DIAGNOSIS — I2510 Atherosclerotic heart disease of native coronary artery without angina pectoris: Secondary | ICD-10-CM | POA: Diagnosis present

## 2020-04-30 DIAGNOSIS — E785 Hyperlipidemia, unspecified: Secondary | ICD-10-CM | POA: Diagnosis present

## 2020-04-30 DIAGNOSIS — Z6832 Body mass index (BMI) 32.0-32.9, adult: Secondary | ICD-10-CM | POA: Diagnosis not present

## 2020-04-30 DIAGNOSIS — E860 Dehydration: Secondary | ICD-10-CM | POA: Diagnosis present

## 2020-04-30 DIAGNOSIS — J4520 Mild intermittent asthma, uncomplicated: Secondary | ICD-10-CM | POA: Diagnosis present

## 2020-04-30 DIAGNOSIS — E669 Obesity, unspecified: Secondary | ICD-10-CM | POA: Diagnosis present

## 2020-04-30 LAB — COMPREHENSIVE METABOLIC PANEL
ALT: 26 U/L (ref 0–44)
AST: 45 U/L — ABNORMAL HIGH (ref 15–41)
Albumin: 3 g/dL — ABNORMAL LOW (ref 3.5–5.0)
Alkaline Phosphatase: 80 U/L (ref 38–126)
Anion gap: 10 (ref 5–15)
BUN: 14 mg/dL (ref 6–20)
CO2: 24 mmol/L (ref 22–32)
Calcium: 8.4 mg/dL — ABNORMAL LOW (ref 8.9–10.3)
Chloride: 100 mmol/L (ref 98–111)
Creatinine, Ser: 0.86 mg/dL (ref 0.44–1.00)
GFR calc Af Amer: >60 mL/min (ref >60)
GFR calc non Af Amer: >60 mL/min (ref >60)
Glucose, Bld: 130 mg/dL — ABNORMAL HIGH (ref 70–99)
Potassium: 4.3 mmol/L (ref 3.5–5.1)
Sodium: 134 mmol/L — ABNORMAL LOW (ref 135–145)
Total Bilirubin: 0.1 mg/dL — ABNORMAL LOW (ref 0.3–1.2)
Total Protein: 6.3 g/dL — ABNORMAL LOW (ref 6.5–8.1)

## 2020-04-30 LAB — CBC WITH DIFFERENTIAL/PLATELET
Abs Immature Granulocytes: 0.19 10*3/uL — ABNORMAL HIGH (ref 0.00–0.07)
Basophils Absolute: 0 10*3/uL (ref 0.0–0.1)
Basophils Relative: 0 %
Eosinophils Absolute: 0 10*3/uL (ref 0.0–0.5)
Eosinophils Relative: 0 %
HCT: 39.6 % (ref 36.0–46.0)
Hemoglobin: 12.4 g/dL (ref 12.0–15.0)
Immature Granulocytes: 2 %
Lymphocytes Relative: 7 %
Lymphs Abs: 0.7 10*3/uL (ref 0.7–4.0)
MCH: 27.9 pg (ref 26.0–34.0)
MCHC: 31.3 g/dL (ref 30.0–36.0)
MCV: 89 fL (ref 80.0–100.0)
Monocytes Absolute: 0.4 10*3/uL (ref 0.1–1.0)
Monocytes Relative: 4 %
Neutro Abs: 8.9 10*3/uL — ABNORMAL HIGH (ref 1.7–7.7)
Neutrophils Relative %: 87 %
Platelets: 213 10*3/uL (ref 150–400)
RBC: 4.45 MIL/uL (ref 3.87–5.11)
RDW: 13.4 % (ref 11.5–15.5)
WBC: 10.2 10*3/uL (ref 4.0–10.5)
nRBC: 0 % (ref 0.0–0.2)

## 2020-04-30 LAB — FIBRINOGEN: Fibrinogen: 685 mg/dL — ABNORMAL HIGH (ref 210–475)

## 2020-04-30 LAB — LACTIC ACID, PLASMA
Lactic Acid, Venous: 1.5 mmol/L (ref 0.5–1.9)
Lactic Acid, Venous: 1.9 mmol/L (ref 0.5–1.9)

## 2020-04-30 LAB — D-DIMER, QUANTITATIVE: D-Dimer, Quant: 1.67 ug/mL-FEU — ABNORMAL HIGH (ref 0.00–0.50)

## 2020-04-30 LAB — C-REACTIVE PROTEIN: CRP: 12.6 mg/dL — ABNORMAL HIGH (ref <1.0)

## 2020-04-30 LAB — LACTATE DEHYDROGENASE: LDH: 487 U/L — ABNORMAL HIGH (ref 98–192)

## 2020-04-30 LAB — SARS CORONAVIRUS 2 BY RT PCR (HOSPITAL ORDER, PERFORMED IN ~~LOC~~ HOSPITAL LAB): SARS Coronavirus 2: POSITIVE — AB

## 2020-04-30 LAB — I-STAT BETA HCG BLOOD, ED (MC, WL, AP ONLY): I-stat hCG, quantitative: <5 m[IU]/mL (ref <5)

## 2020-04-30 LAB — HEPATITIS B SURFACE ANTIGEN: Hepatitis B Surface Ag: NONREACTIVE

## 2020-04-30 LAB — TRIGLYCERIDES: Triglycerides: 252 mg/dL — ABNORMAL HIGH (ref <150)

## 2020-04-30 LAB — FERRITIN: Ferritin: 350 ng/mL — ABNORMAL HIGH (ref 11–307)

## 2020-04-30 LAB — TROPONIN I (HIGH SENSITIVITY): Troponin I (High Sensitivity): 8 ng/L (ref <18)

## 2020-04-30 LAB — HIV ANTIBODY (ROUTINE TESTING W REFLEX): HIV Screen 4th Generation wRfx: NONREACTIVE

## 2020-04-30 LAB — BRAIN NATRIURETIC PEPTIDE: B Natriuretic Peptide: 28.7 pg/mL (ref 0.0–100.0)

## 2020-04-30 LAB — PROCALCITONIN: Procalcitonin: <0.1 ng/mL

## 2020-04-30 LAB — ABO/RH: ABO/RH(D): O POS

## 2020-04-30 MED ORDER — MOMETASONE FURO-FORMOTEROL FUM 200-5 MCG/ACT IN AERO
2.0000 | INHALATION_SPRAY | Freq: Two times a day (BID) | RESPIRATORY_TRACT | Status: DC
  Administered 2020-05-02 – 2020-05-14 (×24): 2 via RESPIRATORY_TRACT
  Filled 2020-04-30 (×2): qty 8.8

## 2020-04-30 MED ORDER — SODIUM CHLORIDE 0.9 % IV BOLUS
500.0000 mL | Freq: Once | INTRAVENOUS | Status: AC
  Administered 2020-04-30: 500 mL via INTRAVENOUS

## 2020-04-30 MED ORDER — METHYLPREDNISOLONE SODIUM SUCC 40 MG IJ SOLR
40.0000 mg | Freq: Two times a day (BID) | INTRAMUSCULAR | Status: DC
  Administered 2020-04-30 – 2020-05-01 (×2): 40 mg via INTRAVENOUS
  Filled 2020-04-30 (×2): qty 1

## 2020-04-30 MED ORDER — ONDANSETRON HCL 4 MG PO TABS: 4.0000 mg | ORAL_TABLET | Freq: Four times a day (QID) | ORAL | Status: DC | PRN

## 2020-04-30 MED ORDER — AEROCHAMBER PLUS FLO-VU MEDIUM MISC
1.0000 | Freq: Once | Status: DC
  Filled 2020-04-30 (×3): qty 1

## 2020-04-30 MED ORDER — ALBUTEROL SULFATE HFA 108 (90 BASE) MCG/ACT IN AERS
2.0000 | INHALATION_SPRAY | Freq: Four times a day (QID) | RESPIRATORY_TRACT | Status: DC | PRN
  Filled 2020-04-30: qty 6.7

## 2020-04-30 MED ORDER — ROSUVASTATIN CALCIUM 20 MG PO TABS
20.0000 mg | ORAL_TABLET | Freq: Every day | ORAL | Status: DC
  Administered 2020-04-30 – 2020-05-14 (×14): 20 mg via ORAL
  Filled 2020-04-30 (×3): qty 1
  Filled 2020-04-30: qty 4
  Filled 2020-04-30 (×10): qty 1

## 2020-04-30 MED ORDER — MONTELUKAST SODIUM 10 MG PO TABS
10.0000 mg | ORAL_TABLET | Freq: Every day | ORAL | Status: DC
  Administered 2020-04-30 – 2020-05-14 (×14): 10 mg via ORAL
  Filled 2020-04-30 (×14): qty 1

## 2020-04-30 MED ORDER — SODIUM CHLORIDE 0.9 % IV SOLN
200.0000 mg | Freq: Once | INTRAVENOUS | Status: AC
  Administered 2020-04-30: 200 mg via INTRAVENOUS
  Filled 2020-04-30: qty 40

## 2020-04-30 MED ORDER — ENOXAPARIN SODIUM 40 MG/0.4ML ~~LOC~~ SOLN
40.0000 mg | SUBCUTANEOUS | Status: DC
  Administered 2020-04-30 – 2020-05-13 (×14): 40 mg via SUBCUTANEOUS
  Filled 2020-04-30 (×14): qty 0.4

## 2020-04-30 MED ORDER — SODIUM CHLORIDE 0.9 % IV SOLN
100.0000 mg | Freq: Every day | INTRAVENOUS | Status: AC
  Administered 2020-05-01 – 2020-05-04 (×4): 100 mg via INTRAVENOUS
  Filled 2020-04-30 (×3): qty 20
  Filled 2020-04-30: qty 100

## 2020-04-30 MED ORDER — SODIUM CHLORIDE 0.9 % IV BOLUS: 500.0000 mL | Freq: Once | INTRAVENOUS | Status: DC

## 2020-04-30 MED ORDER — SODIUM CHLORIDE 0.9 % IV BOLUS: 1000.0000 mL | Freq: Once | INTRAVENOUS | Status: DC

## 2020-04-30 MED ORDER — DEXAMETHASONE SODIUM PHOSPHATE 10 MG/ML IJ SOLN
10.0000 mg | Freq: Once | INTRAMUSCULAR | Status: DC
  Administered 2020-04-30: 10 mg via INTRAVENOUS
  Filled 2020-04-30: qty 1

## 2020-04-30 MED ORDER — ACETAMINOPHEN 325 MG PO TABS
650.0000 mg | ORAL_TABLET | Freq: Four times a day (QID) | ORAL | Status: DC | PRN
  Administered 2020-04-30 – 2020-05-14 (×26): 650 mg via ORAL
  Filled 2020-04-30 (×26): qty 2

## 2020-04-30 MED ORDER — ASPIRIN EC 81 MG PO TBEC
81.0000 mg | DELAYED_RELEASE_TABLET | Freq: Every day | ORAL | Status: DC
  Administered 2020-05-01 – 2020-05-14 (×13): 81 mg via ORAL
  Filled 2020-04-30 (×13): qty 1

## 2020-04-30 MED ORDER — ONDANSETRON HCL 4 MG/2ML IJ SOLN
4.0000 mg | Freq: Four times a day (QID) | INTRAMUSCULAR | Status: DC | PRN
  Administered 2020-04-30 – 2020-05-01 (×2): 4 mg via INTRAVENOUS
  Filled 2020-04-30 (×2): qty 2

## 2020-04-30 MED ORDER — PANTOPRAZOLE SODIUM 40 MG PO TBEC
40.0000 mg | DELAYED_RELEASE_TABLET | Freq: Every day | ORAL | Status: DC
  Administered 2020-04-30 – 2020-05-14 (×14): 40 mg via ORAL
  Filled 2020-04-30 (×14): qty 1

## 2020-04-30 MED ORDER — ALBUTEROL SULFATE HFA 108 (90 BASE) MCG/ACT IN AERS
6.0000 | INHALATION_SPRAY | Freq: Once | RESPIRATORY_TRACT | Status: AC
  Administered 2020-04-30: 6 via RESPIRATORY_TRACT
  Filled 2020-04-30: qty 6.7

## 2020-04-30 MED ORDER — IPRATROPIUM BROMIDE HFA 17 MCG/ACT IN AERS
2.0000 | INHALATION_SPRAY | Freq: Once | RESPIRATORY_TRACT | Status: AC
  Administered 2020-04-30: 2 via RESPIRATORY_TRACT
  Filled 2020-04-30: qty 12.9

## 2020-04-30 NOTE — ED Provider Notes (Signed)
[facility] Provider Note   CSN: [id number] Arrival date & time: 04/30/20  1038     History Chief Complaint  Patient presents with  . Shortness of Breath    Debbie Allen is a 54 y.o. female with a history of aortic regurgitation, PSVT, asthma, and dyslipidemia who presents to the ED due to dyspnea & hypoxia at home, known COVID 19 positive on day 10 of symptoms. Patient states that she has had intermittent fevers to 102.1 at home, chills, fatigue, poor PO intake, & intermittent lightheadedness as if she may pass out. She has been checking her SpO2 at home and it has gotten as low as 80%. It is worse when she moves around. She has been in a wheel chair due to an ankle injury w/ a syncopal episode 04/27/20, found to have an acute displaced medial/lateral malleolar fracture, underwent reduction, and was splinted & discharged home, taking pain medication with some relief in discomfort. She states since last hospital visit her breathing has felt worse and with the hypoxia she returned to the ED. She had some diarrhea a few days ago which resolved, has also had some mild nausea. She denies vomiting, re-occurrence of syncope, body aches, hematemesis, melena, abdominal pain, hemoptysis, recent surgery/trauma, hormone use, personal hx of cancer, or hx of DVT/PE.   HPI     Past Medical History:  Diagnosis Date  . Acid reflux   . Aortic regurgitation    Asymptomatic  . Asthma   . Brain tumor (benign) ([place])   . 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 PSVT (paroxysmal supraventricular tachycardia) ([facility])    Palpitations - suspected diagnosis (LGL postulated by Dr. Debara Pickett but never documented)    Patient Active Problem List   Diagnosis Date Noted  . 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 Chest pain 06/15/2014  . Aortic  regurgitation 04/16/2014  . Palpitations 10/19/2013  . PSVT (paroxysmal supraventricular tachycardia) ([place]) 10/19/2013  . Dyslipidemia 10/19/2013    Past Surgical History:  Procedure Laterality Date  . COLONOSCOPY  07/2019  . DILATION AND CURETTAGE OF UTERUS    . LEFT HEART CATH       OB History    Gravida  2   Para  2   Term  2   Preterm      AB      Living  2     SAB      TAB      Ectopic      Multiple      Live Births              Family History  Problem Relation Age of Onset  . Heart disease Father 59  . Hyperlipidemia Father   . Hyperlipidemia Sister   . Hypertension Paternal Grandmother   . Heart disease Paternal Grandfather   . Heart attack Paternal Grandfather     Social History   Tobacco Use  . Smoking status: Never Smoker  . Smokeless tobacco: Never Used  Vaping Use  . Vaping Use: Never used  Substance Use Topics  . Alcohol use: Yes    Alcohol/week: 0.0 standard drinks    Comment: Occasional  . Drug use: No    Home Medications Prior to Admission medications   Medication Sig Start Date End Date Taking? Authorizing Provider  albuterol (PROVENTIL HFA;VENTOLIN  HFA) 108 (90 BASE) MCG/ACT inhaler Inhale 2 puffs into the lungs every 6 (six) hours as needed for wheezing or shortness of breath.    [provider]  aspirin EC 81 MG tablet Take 1 tablet (81 mg total) by mouth daily. 01/29/17   Dunn, Nedra Hai, PA-C  budesonide-formoterol (SYMBICORT) 160-4.5 MCG/ACT inhaler Inhale 2 puffs into the lungs 2 (two) times daily as needed.    [provider]  esomeprazole (NEXIUM) 20 MG capsule Take 20 mg by mouth daily at 12 noon.    [provider]  HYDROcodone-acetaminophen (NORCO/VICODIN) 5-325 MG tablet Take 1-2 tablets by mouth every 6 (six) hours as needed. 04/27/20   Volanda Napoleon, PA-C  HYDROcodone-acetaminophen (NORCO/VICODIN) 5-325 MG tablet Take 1 tablet by mouth every 4 (four) hours as needed. 04/29/20   Fransico Meadow, PA-C  levocetirizine (XYZAL) 5 MG tablet Take 5 mg by mouth daily. 10/01/13   [provider]  metoprolol tartrate (LOPRESSOR) 25 MG tablet TAKE ONE TABLET BY MOUTH 2 TIMES A DAY. 08/01/19   Arnoldo Lenis, MD  montelukast (SINGULAIR) 10 MG tablet Take 1 tablet by mouth daily. 10/01/13   [provider]  Olopatadine HCl 0.6 % SOLN Place into the nose as needed.    [provider]  ondansetron (ZOFRAN ODT) 4 MG disintegrating tablet Take 1 tablet (4 mg total) by mouth every 8 (eight) hours as needed for nausea or vomiting. 04/27/20   Providence Lanius A, PA-C  potassium chloride SA (KLOR-CON) 20 MEQ tablet TAKE 2 TABLETS BY MOUTH DAILY. 02/26/20   Satira Sark, MD  rosuvastatin (CRESTOR) 20 MG tablet Take 1 tablet (20 mg total) by mouth daily. 12/07/19 03/06/20  Satira Sark, MD  verapamil (VERELAN PM) 240 MG 24 hr capsule TAKE ONE CAPSULE BY MOUTH ONCE DAILY. 08/21/19   Satira Sark, MD    Allergies    Codeine  Review of Systems   Review of Systems  Constitutional: Positive for chills, fatigue and fever.  HENT: Negative for ear pain and sore throat.   Eyes: Negative for visual disturbance.  Respiratory: Positive for cough and shortness of breath.   Cardiovascular: Positive for chest pain.  Gastrointestinal: Positive for diarrhea (x1) and nausea. Negative for abdominal pain, anal bleeding, blood in stool, constipation and vomiting.  Genitourinary: Negative for dysuria.  Musculoskeletal: Positive for arthralgias.  Neurological: Positive for syncope (x1 w/ last prior ED visit, no re-occurrence) and light-headedness. Negative for facial asymmetry, numbness and headaches.  All other systems reviewed and are negative.   Physical Exam Updated Vital Signs BP (!) 81/59 (BP Location: Right Arm)   Pulse (!) 59   Temp 98.3 F (36.8 C) (Oral)   Resp 20   SpO2 93%   Physical Exam Vitals and nursing note reviewed.  Constitutional:       General: She is not in acute distress.    Appearance: She is well-developed. She is not toxic-appearing.  HENT:     Head: Normocephalic and atraumatic.     Mouth/Throat:     Comments: Dry mucous membranes.  Eyes:     General:        Right eye: No discharge.        Left eye: No discharge.     Conjunctiva/sclera: Conjunctivae normal.  Neck:     Comments: No nuchal rigidity.  Cardiovascular:     Rate and Rhythm: Regular rhythm. Bradycardia present.  Pulmonary:     Effort: No respiratory  distress.     Breath sounds: Wheezing (expiratory throughout) and rhonchi (scattered throughout) present.     Comments: SpO2 99% on 5L via [place] placed by triage team, titrated down to room air but does desaturate to 87% on this intermittently, re applied 2L via [place] with improvement to 95%.  Abdominal:     General: There is no distension.     Palpations: Abdomen is soft.     Tenderness: There is no abdominal tenderness.  Musculoskeletal:     Cervical back: Normal range of motion and neck supple.     Comments: LLE in splint, this was removed enough to visualize the lower leg- no pitting edema or significant calf tenderness. RLE without edema or calf tenderness.   Skin:    General: Skin is warm and dry.     Findings: No rash.  Neurological:     Mental Status: She is alert.     Comments: Clear speech.   Psychiatric:        Behavior: Behavior normal.    ED Results / Procedures / Treatments   Labs (all labs ordered are listed, but only abnormal results are displayed) Labs Reviewed  CBC WITH DIFFERENTIAL/PLATELET - Abnormal; Notable for the following components:      Result Value   Neutro Abs 8.9 (*)    Abs Immature Granulocytes 0.19 (*)    All other components within normal limits  COMPREHENSIVE METABOLIC PANEL - Abnormal; Notable for the following components:   Sodium 134 (*)    Glucose, Bld 130 (*)    Calcium 8.4 (*)    Total Protein 6.3 (*)    Albumin 3.0 (*)    AST 45 (*)    Total Bilirubin  0.1 (*)    All other components within normal limits  D-DIMER, QUANTITATIVE (NOT AT [facility]) - Abnormal; Notable for the following components:   D-Dimer, Quant 1.67 (*)    All other components within normal limits  LACTATE DEHYDROGENASE - Abnormal; Notable for the following components:   LDH 487 (*)    All other components within normal limits  FIBRINOGEN - Abnormal; Notable for the following components:   Fibrinogen 685 (*)    All other components within normal limits  TRIGLYCERIDES - Abnormal; Notable for the following components:   Triglycerides 252 (*)    All other components within normal limits  CULTURE, BLOOD (ROUTINE X 2)  CULTURE, BLOOD (ROUTINE X 2)  SARS CORONAVIRUS 2 BY RT PCR (HOSPITAL ORDER, [place])  LACTIC ACID, PLASMA  LACTIC ACID, PLASMA  PROCALCITONIN  FERRITIN  C-REACTIVE PROTEIN  I-STAT BETA HCG BLOOD, ED ([facility], [facility], [facility] ONLY)    EKG None  Radiology DG Chest Port 1 View  Result Date: 04/30/2020 CLINICAL DATA:  Cough and shortness of breath. Recent COVID diagnosis. EXAM: PORTABLE CHEST 1 VIEW COMPARISON:  Chest x-ray dated May 15, 2016. FINDINGS: The heart size and mediastinal contours are within normal limits. Streaky linear and reticulonodular interstitial thickening and opacity at both lung bases. No focal consolidation, pleural effusion, or pneumothorax. No acute osseous abnormality. IMPRESSION: 1. Bilateral lower lobe interstitial and airspace disease, consistent with atypical infection/viral pneumonia. Electronically Signed   By: Titus Dubin M.D.   On: 04/30/2020 12:23    Procedures Procedures (including critical care time)  Medications Ordered in ED Medications  albuterol (VENTOLIN HFA) 108 (90 Base) MCG/ACT inhaler 6 puff (has no administration in time range)  ipratropium (ATROVENT HFA) inhaler 2 puff (has no  administration in time range)  dexamethasone (DECADRON) injection 10 mg (has no administration in time range)   sodium chloride 0.9 % bolus 1,000 mL (has no administration in time range)    ED Course  I have reviewed the triage vital signs and the nursing notes.  Pertinent labs & imaging results that were available during my care of the patient were reviewed by me and considered in my medical decision making (see chart for details).    Shanikqua S Sivils was evaluated in Emergency Department on 04/30/2020 for the symptoms described in the history of present illness. He/she was evaluated in the context of the global COVID-19 pandemic, which necessitated consideration that the patient might be at risk for infection with the SARS-CoV-2 virus that causes COVID-19. Institutional protocols and algorithms that pertain to the evaluation of patients at risk for COVID-19 are in a state of rapid change based on information released by regulatory bodies including the CDC and federal and state organizations. These policies and algorithms were followed during the patient's care in the ED.  MDM Rules/Calculators/A&P                         Patient presents to the ED with complaints of increasing weakness, dyspnea, and hypoxia, known covid 19 positive on day 10 of sxs. Recent LLE injury - in splint. On arrival hypotensive & hypoxic in triage, on my assessment BP 103/69, afebrile, mild bradycardia noted, and SpO2 on RA ranging 87-93%- improved with application of 2L via [place]. She has expiratory wheezing and scattered rhonchi. LLE is in a splint, when visualized lower leg does not appear edematous and she does not have calf tenderness to raise concern for DVT. Plan for albuterol/atrovent, decadron, & 1L fluids. Anticipate admission.   Additional history obtained:  Additional history obtained from chart review & nursing note review. Previous records obtained and reviewed.   EKG: No STEMI.   Lab Tests:  I Ordered, reviewed, and interpreted labs, which included:  CBC: Fairly unremarkable.  CMP: Mild electrolyte abnormalities. Renal  function preserved. Mild elevation in AST.  Lactic acid: WNL Inflammatory markers--> multiple elevations.   Imaging Studies ordered:  I ordered imaging studies which included CXR, I independently visualized and interpreted imaging which showed . Bilateral lower lobe interstitial and airspace disease, consistent with atypical infection/viral pneumonia  Patient continues to require 2L via [place]. Will start remdesivir per pharmacy and discuss with hospitalist service.   Findings and plan of care discussed with supervising physician Dr. Sabra Heck who has evaluated patient & is in agreement.   14:39: CONSULT: Discussed with hospitalist Dr. Doristine Bosworth- accepts admission.   Portions of this note were generated with [employer]. Dictation errors may occur despite best attempts at proofreading.  Final Clinical Impression(s) / ED Diagnoses Final diagnoses:  EFEOF-12  Acute hypoxemic respiratory failure [facility])    Rx / DC Orders ED Discharge Orders    None       Amaryllis Dyke, PA-C 04/30/20 1702    Noemi Chapel, MD 05/01/20 [phone]

## 2020-04-30 NOTE — H&P (Addendum)
History and Physical    Debbie Allen VPX:[id number] DOB: 08-18-1966 DOA: 04/30/2020  PCP: Patient, No Pcp Per  Patient coming from: home  I have personally briefly reviewed patient's old medical records in [place]  Chief Complaint: Tested positive for COVID-19 on 8/16 presented with generalized weakness and worsening cough  HPI: Debbie Allen is a 54 y.o. female with medical history significant of paroxysmal supraventricular tachycardia, acid reflux, hyperlipidemia presents to emergency department with worsening cough and shortness of breath.  Patient tells me that she has symptoms since 8/14 and tested positive for COVID-19 on 8/16 at her PCP office.  Reports cough, congestion, shortness of breath, poor p.o. intake, generalized weakness, body ache, nausea, decreased oxygen saturation at home.  Reports that her oxygen saturation was noted to be low at 80% on room air at home, lightheadedness and dizziness.  Reports that her symptoms got worse this morning therefore she decided to come to the ER for further evaluation and management.  She presented to ER on 04/27/2020 syncopal episode and had left medial and lateral malleolar fracture and underwent reduction and was splinted and discharged home in stable condition.  She denies headache, chest pain, wheezing, leg swelling/redness, fever, chills, vomiting, diarrhea, abdominal pain, urinary or bowel changes, PE/DVT.  She is not vaccinated against COVID-19.  No history of smoking, alcohol, recent drug use.  ED Course: Upon arrival to ED: Patient blood pressure was noted to be 81/59, hypoxic-88% on room air requiring 2 L of oxygen via nasal cannula, afebrile with no leukocytosis.  Initial labs including CBC, CMP, lactic acid: WNL.  Elevated inflammatory markers.  Chest x-ray shows multifocal/bilateral atypical pneumonia.  Patient received Decadron, albuterol, DuoNeb and 500 cc of IV fluid bolus in ED.  [facility] consulted for admission  for acute hypoxemic respiratory failure secondary to COVID-19 pneumonia.  Review of Systems: As per HPI otherwise negative.    Past Medical History:  Diagnosis Date  . Acid reflux   . Aortic regurgitation    Asymptomatic  . Asthma   . Brain tumor (benign) ([place])   . 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 PSVT (paroxysmal supraventricular tachycardia) (H[facility])    Palpitations - suspected diagnosis (LGL postulated by Dr. Debara Pickett but never documented)    Past Surgical History:  Procedure Laterality Date  . COLONOSCOPY  07/2019  . DILATION AND CURETTAGE OF UTERUS    . LEFT HEART CATH       reports that she has never smoked. She has never used smokeless tobacco. She reports current alcohol use. She reports that she does not use drugs.  Allergies  Allergen Reactions  . Codeine     Family History  Problem Relation Age of Onset  . Heart disease Father 64  . Hyperlipidemia Father   . Hyperlipidemia Sister   . Hypertension Paternal Grandmother   . Heart disease Paternal Grandfather   . Heart attack Paternal Grandfather     Prior to Admission medications   Medication Sig Start Date End Date Taking? Authorizing Provider  albuterol (PROVENTIL HFA;VENTOLIN HFA) 108 (90 BASE) MCG/ACT inhaler Inhale 2 puffs into the lungs every 6 (six) hours as needed for wheezing or shortness of breath.    [provider]  aspirin EC 81 MG tablet Take 1 tablet (81 mg total) by mouth daily. 01/29/17   Dunn, Nedra Hai, PA-C  budesonide-formoterol (SYMBICORT) 160-4.5 MCG/ACT inhaler  Inhale 2 puffs into the lungs 2 (two) times daily as needed.    [provider]  esomeprazole (NEXIUM) 20 MG capsule Take 20 mg by mouth daily at 12 noon.    [provider]  HYDROcodone-acetaminophen (NORCO/VICODIN) 5-325 MG tablet Take 1-2 tablets by mouth every 6 (six) hours as needed. 04/27/20   Volanda Napoleon, PA-C  HYDROcodone-acetaminophen (NORCO/VICODIN) 5-325 MG tablet Take 1 tablet by mouth every 4 (four) hours as needed. 04/29/20   Fransico Meadow, PA-C  levocetirizine (XYZAL) 5 MG tablet Take 5 mg by mouth daily. 10/01/13   [provider]  metoprolol tartrate (LOPRESSOR) 25 MG tablet TAKE ONE TABLET BY MOUTH 2 TIMES A DAY. 08/01/19   Arnoldo Lenis, MD  montelukast (SINGULAIR) 10 MG tablet Take 1 tablet by mouth daily. 10/01/13   [provider]  Olopatadine HCl 0.6 % SOLN Place into the nose as needed.    [provider]  ondansetron (ZOFRAN ODT) 4 MG disintegrating tablet Take 1 tablet (4 mg total) by mouth every 8 (eight) hours as needed for nausea or vomiting. 04/27/20   Providence Lanius A, PA-C  potassium chloride SA (KLOR-CON) 20 MEQ tablet TAKE 2 TABLETS BY MOUTH DAILY. 02/26/20   Satira Sark, MD  rosuvastatin (CRESTOR) 20 MG tablet Take 1 tablet (20 mg total) by mouth daily. 12/07/19 03/06/20  Satira Sark, MD  verapamil (VERELAN PM) 240 MG 24 hr capsule TAKE ONE CAPSULE BY MOUTH ONCE DAILY. 08/21/19   Satira Sark, MD    Physical Exam: Vitals:   04/30/20 1200 04/30/20 1230 04/30/20 1300 04/30/20 1330  BP: 101/67 107/69 104/69 95/68  Pulse: 68 69 64 67  Resp:    20  Temp:      TempSrc:      SpO2: 93% 90% 97% 97%    Constitutional: NAD, calm, comfortable, on 2 L of oxygen via nasal cannula, communicating well Eyes: PERRL, lids and conjunctivae normal ENMT: Mucous membranes are moist. Posterior pharynx clear of any exudate or lesions.Normal dentition.  Neck: normal, supple, no masses, no thyromegaly Respiratory: clear to auscultation bilaterally, no wheezing, no crackles. Normal respiratory effort. No accessory muscle use.  Cardiovascular: Regular rate and rhythm, no murmurs / rubs / gallops. No extremity edema. 2+ pedal pulses. No carotid bruits.  Abdomen: no tenderness, no masses palpated. No hepatosplenomegaly. Bowel sounds positive.   Musculoskeletal: Left leg: splint noted.   Skin: no rashes, lesions, ulcers. No induration Neurologic: CN 2-12 grossly intact. Sensation intact, DTR normal. Strength 5/5 in all 4.  Psychiatric: Normal judgment and insight. Alert and oriented x 3. Normal mood.    Labs on Admission: I have personally reviewed following labs and imaging studies  CBC: Recent Labs  Lab 04/30/20 1241  WBC 10.2  NEUTROABS 8.9*  HGB 12.4  HCT 39.6  MCV 89.0  PLT 614   Basic Metabolic Panel: Recent Labs  Lab 04/30/20 1241  NA 134*  K 4.3  CL 100  CO2 24  GLUCOSE 130*  BUN 14  CREATININE 0.86  CALCIUM 8.4*   GFR: Estimated Creatinine Clearance: 74.4 mL/min (by C-G formula based on SCr of 0.86 mg/dL). Liver Function Tests: Recent Labs  Lab 04/30/20 1241  AST 45*  ALT 26  ALKPHOS 80  BILITOT 0.1*  PROT 6.3*  ALBUMIN 3.0*   No results for input(s): LIPASE, AMYLASE in the last 168 hours. No results for input(s): AMMONIA in the last 168 hours. Coagulation Profile: No results  for input(s): INR, PROTIME in the last 168 hours. Cardiac Enzymes: No results for input(s): CKTOTAL, CKMB, CKMBINDEX, TROPONINI in the last 168 hours. BNP (last 3 results) No results for input(s): PROBNP in the last 8760 hours. HbA1C: No results for input(s): HGBA1C in the last 72 hours. CBG: Recent Labs  Lab 04/27/20 1518  GLUCAP 110*   Lipid Profile: Recent Labs    04/30/20 1241  TRIG 252*   Thyroid Function Tests: No results for input(s): TSH, T4TOTAL, FREET4, T3FREE, THYROIDAB in the last 72 hours. Anemia Panel: Recent Labs    04/30/20 1241  FERRITIN 350*   Urine analysis: No results found for: COLORURINE, APPEARANCEUR, LABSPEC, PHURINE, GLUCOSEU, HGBUR, BILIRUBINUR, KETONESUR, PROTEINUR, UROBILINOGEN, NITRITE, LEUKOCYTESUR  Radiological Exams on Admission: DG Chest Port 1 View  Result Date: 04/30/2020 CLINICAL DATA:  Cough and shortness of breath. Recent COVID diagnosis. EXAM: PORTABLE  CHEST 1 VIEW COMPARISON:  Chest x-ray dated May 15, 2016. FINDINGS: The heart size and mediastinal contours are within normal limits. Streaky linear and reticulonodular interstitial thickening and opacity at both lung bases. No focal consolidation, pleural effusion, or pneumothorax. No acute osseous abnormality. IMPRESSION: 1. Bilateral lower lobe interstitial and airspace disease, consistent with atypical infection/viral pneumonia. Electronically Signed   By: Titus Dubin M.D.   On: 04/30/2020 12:23    EKG: Independently reviewed.  Sinus rhythm with short PR interval.  No ST elevation or depression noted.  Assessment/Plan Principal Problem:   Acute hypoxemic respiratory failure due to COVID-19 [facility]) Active Problems:   PSVT (paroxysmal supraventricular tachycardia) (HCC)   Dyslipidemia   Acid reflux   Closed left ankle fracture    Acute hypoxemic respiratory failure due to COVID-19 pneumonia: Patient presented with hypoxia requiring 2 L of oxygen via nasal cannula, tested positive for COVID-19 on 04/22/2020.  Repeat COVID-19 is pending.  Reviewed chest x-ray. -She is afebrile with no leukocytosis, lactic acid: WNL. -Received Decadron/albuterol/DuoNeb in ED. -Admit patient on the floor.  On continuous pulse ox.  We will try to wean off of oxygen as tolerated. -Start patient on remdesivir as per pharmacy and IV Solu-Medrol.  Repeat inflammatory markers tomorrow AM. -D-dimer: 1.67-patient is not tachycardic or tachypneic-we will hold off CT angio of chest for now. -Patient was told that if COVID-19 pneumonitis gets worse we might potentially use Actemra off label, she denies known history of tuberculosis or hepatitis  and wants to proceed with Actemra if required. -Monitor vitals closely.  Hypotension: Patient received 500 cc of IV fluid bolus in ED.  Blood pressure has improved. -Continue to hold metoprolol and verapamil.  Monitor blood pressure closely. -Hold narcotics.  PSVT:  Reviewed EKG.  Stable.  Patient denies any chest pain or palpitation. -We will hold off verapamil and metoprolol as patient's blood pressure is on lower side. -Resume home meds once blood pressure is back to baseline  Left ankle fracture: Reviewed recent x-ray of left ankle. -Lateral and medial malleolar fracture.  Reduced and splinted in ER on 04/27/2020. -Tylenol as needed for pain control. -Orthopedic surgery is following-plan is for internal fixation once patient stabilizes.  Mild intermittent asthma: No wheezing noted on exam. -Continue albuterol, Symbicort, Singulair.  Hyperlipidemia: Continue statin  GERD: Continue PPI  DVT prophylaxis: Lovenox/SCD Code Status: Full code Family Communication: None present at bedside.  Plan of care discussed with patient in length and she verbalized understanding and agreed with it. Disposition Plan: Home in 3 to 4 days Consults called: None Admission status: Inpatient   Michaelene Dutan  Loann Quill MD [facility]  If 7PM-7AM, please contact night-coverage [email or web address] Password [facility]  04/30/2020, 2:45 PM

## 2020-04-30 NOTE — ED Triage Notes (Signed)
Patient diagnosed with Covid this past Monday 8/16. Complains of decreased intake and increased weakness. Arrived with sats in the 80s and BP 80s. Patient alert and oriented, denies pain

## 2020-04-30 NOTE — ED Provider Notes (Signed)
This patient is a pleasant 54 year old female presenting with some increasing dizziness generalized weakness and some ongoing occasional shortness of breath chest pain and coughing.  On exam this patient does have a mild tachypnea, her oxygen ranges between 87% and 93% on room air, she is not hypotensive for me at only about 115 systolic.  She has broken her left ankle with a dislocation after a fall several days ago, she has been positive for Covid for approximately 10 days and is still feeling like she is having fevers at home.  Further work-up and x-ray will be pending, the patient may need admission to the hospital for hypoxia weakness and ongoing more severe symptoms related to Covid.  She is agreeable to this plan  Medical screening examination/treatment/procedure(s) were conducted as a shared visit with non-physician practitioner(s) and myself.  I personally evaluated the patient during the encounter.  Clinical Impression:   Final diagnoses:  COVID-19  Acute hypoxemic respiratory failure ([place])         Noemi Chapel, MD 05/01/20 [id number]

## 2020-04-30 NOTE — Progress Notes (Signed)
Ms. sias is known to me for her ankle injury.  She has an unstable left ankle bimalleolar fracture.  This is been successfully closed and reduced in the emergency department.  Unfortunately, this will ultimately still need internal fixation with surgery.  At this time she is dealing with the obvious Covid issue.  We will follow along while she is in the hospital.  Once she is deemed appropriate for surgical intervention we may decide to proceed with surgery at that time to save her a second admission for the ankle.  We will follow along while she is admitted.

## 2020-04-30 NOTE — ED Notes (Signed)
Pt given water, changed pad under pt, pt comfortable and resting

## 2020-05-01 DIAGNOSIS — S82892S Other fracture of left lower leg, sequela: Secondary | ICD-10-CM

## 2020-05-01 DIAGNOSIS — I471 Supraventricular tachycardia: Secondary | ICD-10-CM

## 2020-05-01 LAB — FERRITIN: Ferritin: 387 ng/mL — ABNORMAL HIGH (ref 11–307)

## 2020-05-01 LAB — CBC WITH DIFFERENTIAL/PLATELET
Abs Immature Granulocytes: 0.3 10*3/uL — ABNORMAL HIGH (ref 0.00–0.07)
Basophils Absolute: 0 10*3/uL (ref 0.0–0.1)
Basophils Relative: 0 %
Eosinophils Absolute: 0 10*3/uL (ref 0.0–0.5)
Eosinophils Relative: 0 %
HCT: 37.4 % (ref 36.0–46.0)
Hemoglobin: 11.9 g/dL — ABNORMAL LOW (ref 12.0–15.0)
Immature Granulocytes: 4 %
Lymphocytes Relative: 9 %
Lymphs Abs: 0.7 10*3/uL (ref 0.7–4.0)
MCH: 27.8 pg (ref 26.0–34.0)
MCHC: 31.8 g/dL (ref 30.0–36.0)
MCV: 87.4 fL (ref 80.0–100.0)
Monocytes Absolute: 0.3 10*3/uL (ref 0.1–1.0)
Monocytes Relative: 4 %
Neutro Abs: 6.6 10*3/uL (ref 1.7–7.7)
Neutrophils Relative %: 83 %
Platelets: 277 10*3/uL (ref 150–400)
RBC: 4.28 MIL/uL (ref 3.87–5.11)
RDW: 13.6 % (ref 11.5–15.5)
WBC: 7.9 10*3/uL (ref 4.0–10.5)
nRBC: 0 % (ref 0.0–0.2)

## 2020-05-01 LAB — COMPREHENSIVE METABOLIC PANEL
ALT: 27 U/L (ref 0–44)
AST: 43 U/L — ABNORMAL HIGH (ref 15–41)
Albumin: 2.9 g/dL — ABNORMAL LOW (ref 3.5–5.0)
Alkaline Phosphatase: 80 U/L (ref 38–126)
Anion gap: 12 (ref 5–15)
BUN: 12 mg/dL (ref 6–20)
CO2: 23 mmol/L (ref 22–32)
Calcium: 8.6 mg/dL — ABNORMAL LOW (ref 8.9–10.3)
Chloride: 104 mmol/L (ref 98–111)
Creatinine, Ser: 0.7 mg/dL (ref 0.44–1.00)
GFR calc Af Amer: >60 mL/min (ref >60)
GFR calc non Af Amer: >60 mL/min (ref >60)
Glucose, Bld: 123 mg/dL — ABNORMAL HIGH (ref 70–99)
Potassium: 4.1 mmol/L (ref 3.5–5.1)
Sodium: 139 mmol/L (ref 135–145)
Total Bilirubin: 0.3 mg/dL (ref 0.3–1.2)
Total Protein: 6.5 g/dL (ref 6.5–8.1)

## 2020-05-01 LAB — MAGNESIUM: Magnesium: 2 mg/dL (ref 1.7–2.4)

## 2020-05-01 LAB — D-DIMER, QUANTITATIVE: D-Dimer, Quant: 1.41 ug/mL-FEU — ABNORMAL HIGH (ref 0.00–0.50)

## 2020-05-01 LAB — PHOSPHORUS: Phosphorus: 3.5 mg/dL (ref 2.5–4.6)

## 2020-05-01 MED ORDER — METHYLPREDNISOLONE SODIUM SUCC 40 MG IJ SOLR
40.0000 mg | Freq: Three times a day (TID) | INTRAMUSCULAR | Status: DC
  Administered 2020-05-02 (×3): 40 mg via INTRAVENOUS
  Filled 2020-05-01 (×3): qty 1

## 2020-05-01 MED ORDER — BARICITINIB 2 MG PO TABS
4.0000 mg | ORAL_TABLET | Freq: Every day | ORAL | Status: DC
  Administered 2020-05-01 – 2020-05-12 (×11): 4 mg via ORAL
  Filled 2020-05-01 (×11): qty 2

## 2020-05-01 MED ORDER — METOPROLOL TARTRATE 25 MG PO TABS
25.0000 mg | ORAL_TABLET | Freq: Two times a day (BID) | ORAL | Status: DC
  Administered 2020-05-01 – 2020-05-02 (×3): 25 mg via ORAL
  Filled 2020-05-01 (×3): qty 1

## 2020-05-01 MED ORDER — VERAPAMIL HCL ER 120 MG PO TBCR
120.0000 mg | EXTENDED_RELEASE_TABLET | Freq: Every day | ORAL | Status: DC
  Filled 2020-05-01: qty 0.5
  Filled 2020-05-01: qty 1

## 2020-05-01 MED ORDER — METHYLPREDNISOLONE SODIUM SUCC 40 MG IJ SOLR: 40.0000 mg | Freq: Three times a day (TID) | INTRAMUSCULAR | Status: DC

## 2020-05-01 MED ORDER — VERAPAMIL HCL ER 120 MG PO TBCR
120.0000 mg | EXTENDED_RELEASE_TABLET | Freq: Every day | ORAL | Status: DC
  Administered 2020-05-02: 120 mg via ORAL
  Filled 2020-05-01: qty 1

## 2020-05-01 NOTE — ED Notes (Signed)
Ordered breakfast 

## 2020-05-01 NOTE — ED Notes (Signed)
Report called to [facility], RN. PT to [phone] via stretcher with RN and tech.

## 2020-05-01 NOTE — ED Notes (Signed)
Patients husband updated via telephone

## 2020-05-01 NOTE — ED Notes (Signed)
Patient instructed on proper use of incentive spirometer and flutter valve by RT. Patient demonstrated proper use of devices.

## 2020-05-01 NOTE — ED Notes (Addendum)
Pt O2 dropping into low 80s, increased 02 to 6L [place] without improvement, pt stated she felt like "I was panicking a bit, feeling like I can't breathe". pt switched to non-rebreather at 15L/min, O2 at 95%, pt states she feels better, dyspnea decreased, will continue to monitor. MD made aware

## 2020-05-01 NOTE — Progress Notes (Addendum)
PROGRESS NOTE                                                                                                                                                                                                             Patient Demographics:    Debbie Allen, is a 54 y.o. female, DOB - 04/19/1966, PJK:[id number]  Admit date - 04/30/2020   Admitting Physician Mckinley Jewel, MD  Outpatient Primary MD for the patient is Patient, No Pcp Per  LOS - 1   Chief Complaint  Patient presents with  . Shortness of Breath       Brief Narrative    Debbie Allen is a 54 y.o. female with medical history significant of paroxysmal supraventricular tachycardia, acid reflux, hyperlipidemia presents to emergency department with worsening cough and shortness of breath.  Patient tells me that she has symptoms since 8/14 and tested positive for COVID-19 on 8/16 at her PCP office.  Reports cough, congestion, shortness of breath, poor p.o. intake, generalized weakness, body ache, nausea, decreased oxygen saturation at home.  Reports that her oxygen saturation was noted to be low at 80% on room air at home, lightheadedness and dizziness.  Reports that her symptoms got worse this morning therefore she decided to come to the ER for further evaluation and management.  She presented to ER on 04/27/2020 syncopal episode and had left medial and lateral malleolar fracture and underwent reduction and was splinted and discharged home in stable condition.  She denies headache, chest pain, wheezing, leg swelling/redness, fever, chills, vomiting, diarrhea, abdominal pain, urinary or bowel changes, PE/DVT.  She is not vaccinated against COVID-19.   Subjective:    Nelda Vassel today she reports Dyspnea, cough, denies any chest pain.   Assessment  & Plan :    Principal Problem:   Acute hypoxemic respiratory failure due to COVID-19 [facility]) Active Problems:   PSVT (paroxysmal supraventricular  tachycardia) (HCC)   Dyslipidemia   Acid reflux   Closed left ankle fracture  Acute hypoxemic respiratory failure due to COVID-19 pneumonia:  -No significant hypoxia, she is currently on 15 L high flow nasal cannula . -Continue with IV steroids . -Continue with IV remdesivir . -Have discussed persistently with the patient, no history of TB, viral hepatitis, diverticulitis, explained risks and benefits, she is agreeable, she is started on [place]  up . -Continue to trend inflammatory markers closely.  Hypotension:  - resolved with fluid bolus. -Pressure improved, will resume on metoprolol and half home dose verapamil . -Hold narcotics.  PSVT: Reviewed EKG.  Stable.  Patient denies any chest pain or palpitation. -Back on metoprolol and half home dose verapamil -Resume home meds once blood pressure is back to baseline  Left ankle fracture: Reviewed recent x-ray of left ankle. -Lateral and medial malleolar fracture.  Reduced and splinted in ER on 04/27/2020. -Tylenol as needed for pain control. -Orthopedic surgery is following-plan is for internal fixation once patient stabilizes.  Patient reports she is supposed to be following with Dr. Stann Mainland  Mild intermittent asthma: -Continue albuterol, Symbicort, Singulair.  Hyperlipidemia: Continue statin   COVID-19 Labs  Recent Labs    04/30/20 1241 05/01/20 0621  DDIMER 1.67* 1.41*  FERRITIN 350* 387*  LDH 487*  --   CRP 12.6*  --     Lab Results  Component Value Date   SARSCOV2NAA POSITIVE (A) 04/30/2020   SARSCOV2NAA Not Detected 09/28/2019   [place] Not Detected 09/12/2019   [place] Not Detected 06/21/2019     Code Status : Full  Family Communication  : D/W husband by phone  Disposition Plan  :  Status is: Inpatient  Remains inpatient appropriate because:IV treatments appropriate due to intensity of illness or inability to take PO   Dispo: The patient is from: Home              Anticipated d/c is  to: Home              Anticipated d/c date is: > 3 days              Patient currently is not medically stable to d/c.       Consults  :  none  Procedures  : None  DVT Prophylaxis  : [place] lovenox  Lab Results  Component Value Date   PLT 277 05/01/2020    Antibiotics  :    Anti-infectives (From admission, onward)   Start     Dose/Rate Route Frequency Ordered Stop   05/01/20 1000  remdesivir 100 mg in sodium chloride 0.9 % 100 mL IVPB       "Followed by" Linked Group Details   100 mg 200 mL/hr over 30 Minutes Intravenous Daily 04/30/20 1444 05/05/20 0959   04/30/20 1600  remdesivir 200 mg in sodium chloride 0.9% 250 mL IVPB       "Followed by" Linked Group Details   200 mg 580 mL/hr over 30 Minutes Intravenous Once 04/30/20 1444 04/30/20 2110        Objective:   Vitals:   05/01/20 1115 05/01/20 1200 05/01/20 1300 05/01/20 1400  BP: 122/85 118/83 126/86 108/78  Pulse: 78 80 81 70  Resp: (!) 21 20 (!) 21 (!) 24  Temp:      TempSrc:      SpO2: 97% 93% 93% 100%  Weight:      Height:        Wt Readings from Last 3 Encounters:  05/01/20 80.7 kg  04/27/20 80.7 kg  12/06/19 82.6 kg     Intake/Output Summary (Last 24 hours) at 05/01/2020 1612 Last data filed at 04/30/2020 2110 Gross per 24 hour  Intake 750 ml  Output --  Net 750 ml     Physical Exam  Awake Alert, Oriented X 3, No new F.N deficits, Normal affect Symmetrical Chest wall movement, Good air movement bilaterally, CTAB  RRR,No Gallops,Rubs or new Murmurs, No Parasternal Heave +ve B.Sounds, Abd Soft, No tenderness,No rebound - guarding or rigidity. No Cyanosis, Clubbing or edema, left lower extremity in a cast    Data Review:    CBC Recent Labs  Lab 04/30/20 1241 05/01/20 0621  WBC 10.2 7.9  HGB 12.4 11.9*  HCT 39.6 37.4  PLT 213 277  MCV 89.0 87.4  MCH 27.9 27.8  MCHC 31.3 31.8  RDW 13.4 13.6  LYMPHSABS 0.7 0.7  MONOABS 0.4 0.3  EOSABS 0.0 0.0  BASOSABS 0.0 0.0    Chemistries   Recent Labs  Lab 04/30/20 1241 05/01/20 0621  NA 134* 139  K 4.3 4.1  CL 100 104  CO2 24 23  GLUCOSE 130* 123*  BUN 14 12  CREATININE 0.86 0.70  CALCIUM 8.4* 8.6*  MG  --  2.0  AST 45* 43*  ALT 26 27  ALKPHOS 80 80  BILITOT 0.1* 0.3   ------------------------------------------------------------------------------------------------------------------ Recent Labs    04/30/20 1241  TRIG 252*    No results found for: HGBA1C ------------------------------------------------------------------------------------------------------------------ No results for input(s): TSH, T4TOTAL, T3FREE, THYROIDAB in the last 72 hours.  Invalid input(s): FREET3 ------------------------------------------------------------------------------------------------------------------ Recent Labs    04/30/20 1241 05/01/20 0621  FERRITIN 350* 387*    Coagulation profile No results for input(s): INR, PROTIME in the last 168 hours.  Recent Labs    04/30/20 1241 05/01/20 0621  DDIMER 1.67* 1.41*    Cardiac Enzymes No results for input(s): CKMB, TROPONINI, MYOGLOBIN in the last 168 hours.  Invalid input(s): CK ------------------------------------------------------------------------------------------------------------------    Component Value Date/Time   BNP 28.7 04/30/2020 1730    Inpatient Medications  Scheduled Meds: . AeroChamber Plus Flo-Vu Medium  1 each Other Once  . aspirin EC  81 mg Oral Daily  . baricitinib  4 mg Oral Daily  . enoxaparin (LOVENOX) injection  40 mg Subcutaneous Q24H  . methylPREDNISolone (SOLU-MEDROL) injection  40 mg Intravenous Q12H  . mometasone-formoterol  2 puff Inhalation BID  . montelukast  10 mg Oral Daily  . pantoprazole  40 mg Oral Daily  . rosuvastatin  20 mg Oral Daily   Continuous Infusions: . remdesivir 100 mg in NS 100 mL Stopped (05/01/20 1027)   PRN Meds:.acetaminophen, albuterol, ondansetron **OR** ondansetron (ZOFRAN) IV  Micro  Results Recent Results (from the past 240 hour(s))  Blood Culture (routine x 2)     Status: None (Preliminary result)   Collection Time: 04/30/20 12:30 PM   Specimen: BLOOD RIGHT ARM  Result Value Ref Range Status   Specimen Description BLOOD RIGHT ARM  Final   Special Requests   Final    BOTTLES DRAWN AEROBIC AND ANAEROBIC Blood Culture adequate volume   Culture   Final    NO GROWTH < 24 HOURS Performed at [facility], [place] [address], [place], [place] [address]    Report Status PENDING  Incomplete  SARS Coronavirus 2 by RT PCR (hospital order, performed in [facility] hospital lab) Nasopharyngeal Nasopharyngeal Swab     Status: Abnormal   Collection Time: 04/30/20 12:40 PM   Specimen: Nasopharyngeal Swab  Result Value Ref Range Status   SARS Coronavirus 2 POSITIVE (A) NEGATIVE Final    Comment: (NOTE) SARS-CoV-2 target nucleic acids are DETECTED  SARS-CoV-2 RNA is generally detectable in upper respiratory specimens  during the acute phase of infection.  Positive results are indicative  of the presence of the identified virus, but do not rule out bacterial infection or co-infection  with other pathogens not detected by the test.  Clinical correlation with patient history and  other diagnostic information is necessary to determine patient infection status.  The expected result is negative.  Fact Sheet for Patients:   StrictlyIdeas.no   Fact Sheet for Healthcare Providers:   BankingDealers.co.za    This test is not yet approved or cleared by the [place] FDA and  has been authorized for detection and/or diagnosis of SARS-CoV-2 by FDA under an Emergency Use Authorization (EUA).  This EUA will remain in effect (meaning this test can be used) for the duration of  the COVID-19 declaration under Section 564(b)(1)  of the Act, 21 U.S.C. section 360-bbb-3(b)(1), unless the authorization is terminated or revoked  sooner.  Performed at [facility], [place] [address] [place], [place] [address]   Blood Culture (routine x 2)     Status: None (Preliminary result)   Collection Time: 04/30/20  2:10 PM   Specimen: BLOOD RIGHT FOREARM  Result Value Ref Range Status   Specimen Description BLOOD RIGHT FOREARM  Final   Special Requests   Final    BOTTLES DRAWN AEROBIC AND ANAEROBIC Blood Culture results may not be optimal due to an inadequate volume of blood received in culture bottles   Culture   Final    NO GROWTH < 24 HOURS Performed at [facility], [place] [address], [place],  [address]    Report Status PENDING  Incomplete    Radiology Reports DG Ankle Complete Left  Result Date: 04/27/2020 CLINICAL DATA:  The patient suffered left medial and lateral malleolar fractures in a fall today. Post reduction imaging. Initial encounter. EXAM: LEFT ANKLE COMPLETE - 3+ VIEW COMPARISON:  Plain films of the left ankle earlier today. FINDINGS: The patient is now in a fiberglass cast. Position and alignment of the patient's medial and lateral malleolar fractures are markedly improved. No new abnormality. IMPRESSION: Marked improvement in position and alignment of medial and lateral malleolar fractures. No new abnormality. Electronically Signed   By: Inge Rise M.D.   On: 04/27/2020 15:24   DG Ankle Complete Left  Result Date: 04/27/2020 CLINICAL DATA:  The patient suffered a left ankle injury in a fall today. Initial encounter. EXAM: LEFT ANKLE COMPLETE - 3+ VIEW COMPARISON:  None. FINDINGS: The patient has acute medial and lateral malleolar fractures. Both fractures are laterally displaced approximately 1 shaft width. The talus is laterally subluxed 1.1 cm. No other acute bony or joint abnormality is identified. There is soft tissue swelling about the patient's fractures. Pins for hallux valgus repair in the distal first metatarsal noted. IMPRESSION: Acute, displaced medial and lateral malleolar  fractures as described. Electronically Signed   By: Inge Rise M.D.   On: 04/27/2020 14:11   DG Chest Port 1 View  Result Date: 04/30/2020 CLINICAL DATA:  Cough and shortness of breath. Recent COVID diagnosis. EXAM: PORTABLE CHEST 1 VIEW COMPARISON:  Chest x-ray dated May 15, 2016. FINDINGS: The heart size and mediastinal contours are within normal limits. Streaky linear and reticulonodular interstitial thickening and opacity at both lung bases. No focal consolidation, pleural effusion, or pneumothorax. No acute osseous abnormality. IMPRESSION: 1. Bilateral lower lobe interstitial and airspace disease, consistent with atypical infection/viral pneumonia. Electronically Signed   By: Titus Dubin M.D.   On: 04/30/2020 12:23      Phillips Climes M.D on 05/01/2020 at [place] PM    [facility] -  Office  [phone]

## 2020-05-02 LAB — COMPREHENSIVE METABOLIC PANEL
ALT: 30 U/L (ref 0–44)
AST: 46 U/L — ABNORMAL HIGH (ref 15–41)
Albumin: 2.8 g/dL — ABNORMAL LOW (ref 3.5–5.0)
Alkaline Phosphatase: 69 U/L (ref 38–126)
Anion gap: 12 (ref 5–15)
BUN: 23 mg/dL — ABNORMAL HIGH (ref 6–20)
CO2: 24 mmol/L (ref 22–32)
Calcium: 8.5 mg/dL — ABNORMAL LOW (ref 8.9–10.3)
Chloride: 103 mmol/L (ref 98–111)
Creatinine, Ser: 0.95 mg/dL (ref 0.44–1.00)
GFR calc Af Amer: >60 mL/min (ref >60)
GFR calc non Af Amer: >60 mL/min (ref >60)
Glucose, Bld: 127 mg/dL — ABNORMAL HIGH (ref 70–99)
Potassium: 4.3 mmol/L (ref 3.5–5.1)
Sodium: 139 mmol/L (ref 135–145)
Total Bilirubin: 0.3 mg/dL (ref 0.3–1.2)
Total Protein: 6.2 g/dL — ABNORMAL LOW (ref 6.5–8.1)

## 2020-05-02 LAB — CBC WITH DIFFERENTIAL/PLATELET
Abs Immature Granulocytes: 0.39 10*3/uL — ABNORMAL HIGH (ref 0.00–0.07)
Basophils Absolute: 0.1 10*3/uL (ref 0.0–0.1)
Basophils Relative: 1 %
Eosinophils Absolute: 0 10*3/uL (ref 0.0–0.5)
Eosinophils Relative: 0 %
HCT: 36.7 % (ref 36.0–46.0)
Hemoglobin: 11.6 g/dL — ABNORMAL LOW (ref 12.0–15.0)
Immature Granulocytes: 4 %
Lymphocytes Relative: 14 %
Lymphs Abs: 1.3 10*3/uL (ref 0.7–4.0)
MCH: 27.8 pg (ref 26.0–34.0)
MCHC: 31.6 g/dL (ref 30.0–36.0)
MCV: 88 fL (ref 80.0–100.0)
Monocytes Absolute: 0.5 10*3/uL (ref 0.1–1.0)
Monocytes Relative: 6 %
Neutro Abs: 7.3 10*3/uL (ref 1.7–7.7)
Neutrophils Relative %: 75 %
Platelets: 311 10*3/uL (ref 150–400)
RBC: 4.17 MIL/uL (ref 3.87–5.11)
RDW: 13.7 % (ref 11.5–15.5)
WBC: 9.6 10*3/uL (ref 4.0–10.5)
nRBC: 0 % (ref 0.0–0.2)

## 2020-05-02 LAB — D-DIMER, QUANTITATIVE: D-Dimer, Quant: 1.56 ug/mL-FEU — ABNORMAL HIGH (ref 0.00–0.50)

## 2020-05-02 LAB — MAGNESIUM: Magnesium: 2.2 mg/dL (ref 1.7–2.4)

## 2020-05-02 LAB — FERRITIN: Ferritin: 406 ng/mL — ABNORMAL HIGH (ref 11–307)

## 2020-05-02 LAB — PHOSPHORUS: Phosphorus: 4.5 mg/dL (ref 2.5–4.6)

## 2020-05-02 MED ORDER — FLEET ENEMA 7-19 GM/118ML RE ENEM: 1.0000 | ENEMA | Freq: Once | RECTAL | Status: DC | PRN

## 2020-05-02 MED ORDER — SENNA 8.6 MG PO TABS
1.0000 | ORAL_TABLET | Freq: Every day | ORAL | Status: DC
  Administered 2020-05-02 – 2020-05-06 (×5): 8.6 mg via ORAL
  Filled 2020-05-02 (×5): qty 1

## 2020-05-02 MED ORDER — POLYETHYLENE GLYCOL 3350 17 G PO PACK: 17.0000 g | PACK | Freq: Every day | ORAL | Status: DC | PRN

## 2020-05-02 MED ORDER — ENSURE ENLIVE PO LIQD
237.0000 mL | Freq: Two times a day (BID) | ORAL | Status: DC
  Administered 2020-05-02 – 2020-05-13 (×15): 237 mL via ORAL

## 2020-05-02 MED ORDER — BISACODYL 5 MG PO TBEC
5.0000 mg | DELAYED_RELEASE_TABLET | Freq: Every day | ORAL | Status: DC | PRN
  Administered 2020-05-02: 5 mg via ORAL
  Filled 2020-05-02: qty 1

## 2020-05-02 NOTE — Progress Notes (Signed)
PROGRESS NOTE                                                                                                                                                                                                             Patient Demographics:    Debbie Allen, is a 54 y.o. female, DOB - 11-Sep-1965, FAO:[id number]  Admit date - 04/30/2020   Admitting Physician Mckinley Jewel, MD  Outpatient Primary MD for the patient is Patient, No Pcp Per  LOS - 2   Chief Complaint  Patient presents with  . Shortness of Breath       Brief Narrative    Debbie Allen is a 54 y.o. female with medical history significant of paroxysmal supraventricular tachycardia, acid reflux, hyperlipidemia presents to emergency department with worsening cough and shortness of breath.  Patient tells me that she has symptoms since 8/14 and tested positive for COVID-19 on 8/16 at her PCP office.  Reports cough, congestion, shortness of breath, poor p.o. intake, generalized weakness, body ache, nausea, decreased oxygen saturation at home.  Reports that her oxygen saturation was noted to be low at 80% on room air at home, lightheadedness and dizziness.  Reports that her symptoms got worse this morning therefore she decided to come to the ER for further evaluation and management.  She presented to ER on 04/27/2020 syncopal episode and had left medial and lateral malleolar fracture and underwent reduction and was splinted and discharged home in stable condition.  She denies headache, chest pain, wheezing, leg swelling/redness, fever, chills, vomiting, diarrhea, abdominal pain, urinary or bowel changes, PE/DVT.  She is not vaccinated against COVID-19.   Subjective:    Sophiagrace Lehenbauer today reports some dyspnea, cough, she denies any chest pain .   Assessment  & Plan :    Principal Problem:   Acute hypoxemic respiratory failure due to COVID-19 [facility]) Active Problems:   PSVT (paroxysmal  supraventricular tachycardia) (HCC)   Dyslipidemia   Acid reflux   Closed left ankle fracture  Acute hypoxemic respiratory failure due to COVID-19 pneumonia:  -He is with significant hypoxia, today she is requiring 10 L high flow nasal cannula. -Continue with IV steroids . -Continue with IV remdesivir . -Continue with Baricitinib. -Have discussed with patient again, have encouraged her to do incentive spirometry, flutter valve, and out of bed to  chair with staff assistant, PT/OT consulted . -Continue to trend inflammatory markers closely.  SpO2: 100 % O2 Flow Rate (L/min): 14 L/min  COVID-19 Labs  Recent Labs    04/30/20 1241 05/01/20 0621 05/02/20 0438  DDIMER 1.67* 1.41* 1.56*  FERRITIN 350* 387* 406*  LDH 487*  --   --   CRP 12.6*  --   --     Lab Results  Component Value Date   SARSCOV2NAA POSITIVE (A) 04/30/2020   [place] Not Detected 09/28/2019   [place] Not Detected 09/12/2019   [place] Not Detected 06/21/2019       Hypotension:  - resolved with fluid bolus. -Blood pressure improved, on metoprolol and half home dose verapamil . -Hold narcotics.  PSVT: Reviewed EKG.  Stable.  Patient denies any chest pain or palpitation. -Back on metoprolol and half home dose verapamil -Resume home meds once blood pressure is back to baseline  Left ankle fracture: Reviewed recent x-ray of left ankle. -Lateral and medial malleolar fracture.  Reduced and splinted in ER on 04/27/2020. -Tylenol as needed for pain control. -Orthopedic surgery is following-plan is for internal fixation once patient stabilizes.  Patient reports she is supposed to be following with Dr. Stann Mainland  Mild intermittent asthma: -Continue albuterol, Symbicort, Singulair.  No active wheezing today, will keep on Combivent scheduled.  Hyperlipidemia: Continue statin   COVID-19 Labs  Recent Labs    04/30/20 1241 05/01/20 0621 05/02/20 0438  DDIMER 1.67* 1.41* 1.56*  FERRITIN 350* 387*  406*  LDH 487*  --   --   CRP 12.6*  --   --     Lab Results  Component Value Date   SARSCOV2NAA POSITIVE (A) 04/30/2020   SARSCOV2NAA Not Detected 09/28/2019   [place] Not Detected 09/12/2019   [place] Not Detected 06/21/2019     Code Status : Full  Family Communication  : D/W husband by phone daily.  Disposition Plan  :  Status is: Inpatient  Remains inpatient appropriate because:IV treatments appropriate due to intensity of illness or inability to take PO   Dispo: The patient is from: Home              Anticipated d/c is to: Home              Anticipated d/c date is: > 3 days              Patient currently is not medically stable to d/c.       Consults  :  none  Procedures  : None  DVT Prophylaxis  : [place] lovenox  Lab Results  Component Value Date   PLT 311 05/02/2020    Antibiotics  :    Anti-infectives (From admission, onward)   Start     Dose/Rate Route Frequency Ordered Stop   05/01/20 1000  remdesivir 100 mg in sodium chloride 0.9 % 100 mL IVPB       "Followed by" Linked Group Details   100 mg 200 mL/hr over 30 Minutes Intravenous Daily 04/30/20 1444 05/05/20 0959   04/30/20 1600  remdesivir 200 mg in sodium chloride 0.9% 250 mL IVPB       "Followed by" Linked Group Details   200 mg 580 mL/hr over 30 Minutes Intravenous Once 04/30/20 1444 04/30/20 2110        Objective:   Vitals:   05/01/20 2100 05/01/20 2200 05/02/20 0155 05/02/20 0810  BP: 114/68 (!) 127/57 130/80 105/69  Pulse: 60 63 64 (!) 50  Resp:  19 16 17 16   Temp:   98.6 F (37 C) 98.3 F (36.8 C)  TempSrc:   Oral   SpO2: 100% 98% 100% 100%  Weight:      Height:        Wt Readings from Last 3 Encounters:  05/01/20 80.7 kg  04/27/20 80.7 kg  12/06/19 82.6 kg     Intake/Output Summary (Last 24 hours) at 05/02/2020 1511 Last data filed at 05/02/2020 0600 Gross per 24 hour  Intake 120 ml  Output 650 ml  Net -530 ml     Physical Exam  Awake Alert, Oriented X  3, No new F.N deficits, Normal affect Symmetrical Chest wall movement, Good air movement bilaterally, CTAB RRR,No Gallops,Rubs or new Murmurs, No Parasternal Heave +ve B.Sounds, Abd Soft, No tenderness, No rebound - guarding or rigidity. No Cyanosis, Clubbing or edema, left lower extremity in a cast     Data Review:    CBC Recent Labs  Lab 04/30/20 1241 05/01/20 0621 05/02/20 0438  WBC 10.2 7.9 9.6  HGB 12.4 11.9* 11.6*  HCT 39.6 37.4 36.7  PLT 213 277 311  MCV 89.0 87.4 88.0  MCH 27.9 27.8 27.8  MCHC 31.3 31.8 31.6  RDW 13.4 13.6 13.7  LYMPHSABS 0.7 0.7 1.3  MONOABS 0.4 0.3 0.5  EOSABS 0.0 0.0 0.0  BASOSABS 0.0 0.0 0.1    Chemistries  Recent Labs  Lab 04/30/20 1241 05/01/20 0621 05/02/20 0438  NA 134* 139 139  K 4.3 4.1 4.3  CL 100 104 103  CO2 24 23 24   GLUCOSE 130* 123* 127*  BUN 14 12 23*  CREATININE 0.86 0.70 0.95  CALCIUM 8.4* 8.6* 8.5*  MG  --  2.0 2.2  AST 45* 43* 46*  ALT 26 27 30   ALKPHOS 80 80 69  BILITOT 0.1* 0.3 0.3   ------------------------------------------------------------------------------------------------------------------ Recent Labs    04/30/20 1241  TRIG 252*    No results found for: HGBA1C ------------------------------------------------------------------------------------------------------------------ No results for input(s): TSH, T4TOTAL, T3FREE, THYROIDAB in the last 72 hours.  Invalid input(s): FREET3 ------------------------------------------------------------------------------------------------------------------ Recent Labs    05/01/20 0621 05/02/20 0438  FERRITIN 387* 406*    Coagulation profile No results for input(s): INR, PROTIME in the last 168 hours.  Recent Labs    05/01/20 0621 05/02/20 0438  DDIMER 1.41* 1.56*    Cardiac Enzymes No results for input(s): CKMB, TROPONINI, MYOGLOBIN in the last 168 hours.  Invalid input(s):  CK ------------------------------------------------------------------------------------------------------------------    Component Value Date/Time   BNP 28.7 04/30/2020 1730    Inpatient Medications  Scheduled Meds: . AeroChamber Plus Flo-Vu Medium  1 each Other Once  . aspirin EC  81 mg Oral Daily  . baricitinib  4 mg Oral Daily  . enoxaparin (LOVENOX) injection  40 mg Subcutaneous Q24H  . feeding supplement (ENSURE ENLIVE)  237 mL Oral BID BM  . methylPREDNISolone (SOLU-MEDROL) injection  40 mg Intravenous Q8H  . metoprolol tartrate  25 mg Oral BID  . mometasone-formoterol  2 puff Inhalation BID  . montelukast  10 mg Oral Daily  . pantoprazole  40 mg Oral Daily  . rosuvastatin  20 mg Oral Daily  . senna  1 tablet Oral Daily  . verapamil  120 mg Oral Daily   Continuous Infusions: . remdesivir 100 mg in NS 100 mL 100 mg (05/02/20 0900)   PRN Meds:.acetaminophen, albuterol, bisacodyl, ondansetron **OR** ondansetron (ZOFRAN) IV, polyethylene glycol, sodium phosphate  Micro Results Recent Results (from the past 240 hour(s))  Blood Culture (routine x 2)     Status: None (Preliminary result)   Collection Time: 04/30/20 12:30 PM   Specimen: BLOOD RIGHT ARM  Result Value Ref Range Status   Specimen Description BLOOD RIGHT ARM  Final   Special Requests   Final    BOTTLES DRAWN AEROBIC AND ANAEROBIC Blood Culture adequate volume   Culture   Final    NO GROWTH 2 DAYS Performed at [facility], [address], [place], [place] [address]    Report Status PENDING  Incomplete  SARS Coronavirus 2 by RT PCR (hospital order, performed in [facility] hospital lab) Nasopharyngeal Nasopharyngeal Swab     Status: Abnormal   Collection Time: 04/30/20 12:40 PM   Specimen: Nasopharyngeal Swab  Result Value Ref Range Status   SARS Coronavirus 2 POSITIVE (A) NEGATIVE Final    Comment: (NOTE) SARS-CoV-2 target nucleic acids are DETECTED  SARS-CoV-2 RNA is generally detectable in upper  respiratory specimens  during the acute phase of infection.  Positive results are indicative  of the presence of the identified virus, but do not rule out bacterial infection or co-infection with other pathogens not detected by the test.  Clinical correlation with patient history and  other diagnostic information is necessary to determine patient infection status.  The expected result is negative.  Fact Sheet for Patients:   StrictlyIdeas.no   Fact Sheet for Healthcare Providers:   BankingDealers.co.za    This test is not yet approved or cleared by the [place] FDA and  has been authorized for detection and/or diagnosis of SARS-CoV-2 by FDA under an Emergency Use Authorization (EUA).  This EUA will remain in effect (meaning this test can be used) for the duration of  the COVID-19 declaration under Section 564(b)(1)  of the Act, 21 U.S.C. section 360-bbb-3(b)(1), unless the authorization is terminated or revoked sooner.  Performed at [facility], [place] [address] [place], [place] [address]   Blood Culture (routine x 2)     Status: None (Preliminary result)   Collection Time: 04/30/20  2:10 PM   Specimen: BLOOD RIGHT FOREARM  Result Value Ref Range Status   Specimen Description BLOOD RIGHT FOREARM  Final   Special Requests   Final    BOTTLES DRAWN AEROBIC AND ANAEROBIC Blood Culture results may not be optimal due to an inadequate volume of blood received in culture bottles   Culture   Final    NO GROWTH 2 DAYS Performed at [facility], [place] [address], [place], [place] [address]    Report Status PENDING  Incomplete    Radiology Reports DG Ankle Complete Left  Result Date: 04/27/2020 CLINICAL DATA:  The patient suffered left medial and lateral malleolar fractures in a fall today. Post reduction imaging. Initial encounter. EXAM: LEFT ANKLE COMPLETE - 3+ VIEW COMPARISON:  Plain films of the left ankle earlier today.  FINDINGS: The patient is now in a fiberglass cast. Position and alignment of the patient's medial and lateral malleolar fractures are markedly improved. No new abnormality. IMPRESSION: Marked improvement in position and alignment of medial and lateral malleolar fractures. No new abnormality. Electronically Signed   By: Inge Rise M.D.   On: 04/27/2020 15:24   DG Ankle Complete Left  Result Date: 04/27/2020 CLINICAL DATA:  The patient suffered a left ankle injury in a fall today. Initial encounter. EXAM: LEFT ANKLE COMPLETE - 3+ VIEW COMPARISON:  None. FINDINGS: The patient has acute medial and lateral malleolar fractures.  Both fractures are laterally displaced approximately 1 shaft width. The talus is laterally subluxed 1.1 cm. No other acute bony or joint abnormality is identified. There is soft tissue swelling about the patient's fractures. Pins for hallux valgus repair in the distal first metatarsal noted. IMPRESSION: Acute, displaced medial and lateral malleolar fractures as described. Electronically Signed   By: Inge Rise M.D.   On: 04/27/2020 14:11   DG Chest Port 1 View  Result Date: 04/30/2020 CLINICAL DATA:  Cough and shortness of breath. Recent COVID diagnosis. EXAM: PORTABLE CHEST 1 VIEW COMPARISON:  Chest x-ray dated May 15, 2016. FINDINGS: The heart size and mediastinal contours are within normal limits. Streaky linear and reticulonodular interstitial thickening and opacity at both lung bases. No focal consolidation, pleural effusion, or pneumothorax. No acute osseous abnormality. IMPRESSION: 1. Bilateral lower lobe interstitial and airspace disease, consistent with atypical infection/viral pneumonia. Electronically Signed   By: Titus Dubin M.D.   On: 04/30/2020 12:23      Phillips Climes M.D on 05/02/2020 at 3:11 PM    [facility] -  Office  [phone]

## 2020-05-02 NOTE — Plan of Care (Signed)

## 2020-05-02 NOTE — Progress Notes (Signed)
Pt has pause of Asystole of 4.02 sec MD Elgergawy, [place] notified.  V/O was given to hold solu- medrol and verapamil until further notice.

## 2020-05-02 NOTE — Progress Notes (Addendum)
Initial Nutrition Assessment  DOCUMENTATION CODES:   Not applicable  INTERVENTION:   Per pt last BM on 8/19, has senokot ordered. Recommend addition of scheduled miralax if no result.   Liberalize diet to REGULAR   Ensure Enlive po BID, each supplement provides 350 kcal and 20 grams of protein  MVI daily   NUTRITION DIAGNOSIS:   Increased nutrient needs related to acute illness as evidenced by estimated needs.  GOAL:   Patient will meet greater than or equal to 90% of their needs  MONITOR:   PO intake, Supplement acceptance, Weight trends, Labs, I & O's  REASON FOR ASSESSMENT:   Malnutrition Screening Tool    ASSESSMENT:   Patient with PMH significant for acid reflux, aortic regurgitation, benign brain tumor, PSVT, and asthma. Presents this admission with COVID-19 PNA.   Found positive on 04/22/20 at doctors office.   Unable to reach pt at this time. Reports poor PO intake over the last week. Meal completions lack documentation. RD to provide supplementation to maximize kcal and protein.   Records show a stated weight of 178 lb this admission. Will need to obtain actual weight to assess for weight loss.   Medications: solumedrol, senokot  Labs: CBG 123-130  Diet Order:   Diet Order            Diet Heart Room service appropriate? Yes; Fluid consistency: Thin  Diet effective now                 EDUCATION NEEDS:   Not appropriate for education at this time  Skin:  Skin Assessment: Reviewed RN Assessment  Last BM:  8/19- per pt  Height:   Ht Readings from Last 1 Encounters:  05/01/20 5\' 2"  (1.575 m)    Weight:   Wt Readings from Last 1 Encounters:  05/01/20 80.7 kg    BMI:  Body mass index is 32.56 kg/m.  Estimated Nutritional Needs:   Kcal:  2100-2300 kcal  Protein:  105-125 grams  Fluid:  >/= 2.1 L/day   Mariana Single RD, LDN Clinical Nutrition Pager listed in [place]

## 2020-05-02 NOTE — Progress Notes (Signed)
Pt arrived to 2w35 from ED via bed. Report received from [employer]. Pt on 15L HFNC, IV x 1 in place. Pt A&O x 4. Pt oriented to unit and caregivers.

## 2020-05-03 LAB — CBC WITH DIFFERENTIAL/PLATELET
Abs Immature Granulocytes: 0.62 10*3/uL — ABNORMAL HIGH (ref 0.00–0.07)
Basophils Absolute: 0.1 10*3/uL (ref 0.0–0.1)
Basophils Relative: 0 %
Eosinophils Absolute: 0 10*3/uL (ref 0.0–0.5)
Eosinophils Relative: 0 %
HCT: 36.1 % (ref 36.0–46.0)
Hemoglobin: 11.4 g/dL — ABNORMAL LOW (ref 12.0–15.0)
Immature Granulocytes: 6 %
Lymphocytes Relative: 10 %
Lymphs Abs: 1.2 10*3/uL (ref 0.7–4.0)
MCH: 27.7 pg (ref 26.0–34.0)
MCHC: 31.6 g/dL (ref 30.0–36.0)
MCV: 87.6 fL (ref 80.0–100.0)
Monocytes Absolute: 0.7 10*3/uL (ref 0.1–1.0)
Monocytes Relative: 6 %
Neutro Abs: 8.8 10*3/uL — ABNORMAL HIGH (ref 1.7–7.7)
Neutrophils Relative %: 78 %
Platelets: 337 10*3/uL (ref 150–400)
RBC: 4.12 MIL/uL (ref 3.87–5.11)
RDW: 13.4 % (ref 11.5–15.5)
WBC: 11.4 10*3/uL — ABNORMAL HIGH (ref 4.0–10.5)
nRBC: 0 % (ref 0.0–0.2)

## 2020-05-03 LAB — D-DIMER, QUANTITATIVE: D-Dimer, Quant: 1.21 ug/mL-FEU — ABNORMAL HIGH (ref 0.00–0.50)

## 2020-05-03 LAB — COMPREHENSIVE METABOLIC PANEL
ALT: 56 U/L — ABNORMAL HIGH (ref 0–44)
AST: 83 U/L — ABNORMAL HIGH (ref 15–41)
Albumin: 3 g/dL — ABNORMAL LOW (ref 3.5–5.0)
Alkaline Phosphatase: 73 U/L (ref 38–126)
Anion gap: 12 (ref 5–15)
BUN: 22 mg/dL — ABNORMAL HIGH (ref 6–20)
CO2: 26 mmol/L (ref 22–32)
Calcium: 8.8 mg/dL — ABNORMAL LOW (ref 8.9–10.3)
Chloride: 103 mmol/L (ref 98–111)
Creatinine, Ser: 0.89 mg/dL (ref 0.44–1.00)
GFR calc Af Amer: >60 mL/min (ref >60)
GFR calc non Af Amer: >60 mL/min (ref >60)
Glucose, Bld: 125 mg/dL — ABNORMAL HIGH (ref 70–99)
Potassium: 4.5 mmol/L (ref 3.5–5.1)
Sodium: 141 mmol/L (ref 135–145)
Total Bilirubin: 0.5 mg/dL (ref 0.3–1.2)
Total Protein: 6.2 g/dL — ABNORMAL LOW (ref 6.5–8.1)

## 2020-05-03 LAB — PHOSPHORUS: Phosphorus: 4.7 mg/dL — ABNORMAL HIGH (ref 2.5–4.6)

## 2020-05-03 LAB — MAGNESIUM: Magnesium: 2.3 mg/dL (ref 1.7–2.4)

## 2020-05-03 LAB — FERRITIN: Ferritin: 432 ng/mL — ABNORMAL HIGH (ref 11–307)

## 2020-05-03 MED ORDER — METOPROLOL TARTRATE 12.5 MG HALF TABLET
12.5000 mg | ORAL_TABLET | Freq: Two times a day (BID) | ORAL | Status: DC
  Administered 2020-05-03 – 2020-05-12 (×15): 12.5 mg via ORAL
  Filled 2020-05-03 (×15): qty 1

## 2020-05-03 NOTE — Progress Notes (Signed)
PROGRESS NOTE                                                                                                                                                                                                             Patient Demographics:    Debbie Allen, is a 54 y.o. female, DOB - July 01, 1966, QPY:[id number]  Admit date - 04/30/2020   Admitting Physician Mckinley Jewel, MD  Outpatient Primary MD for the patient is Patient, No Pcp Per  LOS - 3   Chief Complaint  Patient presents with  . Shortness of Breath       Brief Narrative    Debbie Allen is a 54 y.o. female with medical history significant of paroxysmal supraventricular tachycardia, acid reflux, hyperlipidemia presents to emergency department with worsening cough and shortness of breath.  Patient tells me that she has symptoms since 8/14 and tested positive for COVID-19 on 8/16 at her PCP office.  Reports cough, congestion, shortness of breath, poor p.o. intake, generalized weakness, body ache, nausea, decreased oxygen saturation at home.  Reports that her oxygen saturation was noted to be low at 80% on room air at home, lightheadedness and dizziness.  Reports that her symptoms got worse this morning therefore she decided to come to the ER for further evaluation and management.  She presented to ER on 04/27/2020 syncopal episode and had left medial and lateral malleolar fracture and underwent reduction and was splinted and discharged home in stable condition.  She denies headache, chest pain, wheezing, leg swelling/redness, fever, chills, vomiting, diarrhea, abdominal pain, urinary or bowel changes, PE/DVT.  She is not vaccinated against COVID-19.   Subjective:    Debbie Allen today reports dyspnea, and cough, she had some urinary retention earlier today, but this has resolved , she had 4-second pause yesterday.   Assessment  & Plan :    Principal Problem:   Acute hypoxemic respiratory  failure due to COVID-19 [facility]) Active Problems:   PSVT (paroxysmal supraventricular tachycardia) (HCC)   Dyslipidemia   Acid reflux   Closed left ankle fracture  Acute hypoxemic respiratory failure due to COVID-19 pneumonia:  -He is with significant hypoxia, she remains on 10 L nasal cannula today, . -Continue with IV steroids . -Continue with IV remdesivir . -Continue with Baricitinib. -Have discussed with patient again, have encouraged her to  do incentive spirometry, flutter valve, and out of bed to chair with staff assistant, PT/OT consulted . -Continue to trend inflammatory markers closely.  SpO2: 94 % O2 Flow Rate (L/min): 8 L/min  COVID-19 Labs  Recent Labs    04/30/20 1241 04/30/20 1241 05/01/20 0621 05/02/20 0438 05/03/20 0250  DDIMER 1.67*   < > 1.41* 1.56* 1.21*  FERRITIN 350*   < > 387* 406* 432*  LDH 487*  --   --   --   --   CRP 12.6*  --   --   --   --    < > = values in this interval not displayed.    Lab Results  Component Value Date   [place] (A) 04/30/2020   [place] Not Detected 09/28/2019   [place] Not Detected 09/12/2019   [place] Not Detected 06/21/2019       Hypotension:  - resolved with fluid bolus. -Blood pressure improved, metoprolol and verapamil stopped yesterday given for second pause. -Hold narcotics.  PSVT: Reviewed EKG.  Stable.  Patient denies any chest pain or palpitation. -Hold given soft blood pressure, they were resumed at a lower dose, but he had 4-second pause yesterday, so I have stopped verapamil, and decrease metoprolol to 12.5 mg p.o. twice daily, continue with telemetry monitoring.  Left ankle fracture: Reviewed recent x-ray of left ankle. -Lateral and medial malleolar fracture.  Reduced and splinted in ER on 04/27/2020. -Tylenol as needed for pain control. -Orthopedic surgery is following-plan is for internal fixation once patient stabilizes.  discssed with Dr. Stann Mainland, she will need a internal  fixation in OR, but given her respiratory status this will be on hold for now  till respiratory area results .  Mild intermittent asthma: -Continue albuterol, Symbicort, Singulair.  No active wheezing today, will keep on Combivent scheduled.  Hyperlipidemia: Continue statin   COVID-19 Labs  Recent Labs    04/30/20 1241 04/30/20 1241 05/01/20 0621 05/02/20 0438 05/03/20 0250  DDIMER 1.67*   < > 1.41* 1.56* 1.21*  FERRITIN 350*   < > 387* 406* 432*  LDH 487*  --   --   --   --   CRP 12.6*  --   --   --   --    < > = values in this interval not displayed.    Lab Results  Component Value Date   [place] (A) 04/30/2020   [place] Not Detected 09/28/2019   [place] Not Detected 09/12/2019   [place] Not Detected 06/21/2019     Code Status : Full  Family Communication  : D/W husband by phone daily.  Disposition Plan  :  Status is: Inpatient  Remains inpatient appropriate because:IV treatments appropriate due to intensity of illness or inability to take PO   Dispo: The patient is from: Home              Anticipated d/c is to: Home              Anticipated d/c date is: > 3 days              Patient currently is not medically stable to d/c.     Consults  :  none  Procedures  : None  DVT Prophylaxis  : [place] lovenox  Lab Results  Component Value Date   PLT 337 05/03/2020    Antibiotics  :    Anti-infectives (From admission, onward)   Start     Dose/Rate Route Frequency Ordered Stop  05/01/20 1000  remdesivir 100 mg in sodium chloride 0.9 % 100 mL IVPB       "Followed by" Linked Group Details   100 mg 200 mL/hr over 30 Minutes Intravenous Daily 04/30/20 1444 05/05/20 0959   04/30/20 1600  remdesivir 200 mg in sodium chloride 0.9% 250 mL IVPB       "Followed by" Linked Group Details   200 mg 580 mL/hr over 30 Minutes Intravenous Once 04/30/20 1444 04/30/20 2110        Objective:   Vitals:   05/02/20 0810 05/02/20 1646 05/02/20  2244 05/03/20 0754  BP: 105/69 109/64 112/78 123/68  Pulse: (!) 50 61 61 (!) 50  Resp: 16 18 18 16   Temp: 98.3 F (36.8 C) 99.1 F (37.3 C) 98.2 F (36.8 C)   TempSrc:   Oral   SpO2: 100% (!) 89% 95% 94%  Weight:      Height:        Wt Readings from Last 3 Encounters:  05/01/20 80.7 kg  04/27/20 80.7 kg  12/06/19 82.6 kg     Intake/Output Summary (Last 24 hours) at 05/03/2020 1059 Last data filed at 05/03/2020 0200 Gross per 24 hour  Intake 720 ml  Output 1550 ml  Net -830 ml     Physical Exam  Awake Alert, Oriented X 3, No new F.N deficits, Normal affect Symmetrical Chest wall movement, Good air movement bilaterally, CTAB RRR,No Gallops,Rubs or new Murmurs, No Parasternal Heave +ve B.Sounds, Abd Soft, she has some suprapubic fullness, no rebound - guarding or rigidity. No Cyanosis, Clubbing or edema, left lower extremity in a cast     Data Review:    CBC Recent Labs  Lab 04/30/20 1241 05/01/20 0621 05/02/20 0438 05/03/20 0250  WBC 10.2 7.9 9.6 11.4*  HGB 12.4 11.9* 11.6* 11.4*  HCT 39.6 37.4 36.7 36.1  PLT 213 277 311 337  MCV 89.0 87.4 88.0 87.6  MCH 27.9 27.8 27.8 27.7  MCHC 31.3 31.8 31.6 31.6  RDW 13.4 13.6 13.7 13.4  LYMPHSABS 0.7 0.7 1.3 1.2  MONOABS 0.4 0.3 0.5 0.7  EOSABS 0.0 0.0 0.0 0.0  BASOSABS 0.0 0.0 0.1 0.1    Chemistries  Recent Labs  Lab 04/30/20 1241 05/01/20 0621 05/02/20 0438 05/03/20 0250  NA 134* 139 139 141  K 4.3 4.1 4.3 4.5  CL 100 104 103 103  CO2 24 23 24 26   GLUCOSE 130* 123* 127* 125*  BUN 14 12 23* 22*  CREATININE 0.86 0.70 0.95 0.89  CALCIUM 8.4* 8.6* 8.5* 8.8*  MG  --  2.0 2.2 2.3  AST 45* 43* 46* 83*  ALT 26 27 30  56*  ALKPHOS 80 80 69 73  BILITOT 0.1* 0.3 0.3 0.5   ------------------------------------------------------------------------------------------------------------------ Recent Labs    04/30/20 1241  TRIG 252*    No results found for:  HGBA1C ------------------------------------------------------------------------------------------------------------------ No results for input(s): TSH, T4TOTAL, T3FREE, THYROIDAB in the last 72 hours.  Invalid input(s): FREET3 ------------------------------------------------------------------------------------------------------------------ Recent Labs    05/02/20 0438 05/03/20 0250  FERRITIN 406* 432*    Coagulation profile No results for input(s): INR, PROTIME in the last 168 hours.  Recent Labs    05/02/20 0438 05/03/20 0250  DDIMER 1.56* 1.21*    Cardiac Enzymes No results for input(s): CKMB, TROPONINI, MYOGLOBIN in the last 168 hours.  Invalid input(s): CK ------------------------------------------------------------------------------------------------------------------    Component Value Date/Time   BNP 28.7 04/30/2020 1730    Inpatient Medications  Scheduled Meds: . AeroChamber Plus Flo-Vu  Medium  1 each Other Once  . aspirin EC  81 mg Oral Daily  . baricitinib  4 mg Oral Daily  . enoxaparin (LOVENOX) injection  40 mg Subcutaneous Q24H  . feeding supplement (ENSURE ENLIVE)  237 mL Oral BID BM  . mometasone-formoterol  2 puff Inhalation BID  . montelukast  10 mg Oral Daily  . pantoprazole  40 mg Oral Daily  . rosuvastatin  20 mg Oral Daily  . senna  1 tablet Oral Daily   Continuous Infusions: . remdesivir 100 mg in NS 100 mL 100 mg (05/03/20 1011)   PRN Meds:.acetaminophen, albuterol, bisacodyl, ondansetron **OR** ondansetron (ZOFRAN) IV, polyethylene glycol, sodium phosphate  Micro Results Recent Results (from the past 240 hour(s))  Blood Culture (routine x 2)     Status: None (Preliminary result)   Collection Time: 04/30/20 12:30 PM   Specimen: BLOOD RIGHT ARM  Result Value Ref Range Status   Specimen Description BLOOD RIGHT ARM  Final   Special Requests   Final    BOTTLES DRAWN AEROBIC AND ANAEROBIC Blood Culture adequate volume   Culture   Final     NO GROWTH 3 DAYS Performed at [facility], [place] [address], [place], [place] [address]    Report Status PENDING  Incomplete  SARS Coronavirus 2 by RT PCR (hospital order, performed in [facility] hospital lab) Nasopharyngeal Nasopharyngeal Swab     Status: Abnormal   Collection Time: 04/30/20 12:40 PM   Specimen: Nasopharyngeal Swab  Result Value Ref Range Status   SARS Coronavirus 2 POSITIVE (A) NEGATIVE Final    Comment: (NOTE) SARS-CoV-2 target nucleic acids are DETECTED  SARS-CoV-2 RNA is generally detectable in upper respiratory specimens  during the acute phase of infection.  Positive results are indicative  of the presence of the identified virus, but do not rule out bacterial infection or co-infection with other pathogens not detected by the test.  Clinical correlation with patient history and  other diagnostic information is necessary to determine patient infection status.  The expected result is negative.  Fact Sheet for Patients:   StrictlyIdeas.no   Fact Sheet for Healthcare Providers:   BankingDealers.co.za    This test is not yet approved or cleared by the [place] FDA and  has been authorized for detection and/or diagnosis of SARS-CoV-2 by FDA under an Emergency Use Authorization (EUA).  This EUA will remain in effect (meaning this test can be used) for the duration of  the COVID-19 declaration under Section 564(b)(1)  of the Act, 21 U.S.C. section 360-bbb-3(b)(1), unless the authorization is terminated or revoked sooner.  Performed at [facility], [place] [address] [place], [place] [address]   Blood Culture (routine x 2)     Status: None (Preliminary result)   Collection Time: 04/30/20  2:10 PM   Specimen: BLOOD RIGHT FOREARM  Result Value Ref Range Status   Specimen Description BLOOD RIGHT FOREARM  Final   Special Requests   Final    BOTTLES DRAWN AEROBIC AND ANAEROBIC Blood Culture results may  not be optimal due to an inadequate volume of blood received in culture bottles   Culture   Final    NO GROWTH 3 DAYS Performed at [facility], [place] [address], [place], [place] [address]    Report Status PENDING  Incomplete    Radiology Reports DG Ankle Complete Left  Result Date: 04/27/2020 CLINICAL DATA:  The patient suffered left medial and lateral malleolar fractures in  a fall today. Post reduction imaging. Initial encounter. EXAM: LEFT ANKLE COMPLETE - 3+ VIEW COMPARISON:  Plain films of the left ankle earlier today. FINDINGS: The patient is now in a fiberglass cast. Position and alignment of the patient's medial and lateral malleolar fractures are markedly improved. No new abnormality. IMPRESSION: Marked improvement in position and alignment of medial and lateral malleolar fractures. No new abnormality. Electronically Signed   By: Inge Rise M.D.   On: 04/27/2020 15:24   DG Ankle Complete Left  Result Date: 04/27/2020 CLINICAL DATA:  The patient suffered a left ankle injury in a fall today. Initial encounter. EXAM: LEFT ANKLE COMPLETE - 3+ VIEW COMPARISON:  None. FINDINGS: The patient has acute medial and lateral malleolar fractures. Both fractures are laterally displaced approximately 1 shaft width. The talus is laterally subluxed 1.1 cm. No other acute bony or joint abnormality is identified. There is soft tissue swelling about the patient's fractures. Pins for hallux valgus repair in the distal first metatarsal noted. IMPRESSION: Acute, displaced medial and lateral malleolar fractures as described. Electronically Signed   By: Inge Rise M.D.   On: 04/27/2020 14:11   DG Chest Port 1 View  Result Date: 04/30/2020 CLINICAL DATA:  Cough and shortness of breath. Recent COVID diagnosis. EXAM: PORTABLE CHEST 1 VIEW COMPARISON:  Chest x-ray dated May 15, 2016. FINDINGS: The heart size and mediastinal contours are within normal limits. Streaky linear and reticulonodular  interstitial thickening and opacity at both lung bases. No focal consolidation, pleural effusion, or pneumothorax. No acute osseous abnormality. IMPRESSION: 1. Bilateral lower lobe interstitial and airspace disease, consistent with atypical infection/viral pneumonia. Electronically Signed   By: Titus Dubin M.D.   On: 04/30/2020 12:23      Phillips Climes M.D on 05/03/2020 at [place]  [phone]

## 2020-05-03 NOTE — Plan of Care (Signed)
  Problem: Education: Goal: Knowledge of General Education information will improve Description: Including pain rating scale, medication(s)/side effects and non-pharmacologic comfort measures Outcome: Progressing   Problem: Health Behavior/Discharge Planning: Goal: Ability to manage health-related needs will improve Outcome: Progressing   Problem: Clinical Measurements: Goal: Ability to maintain clinical measurements within normal limits will improve Outcome: Progressing Goal: Will remain free from infection Outcome: Progressing Goal: Diagnostic test results will improve Outcome: Progressing Goal: Cardiovascular complication will be avoided Outcome: Progressing   Problem: Nutrition: Goal: Adequate nutrition will be maintained Outcome: Progressing   Problem: Coping: Goal: Level of anxiety will decrease Outcome: Progressing   Problem: Elimination: Goal: Will not experience complications related to bowel motility Outcome: Progressing Goal: Will not experience complications related to urinary retention Outcome: Progressing   Problem: Pain Managment: Goal: General experience of comfort will improve Outcome: Progressing   Problem: Safety: Goal: Ability to remain free from injury will improve Outcome: Progressing   Problem: Skin Integrity: Goal: Risk for impaired skin integrity will decrease Outcome: Progressing   Problem: Clinical Measurements: Goal: Respiratory complications will improve Outcome: Not Progressing Note: Remains on High flow oxygen   Problem: Activity: Goal: Risk for activity intolerance will decrease Outcome: Not Progressing Note: Continues to drop sats and gets severe dizziness with any movements.

## 2020-05-03 NOTE — Plan of Care (Signed)

## 2020-05-04 LAB — CBC WITH DIFFERENTIAL/PLATELET
Abs Immature Granulocytes: 0.4 10*3/uL — ABNORMAL HIGH (ref 0.00–0.07)
Basophils Absolute: 0 10*3/uL (ref 0.0–0.1)
Basophils Relative: 0 %
Eosinophils Absolute: 0 10*3/uL (ref 0.0–0.5)
Eosinophils Relative: 0 %
HCT: 36 % (ref 36.0–46.0)
Hemoglobin: 11.5 g/dL — ABNORMAL LOW (ref 12.0–15.0)
Immature Granulocytes: 3 %
Lymphocytes Relative: 12 %
Lymphs Abs: 1.7 10*3/uL (ref 0.7–4.0)
MCH: 27.2 pg (ref 26.0–34.0)
MCHC: 31.9 g/dL (ref 30.0–36.0)
MCV: 85.1 fL (ref 80.0–100.0)
Monocytes Absolute: 1.1 10*3/uL — ABNORMAL HIGH (ref 0.1–1.0)
Monocytes Relative: 8 %
Neutro Abs: 10.9 10*3/uL — ABNORMAL HIGH (ref 1.7–7.7)
Neutrophils Relative %: 77 %
Platelets: 329 10*3/uL (ref 150–400)
RBC: 4.23 MIL/uL (ref 3.87–5.11)
RDW: 13.3 % (ref 11.5–15.5)
WBC: 14.1 10*3/uL — ABNORMAL HIGH (ref 4.0–10.5)
nRBC: 0 % (ref 0.0–0.2)

## 2020-05-04 LAB — COMPREHENSIVE METABOLIC PANEL
ALT: 88 U/L — ABNORMAL HIGH (ref 0–44)
AST: 87 U/L — ABNORMAL HIGH (ref 15–41)
Albumin: 2.9 g/dL — ABNORMAL LOW (ref 3.5–5.0)
Alkaline Phosphatase: 74 U/L (ref 38–126)
Anion gap: 10 (ref 5–15)
BUN: 24 mg/dL — ABNORMAL HIGH (ref 6–20)
CO2: 26 mmol/L (ref 22–32)
Calcium: 8.4 mg/dL — ABNORMAL LOW (ref 8.9–10.3)
Chloride: 101 mmol/L (ref 98–111)
Creatinine, Ser: 0.85 mg/dL (ref 0.44–1.00)
GFR calc Af Amer: >60 mL/min (ref >60)
GFR calc non Af Amer: >60 mL/min (ref >60)
Glucose, Bld: 91 mg/dL (ref 70–99)
Potassium: 3.7 mmol/L (ref 3.5–5.1)
Sodium: 137 mmol/L (ref 135–145)
Total Bilirubin: 0.5 mg/dL (ref 0.3–1.2)
Total Protein: 6 g/dL — ABNORMAL LOW (ref 6.5–8.1)

## 2020-05-04 LAB — MAGNESIUM: Magnesium: 2 mg/dL (ref 1.7–2.4)

## 2020-05-04 LAB — PHOSPHORUS: Phosphorus: 4.3 mg/dL (ref 2.5–4.6)

## 2020-05-04 LAB — D-DIMER, QUANTITATIVE: D-Dimer, Quant: 1.54 ug/mL-FEU — ABNORMAL HIGH (ref 0.00–0.50)

## 2020-05-04 LAB — FERRITIN: Ferritin: 293 ng/mL (ref 11–307)

## 2020-05-04 MED ORDER — GUAIFENESIN-DM 100-10 MG/5ML PO SYRP
10.0000 mL | ORAL_SOLUTION | ORAL | Status: DC | PRN
  Administered 2020-05-04 – 2020-05-10 (×16): 10 mL via ORAL
  Filled 2020-05-04 (×17): qty 10

## 2020-05-04 NOTE — Progress Notes (Signed)
PROGRESS NOTE                                                                                                                                                                                                             Patient Demographics:    Debbie Allen, is a 54 y.o. female, DOB - 1965/12/13, AST:[id number]  Admit date - 04/30/2020   Admitting Physician Mckinley Jewel, MD  Outpatient Primary MD for the patient is Patient, No Pcp Per  LOS - 4   Chief Complaint  Patient presents with  . Shortness of Breath       Brief Narrative    Debbie Allen is a 54 y.o. female with medical history significant of paroxysmal supraventricular tachycardia, acid reflux, hyperlipidemia presents to emergency department with worsening cough and shortness of breath.  Patient tells me that she has symptoms since 8/14 and tested positive for COVID-19 on 8/16 at her PCP office.  Reports cough, congestion, shortness of breath, poor p.o. intake, generalized weakness, body ache, nausea, decreased oxygen saturation at home.  Reports that her oxygen saturation was noted to be low at 80% on room air at home, lightheadedness and dizziness.  Reports that her symptoms got worse this morning therefore she decided to come to the ER for further evaluation and management.  She presented to ER on 04/27/2020 syncopal episode and had left medial and lateral malleolar fracture and underwent reduction and was splinted and discharged home in stable condition.  She denies headache, chest pain, wheezing, leg swelling/redness, fever, chills, vomiting, diarrhea, abdominal pain, urinary or bowel changes, PE/DVT.  She is not vaccinated against COVID-19.   Subjective:    Debbie Allen today reports dyspnea, and cough, no further urinary retention .  Reports she was lightheaded when she sat at the edge of the bed yesterday.   Assessment  & Plan :    Principal Problem:   Acute hypoxemic respiratory  failure due to COVID-19 [facility]) Active Problems:   PSVT (paroxysmal supraventricular tachycardia) (HCC)   Dyslipidemia   Acid reflux   Closed left ankle fracture  Acute hypoxemic respiratory failure due to COVID-19 pneumonia:  -She remains with significant hypoxia, she is on 8 to 10 L high flow nasal cannula . -Continue with IV steroids . -Continue with IV remdesivir . -Continue with Baricitinib. -Have discussed with  patient again, have encouraged her to do incentive spirometry, flutter valve, and out of bed to chair with staff assistant, PT/OT consulted . -Continue to trend inflammatory markers closely.  SpO2: 99 % O2 Flow Rate (L/min): 8 L/min  COVID-19 Labs  Recent Labs    05/02/20 0438 05/03/20 0250 05/04/20 0109  DDIMER 1.56* 1.21* 1.54*  FERRITIN 406* 432* 293    Lab Results  Component Value Date   SARSCOV2NAA POSITIVE (A) 04/30/2020   [place] Not Detected 09/28/2019   [place] Not Detected 09/12/2019   [place] Not Detected 06/21/2019       Hypotension:  - resolved with fluid bolus. -Blood pressure improved, metoprolol and verapamil stopped yesterday given 4 second pause. -Hold narcotics.  PSVT: Reviewed EKG.  Stable.  Patient denies any chest pain or palpitation. -Hold given soft blood pressure, they were resumed at a lower dose, but he had 4-second pause yesterday, so I have stopped verapamil, and decreased metoprolol to 12.5 mg p.o. twice daily, continue with telemetry monitoring.  Left ankle fracture: Reviewed recent x-ray of left ankle. -Lateral and medial malleolar fracture.  Reduced and splinted in ER on 04/27/2020. -Tylenol as needed for pain control. -Orthopedic surgery is following-plan is for internal fixation once patient stabilizes.  discssed with Dr. Stann Mainland, she will need a internal fixation in OR, but given her respiratory status this will be on hold for now  till respiratory status improve.  Mild intermittent asthma: -Continue  albuterol, Symbicort, Singulair.  No active wheezing today, will keep on Combivent scheduled.  Hyperlipidemia: Continue statin   COVID-19 Labs  Recent Labs    05/02/20 0438 05/03/20 0250 05/04/20 0109  DDIMER 1.56* 1.21* 1.54*  FERRITIN 406* 432* 293    Lab Results  Component Value Date   SARSCOV2NAA POSITIVE (A) 04/30/2020   SARSCOV2NAA Not Detected 09/28/2019   [place] Not Detected 09/12/2019   [place] Not Detected 06/21/2019     Code Status : Full  Family Communication  : D/W husband by phone daily.  Disposition Plan  :  Status is: Inpatient  Remains inpatient appropriate because:IV treatments appropriate due to intensity of illness or inability to take PO   Dispo: The patient is from: Home              Anticipated d/c is to: Home              Anticipated d/c date is: > 3 days              Patient currently is not medically stable to d/c.     Consults  :  none  Procedures  : None  DVT Prophylaxis  : Miranda lovenox  Lab Results  Component Value Date   PLT 329 05/04/2020    Antibiotics  :    Anti-infectives (From admission, onward)   Start     Dose/Rate Route Frequency Ordered Stop   05/01/20 1000  remdesivir 100 mg in sodium chloride 0.9 % 100 mL IVPB       "Followed by" Linked Group Details   100 mg 200 mL/hr over 30 Minutes Intravenous Daily 04/30/20 1444 05/04/20 0944   04/30/20 1600  remdesivir 200 mg in sodium chloride 0.9% 250 mL IVPB       "Followed by" Linked Group Details   200 mg 580 mL/hr over 30 Minutes Intravenous Once 04/30/20 1444 04/30/20 2110        Objective:   Vitals:   05/03/20 2000 05/03/20 2015 05/03/20 2030 05/04/20 8003  BP:    106/70  Pulse:    68  Resp: 18 (!) 21 (!) 22 18  Temp:    98.1 F (36.7 C)  TempSrc:      SpO2:    99%  Weight:      Height:        Wt Readings from Last 3 Encounters:  05/01/20 80.7 kg  04/27/20 80.7 kg  12/06/19 82.6 kg    No intake or output data in the 24 hours ending  05/04/20 1143   Physical Exam  Awake Alert, Oriented X 3, No new F.N deficits, Normal affect Symmetrical Chest wall movement, Good air movement bilaterally, CTAB RRR,No Gallops,Rubs or new Murmurs, No Parasternal Heave +ve B.Sounds, Abd Soft, No tenderness, No rebound - guarding or rigidity. No Cyanosis, Clubbing or edema, left lower extremity in a cast     Data Review:    CBC Recent Labs  Lab 04/30/20 1241 05/01/20 0621 05/02/20 0438 05/03/20 0250 05/04/20 0109  WBC 10.2 7.9 9.6 11.4* 14.1*  HGB 12.4 11.9* 11.6* 11.4* 11.5*  HCT 39.6 37.4 36.7 36.1 36.0  PLT 213 277 311 337 329  MCV 89.0 87.4 88.0 87.6 85.1  MCH 27.9 27.8 27.8 27.7 27.2  MCHC 31.3 31.8 31.6 31.6 31.9  RDW 13.4 13.6 13.7 13.4 13.3  LYMPHSABS 0.7 0.7 1.3 1.2 1.7  MONOABS 0.4 0.3 0.5 0.7 1.1*  EOSABS 0.0 0.0 0.0 0.0 0.0  BASOSABS 0.0 0.0 0.1 0.1 0.0    Chemistries  Recent Labs  Lab 04/30/20 1241 05/01/20 0621 05/02/20 0438 05/03/20 0250 05/04/20 0109  NA 134* 139 139 141 137  K 4.3 4.1 4.3 4.5 3.7  CL 100 104 103 103 101  CO2 24 23 24 26 26   GLUCOSE 130* 123* 127* 125* 91  BUN 14 12 23* 22* 24*  CREATININE 0.86 0.70 0.95 0.89 0.85  CALCIUM 8.4* 8.6* 8.5* 8.8* 8.4*  MG  --  2.0 2.2 2.3 2.0  AST 45* 43* 46* 83* 87*  ALT 26 27 30  56* 88*  ALKPHOS 80 80 69 73 74  BILITOT 0.1* 0.3 0.3 0.5 0.5   ------------------------------------------------------------------------------------------------------------------ No results for input(s): CHOL, HDL, LDLCALC, TRIG, CHOLHDL, LDLDIRECT in the last 72 hours.  No results found for: HGBA1C ------------------------------------------------------------------------------------------------------------------ No results for input(s): TSH, T4TOTAL, T3FREE, THYROIDAB in the last 72 hours.  Invalid input(s): FREET3 ------------------------------------------------------------------------------------------------------------------ Recent Labs    05/03/20 0250  05/04/20 0109  FERRITIN 432* 293    Coagulation profile No results for input(s): INR, PROTIME in the last 168 hours.  Recent Labs    05/03/20 0250 05/04/20 0109  DDIMER 1.21* 1.54*    Cardiac Enzymes No results for input(s): CKMB, TROPONINI, MYOGLOBIN in the last 168 hours.  Invalid input(s): CK ------------------------------------------------------------------------------------------------------------------    Component Value Date/Time   BNP 28.7 04/30/2020 1730    Inpatient Medications  Scheduled Meds: . AeroChamber Plus Flo-Vu Medium  1 each Other Once  . aspirin EC  81 mg Oral Daily  . baricitinib  4 mg Oral Daily  . enoxaparin (LOVENOX) injection  40 mg Subcutaneous Q24H  . feeding supplement (ENSURE ENLIVE)  237 mL Oral BID BM  . metoprolol tartrate  12.5 mg Oral BID  . mometasone-formoterol  2 puff Inhalation BID  . montelukast  10 mg Oral Daily  . pantoprazole  40 mg Oral Daily  . rosuvastatin  20 mg Oral Daily  . senna  1 tablet Oral Daily   Continuous Infusions:  PRN Meds:.acetaminophen, albuterol,  bisacodyl, guaiFENesin-dextromethorphan, ondansetron **OR** ondansetron (ZOFRAN) IV, polyethylene glycol, sodium phosphate  Micro Results Recent Results (from the past 240 hour(s))  Blood Culture (routine x 2)     Status: None (Preliminary result)   Collection Time: 04/30/20 12:30 PM   Specimen: BLOOD RIGHT ARM  Result Value Ref Range Status   Specimen Description BLOOD RIGHT ARM  Final   Special Requests   Final    BOTTLES DRAWN AEROBIC AND ANAEROBIC Blood Culture adequate volume   Culture   Final    NO GROWTH 4 DAYS Performed at [facility], [place] [address], [place], [place] [address]    Report Status PENDING  Incomplete  SARS Coronavirus 2 by RT PCR (hospital order, performed in [facility] hospital lab) Nasopharyngeal Nasopharyngeal Swab     Status: Abnormal   Collection Time: 04/30/20 12:40 PM   Specimen: Nasopharyngeal Swab  Result Value  Ref Range Status   SARS Coronavirus 2 POSITIVE (A) NEGATIVE Final    Comment: (NOTE) SARS-CoV-2 target nucleic acids are DETECTED  SARS-CoV-2 RNA is generally detectable in upper respiratory specimens  during the acute phase of infection.  Positive results are indicative  of the presence of the identified virus, but do not rule out bacterial infection or co-infection with other pathogens not detected by the test.  Clinical correlation with patient history and  other diagnostic information is necessary to determine patient infection status.  The expected result is negative.  Fact Sheet for Patients:   StrictlyIdeas.no   Fact Sheet for Healthcare Providers:   BankingDealers.co.za    This test is not yet approved or cleared by the [place] FDA and  has been authorized for detection and/or diagnosis of SARS-CoV-2 by FDA under an Emergency Use Authorization (EUA).  This EUA will remain in effect (meaning this test can be used) for the duration of  the COVID-19 declaration under Section 564(b)(1)  of the Act, 21 U.S.C. section 360-bbb-3(b)(1), unless the authorization is terminated or revoked sooner.  Performed at [facility], [place] [address], [place], [place] [address]   Blood Culture (routine x 2)     Status: None (Preliminary result)   Collection Time: 04/30/20  2:10 PM   Specimen: BLOOD RIGHT FOREARM  Result Value Ref Range Status   Specimen Description BLOOD RIGHT FOREARM  Final   Special Requests   Final    BOTTLES DRAWN AEROBIC AND ANAEROBIC Blood Culture results may not be optimal due to an inadequate volume of blood received in culture bottles   Culture   Final    NO GROWTH 4 DAYS Performed at [facility], [place] [address], [place], [place] [address]    Report Status PENDING  Incomplete    Radiology Reports DG Ankle Complete Left  Result Date: 04/27/2020 CLINICAL DATA:  The patient suffered left medial and  lateral malleolar fractures in a fall today. Post reduction imaging. Initial encounter. EXAM: LEFT ANKLE COMPLETE - 3+ VIEW COMPARISON:  Plain films of the left ankle earlier today. FINDINGS: The patient is now in a fiberglass cast. Position and alignment of the patient's medial and lateral malleolar fractures are markedly improved. No new abnormality. IMPRESSION: Marked improvement in position and alignment of medial and lateral malleolar fractures. No new abnormality. Electronically Signed   By: Inge Rise M.D.   On: 04/27/2020 15:24   DG Ankle Complete Left  Result Date: 04/27/2020 CLINICAL DATA:  The patient suffered a left ankle injury in a fall today.  Initial encounter. EXAM: LEFT ANKLE COMPLETE - 3+ VIEW COMPARISON:  None. FINDINGS: The patient has acute medial and lateral malleolar fractures. Both fractures are laterally displaced approximately 1 shaft width. The talus is laterally subluxed 1.1 cm. No other acute bony or joint abnormality is identified. There is soft tissue swelling about the patient's fractures. Pins for hallux valgus repair in the distal first metatarsal noted. IMPRESSION: Acute, displaced medial and lateral malleolar fractures as described. Electronically Signed   By: Inge Rise M.D.   On: 04/27/2020 14:11   DG Chest Port 1 View  Result Date: 04/30/2020 CLINICAL DATA:  Cough and shortness of breath. Recent COVID diagnosis. EXAM: PORTABLE CHEST 1 VIEW COMPARISON:  Chest x-ray dated May 15, 2016. FINDINGS: The heart size and mediastinal contours are within normal limits. Streaky linear and reticulonodular interstitial thickening and opacity at both lung bases. No focal consolidation, pleural effusion, or pneumothorax. No acute osseous abnormality. IMPRESSION: 1. Bilateral lower lobe interstitial and airspace disease, consistent with atypical infection/viral pneumonia. Electronically Signed   By: Titus Dubin M.D.   On: 04/30/2020 12:23      Phillips Climes  M.D on 05/04/2020 at 11:43 AM    [facility] -  Office  [phone]

## 2020-05-05 LAB — PHOSPHORUS: Phosphorus: 3.1 mg/dL (ref 2.5–4.6)

## 2020-05-05 LAB — CULTURE, BLOOD (ROUTINE X 2)
Culture: NO GROWTH
Culture: NO GROWTH
Special Requests: ADEQUATE

## 2020-05-05 LAB — CBC WITH DIFFERENTIAL/PLATELET
Abs Immature Granulocytes: 0.44 10*3/uL — ABNORMAL HIGH (ref 0.00–0.07)
Basophils Absolute: 0 10*3/uL (ref 0.0–0.1)
Basophils Relative: 0 %
Eosinophils Absolute: 0.1 10*3/uL (ref 0.0–0.5)
Eosinophils Relative: 0 %
HCT: 34.5 % — ABNORMAL LOW (ref 36.0–46.0)
Hemoglobin: 11.1 g/dL — ABNORMAL LOW (ref 12.0–15.0)
Immature Granulocytes: 3 %
Lymphocytes Relative: 7 %
Lymphs Abs: 1 10*3/uL (ref 0.7–4.0)
MCH: 27.3 pg (ref 26.0–34.0)
MCHC: 32.2 g/dL (ref 30.0–36.0)
MCV: 85 fL (ref 80.0–100.0)
Monocytes Absolute: 0.8 10*3/uL (ref 0.1–1.0)
Monocytes Relative: 6 %
Neutro Abs: 11.4 10*3/uL — ABNORMAL HIGH (ref 1.7–7.7)
Neutrophils Relative %: 84 %
Platelets: 349 10*3/uL (ref 150–400)
RBC: 4.06 MIL/uL (ref 3.87–5.11)
RDW: 13.2 % (ref 11.5–15.5)
WBC: 13.7 10*3/uL — ABNORMAL HIGH (ref 4.0–10.5)
nRBC: 0 % (ref 0.0–0.2)

## 2020-05-05 LAB — COMPREHENSIVE METABOLIC PANEL
ALT: 59 U/L — ABNORMAL HIGH (ref 0–44)
AST: 38 U/L (ref 15–41)
Albumin: 2.6 g/dL — ABNORMAL LOW (ref 3.5–5.0)
Alkaline Phosphatase: 83 U/L (ref 38–126)
Anion gap: 11 (ref 5–15)
BUN: 14 mg/dL (ref 6–20)
CO2: 26 mmol/L (ref 22–32)
Calcium: 8.3 mg/dL — ABNORMAL LOW (ref 8.9–10.3)
Chloride: 99 mmol/L (ref 98–111)
Creatinine, Ser: 0.6 mg/dL (ref 0.44–1.00)
GFR calc Af Amer: >60 mL/min (ref >60)
GFR calc non Af Amer: >60 mL/min (ref >60)
Glucose, Bld: 117 mg/dL — ABNORMAL HIGH (ref 70–99)
Potassium: 3.4 mmol/L — ABNORMAL LOW (ref 3.5–5.1)
Sodium: 136 mmol/L (ref 135–145)
Total Bilirubin: 0.5 mg/dL (ref 0.3–1.2)
Total Protein: 5.8 g/dL — ABNORMAL LOW (ref 6.5–8.1)

## 2020-05-05 LAB — D-DIMER, QUANTITATIVE: D-Dimer, Quant: 0.93 ug/mL-FEU — ABNORMAL HIGH (ref 0.00–0.50)

## 2020-05-05 LAB — MAGNESIUM: Magnesium: 2.1 mg/dL (ref 1.7–2.4)

## 2020-05-05 LAB — FERRITIN: Ferritin: 249 ng/mL (ref 11–307)

## 2020-05-05 MED ORDER — ALUM & MAG HYDROXIDE-SIMETH 200-200-20 MG/5ML PO SUSP
15.0000 mL | ORAL | Status: DC | PRN
  Administered 2020-05-05: 15 mL via ORAL
  Filled 2020-05-05: qty 30

## 2020-05-05 MED ORDER — POTASSIUM CHLORIDE CRYS ER 20 MEQ PO TBCR
40.0000 meq | EXTENDED_RELEASE_TABLET | Freq: Once | ORAL | Status: AC
  Administered 2020-05-05: 40 meq via ORAL
  Filled 2020-05-05: qty 2

## 2020-05-05 MED ORDER — MIDODRINE HCL 5 MG PO TABS
2.5000 mg | ORAL_TABLET | Freq: Two times a day (BID) | ORAL | Status: DC
  Administered 2020-05-05 – 2020-05-12 (×14): 2.5 mg via ORAL
  Filled 2020-05-05 (×14): qty 1

## 2020-05-05 NOTE — Progress Notes (Addendum)
PROGRESS NOTE                                                                                                                                                                                                             Patient Demographics:    Debbie Allen, is a 54 y.o. female, DOB - [birth year], RJJ:[id number]  Admit date - 04/30/2020   Admitting Physician Mckinley Jewel, MD  Outpatient Primary MD for the patient is Patient, No Pcp Per  LOS - 5   Chief Complaint  Patient presents with  . Shortness of Breath       Brief Narrative    Debbie Allen is a 54 y.o. female with medical history significant of paroxysmal supraventricular tachycardia, acid reflux, hyperlipidemia presents to emergency department with worsening cough and shortness of breath.  Patient tells me that she has symptoms since 8/14 and tested positive for COVID-19 on 8/16 at her PCP office.  Reports cough, congestion, shortness of breath, poor p.o. intake, generalized weakness, body ache, nausea, decreased oxygen saturation at home.  Reports that her oxygen saturation was noted to be low at 80% on room air at home, lightheadedness and dizziness.  Reports that her symptoms got worse this morning therefore she decided to come to the ER for further evaluation and management.  She presented to ER on 04/27/2020 syncopal episode and had left medial and lateral malleolar fracture and underwent reduction and was splinted and discharged home in stable condition.  She denies headache, chest pain, wheezing, leg swelling/redness, fever, chills, vomiting, diarrhea, abdominal pain, urinary or bowel changes, PE/DVT.  She is not vaccinated against COVID-19.   Subjective:    [name] today ports dyspnea, lightheadedness with activity, and cough, no retention or constipation.    Assessment  & Plan :    Principal Problem:   Acute hypoxemic respiratory failure due to COVID-19 [facility]) Active Problems:    PSVT (paroxysmal supraventricular tachycardia) (HCC)   Dyslipidemia   Acid reflux   Closed left ankle fracture  Acute hypoxemic respiratory failure due to COVID-19 pneumonia:  -She remains with significant hypoxia, hypoxia has improved, she was on 6 L high flow nasal cannula when I saw . -Continue with IV steroids . -Treated with IV remdesivir . -Continue with Baricitinib. -Have discussed with patient again, have encouraged her to do incentive  spirometry, flutter valve, and out of bed to chair with staff assistant, PT/OT consulted . -Continue to trend inflammatory markers closely.  SpO2: 96 % O2 Flow Rate (L/min): 9 L/min  COVID-19 Labs  Recent Labs    05/03/20 0250 05/04/20 0109 05/05/20 0301  DDIMER 1.21* 1.54* 0.93*  FERRITIN 432* 293 249    Lab Results  Component Value Date   SARSCOV2NAA POSITIVE (A) 04/30/2020   SARSCOV2NAA Not Detected 09/28/2019   SARSCOV2NAA Not Detected 09/12/2019   [place] Not Detected 06/21/2019       Hypotension:  - resolved with fluid bolus. -Blood pressure improved, metoprolol and verapamil stopped given some mild pauses/ -Pressure remains soft, but no hypotension, she reports some dizziness with activity, so I will start on low-dose midodrine. -Hold narcotics.  Hypokalemia -Potassium is 3.4, repleted, recheck in a.m.  PSVT: Reviewed EKG.  Stable.  Patient denies any chest pain or palpitation. -Hold given soft blood pressure, they were resumed at a lower dose, but he had 4-second pause yesterday, so I have stopped verapamil, and decreased metoprolol to 12.5 mg p.o. twice daily, continue with telemetry monitoring.  Left ankle fracture: Reviewed recent x-ray of left ankle. -Lateral and medial malleolar fracture.  Reduced and splinted in ER on 04/27/2020. -Tylenol as needed for pain control. -Orthopedic surgery is following-plan is for internal fixation once patient stabilizes.  discssed with Dr. Stann Mainland, she will need a internal  fixation in OR, but given her respiratory status this will be on hold for now  till respiratory status improve.  Transaminitis -Due to Covid, stable  Mild intermittent asthma: -Continue albuterol, Symbicort, Singulair.  No active wheezing today, will keep on Combivent scheduled.  Hyperlipidemia: Continue statin   COVID-19 Labs  Recent Labs    05/03/20 0250 05/04/20 0109 05/05/20 0301  DDIMER 1.21* 1.54* 0.93*  FERRITIN 432* 293 249    Lab Results  Component Value Date   SARSCOV2NAA POSITIVE (A) 04/30/2020   [place] Not Detected 09/28/2019   [place] Not Detected 09/12/2019   [place] Not Detected 06/21/2019     Code Status : Full  Family Communication  : D/W husband by phone daily.  Disposition Plan  :  Status is: Inpatient  Remains inpatient appropriate because:IV treatments appropriate due to intensity of illness or inability to take PO   Dispo: The patient is from: Home              Anticipated d/c is to: Home              Anticipated d/c date is: > 3 days              Patient currently is not medically stable to d/c.     Consults  :  none  Procedures  : None  DVT Prophylaxis  : [place] lovenox  Lab Results  Component Value Date   PLT 349 05/05/2020    Antibiotics  :    Anti-infectives (From admission, onward)   Start     Dose/Rate Route Frequency Ordered Stop   05/01/20 1000  remdesivir 100 mg in sodium chloride 0.9 % 100 mL IVPB       "Followed by" Linked Group Details   100 mg 200 mL/hr over 30 Minutes Intravenous Daily 04/30/20 1444 05/04/20 0944   04/30/20 1600  remdesivir 200 mg in sodium chloride 0.9% 250 mL IVPB       "Followed by" Linked Group Details   200 mg 580 mL/hr over 30 Minutes Intravenous Once 04/30/20  1444 04/30/20 2110        Objective:   Vitals:   05/04/20 1557 05/04/20 1558 05/04/20 1933 05/05/20 0821  BP: 100/70 100/70 101/66 99/69  Pulse: 84 80 82 78  Resp: 18 20 18 18   Temp: 99.2 F (37.3 C) 99.2 F  (37.3 C) 98.4 F (36.9 C) 98.1 F (36.7 C)  TempSrc:  Oral Oral   SpO2: 98% 97% 96%   Weight:      Height:        Wt Readings from Last 3 Encounters:  05/01/20 80.7 kg  04/27/20 80.7 kg  12/06/19 82.6 kg    No intake or output data in the 24 hours ending 05/05/20 1118   Physical Exam  Awake Alert, Oriented X 3, No new F.N deficits, Normal affect Symmetrical Chest wall movement, Good air movement bilaterally, CTAB RRR,No Gallops,Rubs or new Murmurs, No Parasternal Heave +ve B.Sounds, Abd Soft, No tenderness, No rebound - guarding or rigidity. No Cyanosis, Clubbing or edema, left lower extremity in a cast     Data Review:    CBC Recent Labs  Lab 05/01/20 0621 05/02/20 0438 05/03/20 0250 05/04/20 0109 05/05/20 0301  WBC 7.9 9.6 11.4* 14.1* 13.7*  HGB 11.9* 11.6* 11.4* 11.5* 11.1*  HCT 37.4 36.7 36.1 36.0 34.5*  PLT 277 311 337 329 349  MCV 87.4 88.0 87.6 85.1 85.0  MCH 27.8 27.8 27.7 27.2 27.3  MCHC 31.8 31.6 31.6 31.9 32.2  RDW 13.6 13.7 13.4 13.3 13.2  LYMPHSABS 0.7 1.3 1.2 1.7 1.0  MONOABS 0.3 0.5 0.7 1.1* 0.8  EOSABS 0.0 0.0 0.0 0.0 0.1  BASOSABS 0.0 0.1 0.1 0.0 0.0    Chemistries  Recent Labs  Lab 05/01/20 0621 05/02/20 0438 05/03/20 0250 05/04/20 0109 05/05/20 0301  NA 139 139 141 137 136  K 4.1 4.3 4.5 3.7 3.4*  CL 104 103 103 101 99  CO2 23 24 26 26 26   GLUCOSE 123* 127* 125* 91 117*  BUN 12 23* 22* 24* 14  CREATININE 0.70 0.95 0.89 0.85 0.60  CALCIUM 8.6* 8.5* 8.8* 8.4* 8.3*  MG 2.0 2.2 2.3 2.0 2.1  AST 43* 46* 83* 87* 38  ALT 27 30 56* 88* 59*  ALKPHOS 80 69 73 74 83  BILITOT 0.3 0.3 0.5 0.5 0.5   ------------------------------------------------------------------------------------------------------------------ No results for input(s): CHOL, HDL, LDLCALC, TRIG, CHOLHDL, LDLDIRECT in the last 72 hours.  No results found for:  HGBA1C ------------------------------------------------------------------------------------------------------------------ No results for input(s): TSH, T4TOTAL, T3FREE, THYROIDAB in the last 72 hours.  Invalid input(s): FREET3 ------------------------------------------------------------------------------------------------------------------ Recent Labs    05/04/20 0109 05/05/20 0301  FERRITIN 293 249    Coagulation profile No results for input(s): INR, PROTIME in the last 168 hours.  Recent Labs    05/04/20 0109 05/05/20 0301  DDIMER 1.54* 0.93*    Cardiac Enzymes No results for input(s): CKMB, TROPONINI, MYOGLOBIN in the last 168 hours.  Invalid input(s): CK ------------------------------------------------------------------------------------------------------------------    Component Value Date/Time   BNP 28.7 04/30/2020 1730    Inpatient Medications  Scheduled Meds: . AeroChamber Plus Flo-Vu Medium  1 each Other Once  . aspirin EC  81 mg Oral Daily  . baricitinib  4 mg Oral Daily  . enoxaparin (LOVENOX) injection  40 mg Subcutaneous Q24H  . feeding supplement (ENSURE ENLIVE)  237 mL Oral BID BM  . metoprolol tartrate  12.5 mg Oral BID  . mometasone-formoterol  2 puff Inhalation BID  . montelukast  10 mg Oral Daily  .  pantoprazole  40 mg Oral Daily  . rosuvastatin  20 mg Oral Daily  . senna  1 tablet Oral Daily   Continuous Infusions:  PRN Meds:.acetaminophen, albuterol, bisacodyl, guaiFENesin-dextromethorphan, ondansetron **OR** ondansetron (ZOFRAN) IV, polyethylene glycol, sodium phosphate  Micro Results Recent Results (from the past 240 hour(s))  Blood Culture (routine x 2)     Status: None (Preliminary result)   Collection Time: 04/30/20 12:30 PM   Specimen: BLOOD RIGHT ARM  Result Value Ref Range Status   Specimen Description BLOOD RIGHT ARM  Final   Special Requests   Final    BOTTLES DRAWN AEROBIC AND ANAEROBIC Blood Culture adequate volume    Culture   Final    NO GROWTH 4 DAYS Performed at [facility], [place] [address] [place], [place] [address]    Report Status PENDING  Incomplete  SARS Coronavirus 2 by RT PCR (hospital order, performed in [facility] hospital lab) Nasopharyngeal Nasopharyngeal Swab     Status: Abnormal   Collection Time: 04/30/20 12:40 PM   Specimen: Nasopharyngeal Swab  Result Value Ref Range Status   SARS Coronavirus 2 POSITIVE (A) NEGATIVE Final    Comment: (NOTE) SARS-CoV-2 target nucleic acids are DETECTED  SARS-CoV-2 RNA is generally detectable in upper respiratory specimens  during the acute phase of infection.  Positive results are indicative  of the presence of the identified virus, but do not rule out bacterial infection or co-infection with other pathogens not detected by the test.  Clinical correlation with patient history and  other diagnostic information is necessary to determine patient infection status.  The expected result is negative.  Fact Sheet for Patients:   StrictlyIdeas.no   Fact Sheet for Healthcare Providers:   BankingDealers.co.za    This test is not yet approved or cleared by the [place] FDA and  has been authorized for detection and/or diagnosis of SARS-CoV-2 by FDA under an Emergency Use Authorization (EUA).  This EUA will remain in effect (meaning this test can be used) for the duration of  the COVID-19 declaration under Section 564(b)(1)  of the Act, 21 U.S.C. section 360-bbb-3(b)(1), unless the authorization is terminated or revoked sooner.  Performed at [facility], [place] [address] [place], [place] [address]   Blood Culture (routine x 2)     Status: None (Preliminary result)   Collection Time: 04/30/20  2:10 PM   Specimen: BLOOD RIGHT FOREARM  Result Value Ref Range Status   Specimen Description BLOOD RIGHT FOREARM  Final   Special Requests   Final    BOTTLES DRAWN AEROBIC AND ANAEROBIC Blood  Culture results may not be optimal due to an inadequate volume of blood received in culture bottles   Culture   Final    NO GROWTH 4 DAYS Performed at [facility], [place] [address] [place], [place] [address]    Report Status PENDING  Incomplete    Radiology Reports DG Ankle Complete Left  Result Date: 04/27/2020 CLINICAL DATA:  The patient suffered left medial and lateral malleolar fractures in a fall today. Post reduction imaging. Initial encounter. EXAM: LEFT ANKLE COMPLETE - 3+ VIEW COMPARISON:  Plain films of the left ankle earlier today. FINDINGS: The patient is now in a fiberglass cast. Position and alignment of the patient's medial and lateral malleolar fractures are markedly improved. No new abnormality. IMPRESSION: Marked improvement in position and alignment of medial and lateral malleolar fractures. No new abnormality. Electronically Signed   By: Inge Rise  M.D.   On: 04/27/2020 15:24   DG Ankle Complete Left  Result Date: 04/27/2020 CLINICAL DATA:  The patient suffered a left ankle injury in a fall today. Initial encounter. EXAM: LEFT ANKLE COMPLETE - 3+ VIEW COMPARISON:  None. FINDINGS: The patient has acute medial and lateral malleolar fractures. Both fractures are laterally displaced approximately 1 shaft width. The talus is laterally subluxed 1.1 cm. No other acute bony or joint abnormality is identified. There is soft tissue swelling about the patient's fractures. Pins for hallux valgus repair in the distal first metatarsal noted. IMPRESSION: Acute, displaced medial and lateral malleolar fractures as described. Electronically Signed   By: Inge Rise M.D.   On: 04/27/2020 14:11   DG Chest Port 1 View  Result Date: 04/30/2020 CLINICAL DATA:  Cough and shortness of breath. Recent COVID diagnosis. EXAM: PORTABLE CHEST 1 VIEW COMPARISON:  Chest x-ray dated May 15, 2016. FINDINGS: The heart size and mediastinal contours are within normal limits. Streaky linear and  reticulonodular interstitial thickening and opacity at both lung bases. No focal consolidation, pleural effusion, or pneumothorax. No acute osseous abnormality. IMPRESSION: 1. Bilateral lower lobe interstitial and airspace disease, consistent with atypical infection/viral pneumonia. Electronically Signed   By: Titus Dubin M.D.   On: 04/30/2020 12:23      Phillips Climes M.D on 05/05/2020 at 11:18 AM    [facility] -  Office  [phone]    maalox

## 2020-05-06 LAB — COMPREHENSIVE METABOLIC PANEL
ALT: 52 U/L — ABNORMAL HIGH (ref 0–44)
AST: 36 U/L (ref 15–41)
Albumin: 2.6 g/dL — ABNORMAL LOW (ref 3.5–5.0)
Alkaline Phosphatase: 85 U/L (ref 38–126)
Anion gap: 11 (ref 5–15)
BUN: 15 mg/dL (ref 6–20)
CO2: 24 mmol/L (ref 22–32)
Calcium: 8.2 mg/dL — ABNORMAL LOW (ref 8.9–10.3)
Chloride: 102 mmol/L (ref 98–111)
Creatinine, Ser: 0.72 mg/dL (ref 0.44–1.00)
GFR calc Af Amer: >60 mL/min (ref >60)
GFR calc non Af Amer: >60 mL/min (ref >60)
Glucose, Bld: 112 mg/dL — ABNORMAL HIGH (ref 70–99)
Potassium: 3.8 mmol/L (ref 3.5–5.1)
Sodium: 137 mmol/L (ref 135–145)
Total Bilirubin: 0.6 mg/dL (ref 0.3–1.2)
Total Protein: 5.9 g/dL — ABNORMAL LOW (ref 6.5–8.1)

## 2020-05-06 LAB — C-REACTIVE PROTEIN: CRP: 17.1 mg/dL — ABNORMAL HIGH (ref <1.0)

## 2020-05-06 LAB — CBC
HCT: 35.5 % — ABNORMAL LOW (ref 36.0–46.0)
Hemoglobin: 11.4 g/dL — ABNORMAL LOW (ref 12.0–15.0)
MCH: 27.7 pg (ref 26.0–34.0)
MCHC: 32.1 g/dL (ref 30.0–36.0)
MCV: 86.4 fL (ref 80.0–100.0)
Platelets: 356 10*3/uL (ref 150–400)
RBC: 4.11 MIL/uL (ref 3.87–5.11)
RDW: 13.3 % (ref 11.5–15.5)
WBC: 11.2 10*3/uL — ABNORMAL HIGH (ref 4.0–10.5)
nRBC: 0 % (ref 0.0–0.2)

## 2020-05-06 LAB — D-DIMER, QUANTITATIVE: D-Dimer, Quant: 0.74 ug/mL-FEU — ABNORMAL HIGH (ref 0.00–0.50)

## 2020-05-06 MED ORDER — SENNA 8.6 MG PO TABS
2.0000 | ORAL_TABLET | Freq: Every day | ORAL | Status: DC
  Administered 2020-05-07 – 2020-05-14 (×7): 17.2 mg via ORAL
  Filled 2020-05-06 (×7): qty 2

## 2020-05-06 MED ORDER — SALINE SPRAY 0.65 % NA SOLN
1.0000 | NASAL | Status: DC | PRN
  Filled 2020-05-06: qty 44

## 2020-05-06 NOTE — Progress Notes (Signed)
PROGRESS NOTE                                                                                                                                                                                                             Patient Demographics:    Debbie Allen, is a 54 y.o. female, DOB - 1966/08/12, NLZ:[id number]  Admit date - 04/30/2020   Admitting Physician Mckinley Jewel, MD  Outpatient Primary MD for the patient is Patient, No Pcp Per  LOS - 6   Chief Complaint  Patient presents with  . Shortness of Breath       Brief Narrative    Debbie Allen is a 54 y.o. female with medical history significant of paroxysmal supraventricular tachycardia, acid reflux, hyperlipidemia presents to emergency department with worsening cough and shortness of breath.  Patient tells me that she has symptoms since 8/14 and tested positive for COVID-19 on 8/16 at her PCP office.  Reports cough, congestion, shortness of breath, poor p.o. intake, generalized weakness, body ache, nausea, decreased oxygen saturation at home.  Reports that her oxygen saturation was noted to be low at 80% on room air at home, lightheadedness and dizziness.  Reports that her symptoms got worse this morning therefore she decided to come to the ER for further evaluation and management.  She presented to ER on 04/27/2020 syncopal episode and had left medial and lateral malleolar fracture and underwent reduction and was splinted and discharged home in stable condition.  She denies headache, chest pain, wheezing, leg swelling/redness, fever, chills, vomiting, diarrhea, abdominal pain, urinary or bowel changes, PE/DVT.  She is not vaccinated against COVID-19.   Subjective:    Sham Schaus today has any dizziness or lightheadedness with activity, still reports cough, and shortness of breath, but it is improving.  No urinary retention,.   Assessment  & Plan :    Principal Problem:   Acute hypoxemic  respiratory failure due to COVID-19 [facility]) Active Problems:   PSVT (paroxysmal supraventricular tachycardia) (HCC)   Dyslipidemia   Acid reflux   Closed left ankle fracture  Acute hypoxemic respiratory failure due to COVID-19 pneumonia:  -She remains with significant hypoxia, she is on 6 L high flow nasal cannula. -Continue with IV steroids . -Treated with IV remdesivir . -Continue with Baricitinib. -Have discussed with patient again, have encouraged her  to do incentive spirometry, flutter valve, and out of bed to chair with staff assistant, PT/OT consulted . -Continue to trend inflammatory markers closely.  SpO2: 94 % O2 Flow Rate (L/min): 9 L/min  COVID-19 Labs  Recent Labs    05/04/20 0109 05/05/20 0301 05/06/20 0309  DDIMER 1.54* 0.93* 0.74*  FERRITIN 293 249  --   CRP  --   --  17.1*    Lab Results  Component Value Date   SARSCOV2NAA POSITIVE (A) 04/30/2020   SARSCOV2NAA Not Detected 09/28/2019   [place] Not Detected 09/12/2019   [place] Not Detected 06/21/2019       Hypotension:  - resolved with fluid bolus. -Blood pressure improved, metoprolol and verapamil stopped given some mild pauses/ -She is started on Midodrin. -Hold narcotics.  Hypokalemia -Repleted.  PSVT: Reviewed EKG.  Stable.  Patient denies any chest pain or palpitation. -Hold given soft blood pressure, they were resumed at a lower dose, but he had 4-second pause yesterday, so I have stopped verapamil, and decreased metoprolol to 12.5 mg p.o. twice daily, continue with telemetry monitoring.  Left ankle fracture: Reviewed recent x-ray of left ankle. -Lateral and medial malleolar fracture.  Reduced and splinted in ER on 04/27/2020. -Tylenol as needed for pain control. -Orthopedic surgery is following-plan is for internal fixation once patient stabilizes.  discssed with Dr. Stann Mainland, she will need a internal fixation in OR, but given her respiratory status this will be on hold for now  till  respiratory status improve.  Transaminitis -Due to Covid, stable  Mild intermittent asthma: -Continue albuterol, Symbicort, Singulair.  No active wheezing today, will keep on Combivent scheduled.  Hyperlipidemia: Continue statin   COVID-19 Labs  Recent Labs    05/04/20 0109 05/05/20 0301 05/06/20 0309  DDIMER 1.54* 0.93* 0.74*  FERRITIN 293 249  --   CRP  --   --  17.1*    Lab Results  Component Value Date   SARSCOV2NAA POSITIVE (A) 04/30/2020   SARSCOV2NAA Not Detected 09/28/2019   [place] Not Detected 09/12/2019   [place] Not Detected 06/21/2019     Code Status : Full  Family Communication  : D/W husband by phone daily.  Disposition Plan  :  Status is: Inpatient  Remains inpatient appropriate because:IV treatments appropriate due to intensity of illness or inability to take PO   Dispo: The patient is from: Home              Anticipated d/c is to: Home              Anticipated d/c date is: > 3 days              Patient currently is not medically stable to d/c.     Consults  :  none  Procedures  : None  DVT Prophylaxis  : [place] lovenox  Lab Results  Component Value Date   PLT 356 05/06/2020    Antibiotics  :    Anti-infectives (From admission, onward)   Start     Dose/Rate Route Frequency Ordered Stop   05/01/20 1000  remdesivir 100 mg in sodium chloride 0.9 % 100 mL IVPB       "Followed by" Linked Group Details   100 mg 200 mL/hr over 30 Minutes Intravenous Daily 04/30/20 1444 05/04/20 0944   04/30/20 1600  remdesivir 200 mg in sodium chloride 0.9% 250 mL IVPB       "Followed by" Linked Group Details   200 mg 580 mL/hr over  30 Minutes Intravenous Once 04/30/20 1444 04/30/20 2110        Objective:   Vitals:   05/05/20 1532 05/05/20 1622 05/05/20 2151 05/06/20 0828  BP:  111/70 100/76 99/74  Pulse:  74 80 92  Resp: (!) 23 17 19 20   Temp:  98.2 F (36.8 C) 98 F (36.7 C) 98.8 F (37.1 C)  TempSrc:   Oral   SpO2:   99% 94%    Weight:      Height:        Wt Readings from Last 3 Encounters:  05/01/20 80.7 kg  04/27/20 80.7 kg  12/06/19 82.6 kg     Intake/Output Summary (Last 24 hours) at 05/06/2020 1231 Last data filed at 05/06/2020 0957 Gross per 24 hour  Intake --  Output 1 ml  Net -1 ml     Physical Exam  Awake Alert, Oriented X 3, No new F.N deficits, Normal affect Symmetrical Chest wall movement, Good air movement bilaterally, CTAB RRR,No Gallops,Rubs or new Murmurs, No Parasternal Heave +ve B.Sounds, Abd Soft, No tenderness, No rebound - guarding or rigidity. No Cyanosis, Clubbing or edema, left lower extremity in a cast     Data Review:    CBC Recent Labs  Lab 05/01/20 0621 05/01/20 0621 05/02/20 0438 05/03/20 0250 05/04/20 0109 05/05/20 0301 05/06/20 0309  WBC 7.9   < > 9.6 11.4* 14.1* 13.7* 11.2*  HGB 11.9*   < > 11.6* 11.4* 11.5* 11.1* 11.4*  HCT 37.4   < > 36.7 36.1 36.0 34.5* 35.5*  PLT 277   < > 311 337 329 349 356  MCV 87.4   < > 88.0 87.6 85.1 85.0 86.4  MCH 27.8   < > 27.8 27.7 27.2 27.3 27.7  MCHC 31.8   < > 31.6 31.6 31.9 32.2 32.1  RDW 13.6   < > 13.7 13.4 13.3 13.2 13.3  LYMPHSABS 0.7  --  1.3 1.2 1.7 1.0  --   MONOABS 0.3  --  0.5 0.7 1.1* 0.8  --   EOSABS 0.0  --  0.0 0.0 0.0 0.1  --   BASOSABS 0.0  --  0.1 0.1 0.0 0.0  --    < > = values in this interval not displayed.    Chemistries  Recent Labs  Lab 05/01/20 0621 05/01/20 0621 05/02/20 0438 05/03/20 0250 05/04/20 0109 05/05/20 0301 05/06/20 0309  NA 139   < > 139 141 137 136 137  K 4.1   < > 4.3 4.5 3.7 3.4* 3.8  CL 104   < > 103 103 101 99 102  CO2 23   < > 24 26 26 26 24   GLUCOSE 123*   < > 127* 125* 91 117* 112*  BUN 12   < > 23* 22* 24* 14 15  CREATININE 0.70   < > 0.95 0.89 0.85 0.60 0.72  CALCIUM 8.6*   < > 8.5* 8.8* 8.4* 8.3* 8.2*  MG 2.0  --  2.2 2.3 2.0 2.1  --   AST 43*   < > 46* 83* 87* 38 36  ALT 27   < > 30 56* 88* 59* 52*  ALKPHOS 80   < > 69 73 74 83 85  BILITOT 0.3   < >  0.3 0.5 0.5 0.5 0.6   < > = values in this interval not displayed.   ------------------------------------------------------------------------------------------------------------------ No results for input(s): CHOL, HDL, LDLCALC, TRIG, CHOLHDL, LDLDIRECT in the last 72 hours.  No  results found for: HGBA1C ------------------------------------------------------------------------------------------------------------------ No results for input(s): TSH, T4TOTAL, T3FREE, THYROIDAB in the last 72 hours.  Invalid input(s): FREET3 ------------------------------------------------------------------------------------------------------------------ Recent Labs    05/04/20 0109 05/05/20 0301  FERRITIN 293 249    Coagulation profile No results for input(s): INR, PROTIME in the last 168 hours.  Recent Labs    05/05/20 0301 05/06/20 0309  DDIMER 0.93* 0.74*    Cardiac Enzymes No results for input(s): CKMB, TROPONINI, MYOGLOBIN in the last 168 hours.  Invalid input(s): CK ------------------------------------------------------------------------------------------------------------------    Component Value Date/Time   BNP 28.7 04/30/2020 1730    Inpatient Medications  Scheduled Meds: . AeroChamber Plus Flo-Vu Medium  1 each Other Once  . aspirin EC  81 mg Oral Daily  . baricitinib  4 mg Oral Daily  . enoxaparin (LOVENOX) injection  40 mg Subcutaneous Q24H  . feeding supplement (ENSURE ENLIVE)  237 mL Oral BID BM  . metoprolol tartrate  12.5 mg Oral BID  . midodrine  2.5 mg Oral BID WC  . mometasone-formoterol  2 puff Inhalation BID  . montelukast  10 mg Oral Daily  . pantoprazole  40 mg Oral Daily  . rosuvastatin  20 mg Oral Daily  . senna  1 tablet Oral Daily   Continuous Infusions:  PRN Meds:.acetaminophen, albuterol, alum & mag hydroxide-simeth, bisacodyl, guaiFENesin-dextromethorphan, ondansetron **OR** ondansetron (ZOFRAN) IV, polyethylene glycol, sodium chloride, sodium  phosphate  Micro Results Recent Results (from the past 240 hour(s))  Blood Culture (routine x 2)     Status: None   Collection Time: 04/30/20 12:30 PM   Specimen: BLOOD RIGHT ARM  Result Value Ref Range Status   Specimen Description BLOOD RIGHT ARM  Final   Special Requests   Final    BOTTLES DRAWN AEROBIC AND ANAEROBIC Blood Culture adequate volume   Culture   Final    NO GROWTH 5 DAYS Performed at [facility], [address], [place], [place] [address]    Report Status 05/05/2020 FINAL  Final  SARS Coronavirus 2 by RT PCR (hospital order, performed in [facility] hospital lab) Nasopharyngeal Nasopharyngeal Swab     Status: Abnormal   Collection Time: 04/30/20 12:40 PM   Specimen: Nasopharyngeal Swab  Result Value Ref Range Status   SARS Coronavirus 2 POSITIVE (A) NEGATIVE Final    Comment: (NOTE) SARS-CoV-2 target nucleic acids are DETECTED  SARS-CoV-2 RNA is generally detectable in upper respiratory specimens  during the acute phase of infection.  Positive results are indicative  of the presence of the identified virus, but do not rule out bacterial infection or co-infection with other pathogens not detected by the test.  Clinical correlation with patient history and  other diagnostic information is necessary to determine patient infection status.  The expected result is negative.  Fact Sheet for Patients:   StrictlyIdeas.no   Fact Sheet for Healthcare Providers:   BankingDealers.co.za    This test is not yet approved or cleared by the [place] FDA and  has been authorized for detection and/or diagnosis of SARS-CoV-2 by FDA under an Emergency Use Authorization (EUA).  This EUA will remain in effect (meaning this test can be used) for the duration of  the COVID-19 declaration under Section 564(b)(1)  of the Act, 21 U.S.C. section 360-bbb-3(b)(1), unless the authorization is terminated or revoked  sooner.  Performed at [facility], [place] [address], [place], [place] [address]   Blood Culture (routine x 2)     Status: None  Collection Time: 04/30/20  2:10 PM   Specimen: BLOOD RIGHT FOREARM  Result Value Ref Range Status   Specimen Description BLOOD RIGHT FOREARM  Final   Special Requests   Final    BOTTLES DRAWN AEROBIC AND ANAEROBIC Blood Culture results may not be optimal due to an inadequate volume of blood received in culture bottles   Culture   Final    NO GROWTH 5 DAYS Performed at [facility], [place] [address], [place], [place] [address]    Report Status 05/05/2020 FINAL  Final    Radiology Reports DG Ankle Complete Left  Result Date: 04/27/2020 CLINICAL DATA:  The patient suffered left medial and lateral malleolar fractures in a fall today. Post reduction imaging. Initial encounter. EXAM: LEFT ANKLE COMPLETE - 3+ VIEW COMPARISON:  Plain films of the left ankle earlier today. FINDINGS: The patient is now in a fiberglass cast. Position and alignment of the patient's medial and lateral malleolar fractures are markedly improved. No new abnormality. IMPRESSION: Marked improvement in position and alignment of medial and lateral malleolar fractures. No new abnormality. Electronically Signed   By: Inge Rise M.D.   On: 04/27/2020 15:24   DG Ankle Complete Left  Result Date: 04/27/2020 CLINICAL DATA:  The patient suffered a left ankle injury in a fall today. Initial encounter. EXAM: LEFT ANKLE COMPLETE - 3+ VIEW COMPARISON:  None. FINDINGS: The patient has acute medial and lateral malleolar fractures. Both fractures are laterally displaced approximately 1 shaft width. The talus is laterally subluxed 1.1 cm. No other acute bony or joint abnormality is identified. There is soft tissue swelling about the patient's fractures. Pins for hallux valgus repair in the distal first metatarsal noted. IMPRESSION: Acute, displaced medial and lateral malleolar fractures as described.  Electronically Signed   By: Inge Rise M.D.   On: 04/27/2020 14:11   DG Chest Port 1 View  Result Date: 04/30/2020 CLINICAL DATA:  Cough and shortness of breath. Recent COVID diagnosis. EXAM: PORTABLE CHEST 1 VIEW COMPARISON:  Chest x-ray dated May 15, 2016. FINDINGS: The heart size and mediastinal contours are within normal limits. Streaky linear and reticulonodular interstitial thickening and opacity at both lung bases. No focal consolidation, pleural effusion, or pneumothorax. No acute osseous abnormality. IMPRESSION: 1. Bilateral lower lobe interstitial and airspace disease, consistent with atypical infection/viral pneumonia. Electronically Signed   By: Titus Dubin M.D.   On: 04/30/2020 12:23      Phillips Climes M.D on 05/06/2020 at 12:31 PM    [facility] -  Office  [phone]    maalox

## 2020-05-07 ENCOUNTER — Inpatient Hospital Stay (HOSPITAL_COMMUNITY): Payer: BC Managed Care – PPO

## 2020-05-07 DIAGNOSIS — I351 Nonrheumatic aortic (valve) insufficiency: Secondary | ICD-10-CM

## 2020-05-07 LAB — C-REACTIVE PROTEIN: CRP: 13.1 mg/dL — ABNORMAL HIGH (ref <1.0)

## 2020-05-07 LAB — CBC
HCT: 33.4 % — ABNORMAL LOW (ref 36.0–46.0)
Hemoglobin: 10.7 g/dL — ABNORMAL LOW (ref 12.0–15.0)
MCH: 27.6 pg (ref 26.0–34.0)
MCHC: 32 g/dL (ref 30.0–36.0)
MCV: 86.1 fL (ref 80.0–100.0)
Platelets: 394 10*3/uL (ref 150–400)
RBC: 3.88 MIL/uL (ref 3.87–5.11)
RDW: 13.2 % (ref 11.5–15.5)
WBC: 9.9 10*3/uL (ref 4.0–10.5)
nRBC: 0 % (ref 0.0–0.2)

## 2020-05-07 LAB — COMPREHENSIVE METABOLIC PANEL
ALT: 70 U/L — ABNORMAL HIGH (ref 0–44)
AST: 59 U/L — ABNORMAL HIGH (ref 15–41)
Albumin: 2.5 g/dL — ABNORMAL LOW (ref 3.5–5.0)
Alkaline Phosphatase: 101 U/L (ref 38–126)
Anion gap: 11 (ref 5–15)
BUN: 11 mg/dL (ref 6–20)
CO2: 25 mmol/L (ref 22–32)
Calcium: 8.2 mg/dL — ABNORMAL LOW (ref 8.9–10.3)
Chloride: 98 mmol/L (ref 98–111)
Creatinine, Ser: 0.67 mg/dL (ref 0.44–1.00)
GFR calc Af Amer: >60 mL/min (ref >60)
GFR calc non Af Amer: >60 mL/min (ref >60)
Glucose, Bld: 114 mg/dL — ABNORMAL HIGH (ref 70–99)
Potassium: 3.6 mmol/L (ref 3.5–5.1)
Sodium: 134 mmol/L — ABNORMAL LOW (ref 135–145)
Total Bilirubin: 0.5 mg/dL (ref 0.3–1.2)
Total Protein: 6 g/dL — ABNORMAL LOW (ref 6.5–8.1)

## 2020-05-07 LAB — ECHOCARDIOGRAM LIMITED
Area-P 1/2: 2.91 cm2
Height: 62 in
P 1/2 time: 750 msec
S' Lateral: 3.3 cm
Weight: 2848 oz

## 2020-05-07 LAB — D-DIMER, QUANTITATIVE: D-Dimer, Quant: 0.83 ug/mL-FEU — ABNORMAL HIGH (ref 0.00–0.50)

## 2020-05-07 MED ORDER — METHYLPREDNISOLONE SODIUM SUCC 125 MG IJ SOLR
60.0000 mg | Freq: Two times a day (BID) | INTRAMUSCULAR | Status: DC
  Administered 2020-05-07 – 2020-05-08 (×3): 60 mg via INTRAVENOUS
  Filled 2020-05-07 (×3): qty 2

## 2020-05-07 MED ORDER — MENTHOL 3 MG MT LOZG
1.0000 | LOZENGE | OROMUCOSAL | Status: DC | PRN
  Administered 2020-05-07 – 2020-05-08 (×2): 3 mg via ORAL
  Filled 2020-05-07 (×2): qty 9

## 2020-05-07 MED ORDER — DIPHENHYDRAMINE HCL 25 MG PO CAPS
25.0000 mg | ORAL_CAPSULE | Freq: Once | ORAL | Status: AC
  Administered 2020-05-07: 25 mg via ORAL
  Filled 2020-05-07: qty 1

## 2020-05-07 NOTE — Progress Notes (Signed)
PROGRESS NOTE                                                                                                                                                                                                             Patient Demographics:    Debbie Allen, is a 54 y.o. female, DOB - 1965/12/27, OEU:[id number]  Admit date - 04/30/2020   Admitting Physician Mckinley Jewel, MD  Outpatient Primary MD for the patient is Patient, No Pcp Per  LOS - 7   Chief Complaint  Patient presents with  . Shortness of Breath       Brief Narrative    [name] is a 54 y.o. female with medical history significant of paroxysmal supraventricular tachycardia, acid reflux, hyperlipidemia presents to emergency department with worsening cough and shortness of breath.  Patient tells me that she has symptoms since 8/14 and tested positive for COVID-19 on 8/16 at her PCP office.  Reports cough, congestion, shortness of breath, poor p.o. intake, generalized weakness, body ache, nausea, decreased oxygen saturation at home.  Reports that her oxygen saturation was noted to be low at 80% on room air at home, lightheadedness and dizziness.  Reports that her symptoms got worse this morning therefore she decided to come to the ER for further evaluation and management.  She presented to ER on 04/27/2020 syncopal episode and had left medial and lateral malleolar fracture and underwent reduction and was splinted and discharged home in stable condition.  She denies headache, chest pain, wheezing, leg swelling/redness, fever, chills, vomiting, diarrhea, abdominal pain, urinary or bowel changes, PE/DVT.  She is not vaccinated against COVID-19.   Subjective:    [name] today has any dizziness or lightheadedness with activity, no urinary retention, dyspnea is improving, she still reporting cough .   Assessment  & Plan :    Principal Problem:   Acute hypoxemic respiratory failure due to  COVID-19 [facility]) Active Problems:   PSVT (paroxysmal supraventricular tachycardia) (HCC)   Dyslipidemia   Acid reflux   Closed left ankle fracture  Acute hypoxemic respiratory failure due to COVID-19 pneumonia:  -She was requiring up to 15 L oxygen via nasal cannula, this has been improving, this morning she is on 4 L nasal cannula . -Continue with IV steroids . -Treated with IV remdesivir . -Continue with Baricitinib. -Have  discussed with patient again, have encouraged her to do incentive spirometry, flutter valve, and out of bed to chair with staff assistant, PT/OT consulted . -Continue to trend inflammatory markers closely.  SpO2: 99 % O2 Flow Rate (L/min): 3 L/min  COVID-19 Labs  Recent Labs    05/05/20 0301 05/06/20 0309 05/07/20 0434  DDIMER 0.93* 0.74* 0.83*  FERRITIN 249  --   --   CRP  --  17.1* 13.1*    Lab Results  Component Value Date   SARSCOV2NAA POSITIVE (A) 04/30/2020   SARSCOV2NAA Not Detected 09/28/2019   [place] Not Detected 09/12/2019   [place] Not Detected 06/21/2019       Hypotension:  - resolved with fluid bolus. -Blood pressure improved, metoprolol and verapamil stopped given 4 sec  pause on telemetry. -She is started on low-dose midodrine given soft blood pressure, and dizziness/lightheadedness with activity . -I will obtain 2D echo in anticipation for need for surgical repair of her left ankle fracture . -Hold narcotics.  Hypokalemia -Repleted.  PSVT: Reviewed EKG.  Stable.  Patient denies any chest pain or palpitation. -Held metoprolol and verapamil given soft blood pressure, they were resumed at a lower dose, but he had 4-second pause yesterday, so I have stopped verapamil, and decreased metoprolol to 12.5 mg p.o. twice daily, continue with telemetry monitoring.  Left ankle fracture: Reviewed recent x-ray of left ankle. -Lateral and medial malleolar fracture.  Reduced and splinted in ER on 04/27/2020. -Tylenol as needed for pain  control. -Orthopedic surgery is following-plan is for internal fixation once patient stabilizes.  discssed with Dr. Stann Mainland, she will need a internal fixation in OR, Ortho were hoping during hospital stay, possibly Friday, for now we will have to await her clinical improvement, will obtain 2D echo given her hypotension, will discuss further with Ortho later in the week.  Transaminitis -Due to Covid, stable  Mild intermittent asthma: -Continue albuterol, Symbicort, Singulair.  No active wheezing today, will keep on Combivent scheduled.  Hyperlipidemia: Continue statin   COVID-19 Labs  Recent Labs    05/05/20 0301 05/06/20 0309 05/07/20 0434  DDIMER 0.93* 0.74* 0.83*  FERRITIN 249  --   --   CRP  --  17.1* 13.1*    Lab Results  Component Value Date   SARSCOV2NAA POSITIVE (A) 04/30/2020   SARSCOV2NAA Not Detected 09/28/2019   [place] Not Detected 09/12/2019   [place] Not Detected 06/21/2019     Code Status : Full  Family Communication  : D/W husband by phone daily.  Disposition Plan  :  Status is: Inpatient  Remains inpatient appropriate because:IV treatments appropriate due to intensity of illness or inability to take PO   Dispo: The patient is from: Home              Anticipated d/c is to: Home              Anticipated d/c date is: > 3 days              Patient currently is not medically stable to d/c.     Consults  :  none  Procedures  : None  DVT Prophylaxis  : [place] lovenox  Lab Results  Component Value Date   PLT 394 05/07/2020    Antibiotics  :    Anti-infectives (From admission, onward)   Start     Dose/Rate Route Frequency Ordered Stop   05/01/20 1000  remdesivir 100 mg in sodium chloride 0.9 % 100 mL IVPB       "  Followed by" Linked Group Details   100 mg 200 mL/hr over 30 Minutes Intravenous Daily 04/30/20 1444 05/04/20 0944   04/30/20 1600  remdesivir 200 mg in sodium chloride 0.9% 250 mL IVPB       "Followed by" Linked Group  Details   200 mg 580 mL/hr over 30 Minutes Intravenous Once 04/30/20 1444 04/30/20 2110        Objective:   Vitals:   05/06/20 1637 05/06/20 1932 05/07/20 0734 05/07/20 1005  BP: 100/70 105/78 98/73   Pulse: 68 92 70   Resp: 20 18 20    Temp: 99.1 F (37.3 C) 98.4 F (36.9 C) 98.4 F (36.9 C)   TempSrc:  Oral    SpO2: 100% 100% 99% 99%  Weight:      Height:        Wt Readings from Last 3 Encounters:  05/01/20 80.7 kg  04/27/20 80.7 kg  12/06/19 82.6 kg     Intake/Output Summary (Last 24 hours) at 05/07/2020 1105 Last data filed at 05/06/2020 1800 Gross per 24 hour  Intake 240 ml  Output 2 ml  Net 238 ml     Physical Exam  Awake Alert, Oriented X 3,sitting  In a  recliner ,no new F.N deficits, Normal affect Symmetrical Chest wall movement, Good air movement bilaterally, CTAB RRR,No Gallops,Rubs or new Murmurs, No Parasternal Heave +ve B.Sounds, Abd Soft, No tenderness, No rebound - guarding or rigidity.  No Cyanosis, Clubbing or edema, left lower extremity in a cast     Data Review:    CBC Recent Labs  Lab 05/01/20 0621 05/01/20 0621 05/02/20 0438 05/02/20 0438 05/03/20 0250 05/04/20 0109 05/05/20 0301 05/06/20 0309 05/07/20 0434  WBC 7.9   < > 9.6   < > 11.4* 14.1* 13.7* 11.2* 9.9  HGB 11.9*   < > 11.6*   < > 11.4* 11.5* 11.1* 11.4* 10.7*  HCT 37.4   < > 36.7   < > 36.1 36.0 34.5* 35.5* 33.4*  PLT 277   < > 311   < > 337 329 349 356 394  MCV 87.4   < > 88.0   < > 87.6 85.1 85.0 86.4 86.1  MCH 27.8   < > 27.8   < > 27.7 27.2 27.3 27.7 27.6  MCHC 31.8   < > 31.6   < > 31.6 31.9 32.2 32.1 32.0  RDW 13.6   < > 13.7   < > 13.4 13.3 13.2 13.3 13.2  LYMPHSABS 0.7  --  1.3  --  1.2 1.7 1.0  --   --   MONOABS 0.3  --  0.5  --  0.7 1.1* 0.8  --   --   EOSABS 0.0  --  0.0  --  0.0 0.0 0.1  --   --   BASOSABS 0.0  --  0.1  --  0.1 0.0 0.0  --   --    < > = values in this interval not displayed.    Chemistries  Recent Labs  Lab 05/01/20 0621  05/01/20 3810 05/02/20 0438 05/02/20 0438 05/03/20 0250 05/04/20 0109 05/05/20 0301 05/06/20 0309 05/07/20 0434  NA 139   < > 139   < > 141 137 136 137 134*  K 4.1   < > 4.3   < > 4.5 3.7 3.4* 3.8 3.6  CL 104   < > 103   < > 103 101 99 102 98  CO2 23   < >  24   < > 26 26 26 24 25   GLUCOSE 123*   < > 127*   < > 125* 91 117* 112* 114*  BUN 12   < > 23*   < > 22* 24* 14 15 11   CREATININE 0.70   < > 0.95   < > 0.89 0.85 0.60 0.72 0.67  CALCIUM 8.6*   < > 8.5*   < > 8.8* 8.4* 8.3* 8.2* 8.2*  MG 2.0  --  2.2  --  2.3 2.0 2.1  --   --   AST 43*   < > 46*   < > 83* 87* 38 36 59*  ALT 27   < > 30   < > 56* 88* 59* 52* 70*  ALKPHOS 80   < > 69   < > 73 74 83 85 101  BILITOT 0.3   < > 0.3   < > 0.5 0.5 0.5 0.6 0.5   < > = values in this interval not displayed.   ------------------------------------------------------------------------------------------------------------------ No results for input(s): CHOL, HDL, LDLCALC, TRIG, CHOLHDL, LDLDIRECT in the last 72 hours.  No results found for: HGBA1C ------------------------------------------------------------------------------------------------------------------ No results for input(s): TSH, T4TOTAL, T3FREE, THYROIDAB in the last 72 hours.  Invalid input(s): FREET3 ------------------------------------------------------------------------------------------------------------------ Recent Labs    05/05/20 0301  FERRITIN 249    Coagulation profile No results for input(s): INR, PROTIME in the last 168 hours.  Recent Labs    05/06/20 0309 05/07/20 0434  DDIMER 0.74* 0.83*    Cardiac Enzymes No results for input(s): CKMB, TROPONINI, MYOGLOBIN in the last 168 hours.  Invalid input(s): CK ------------------------------------------------------------------------------------------------------------------    Component Value Date/Time   BNP 28.7 04/30/2020 1730    Inpatient Medications  Scheduled Meds: . AeroChamber Plus Flo-Vu Medium   1 each Other Once  . aspirin EC  81 mg Oral Daily  . baricitinib  4 mg Oral Daily  . enoxaparin (LOVENOX) injection  40 mg Subcutaneous Q24H  . feeding supplement (ENSURE ENLIVE)  237 mL Oral BID BM  . metoprolol tartrate  12.5 mg Oral BID  . midodrine  2.5 mg Oral BID WC  . mometasone-formoterol  2 puff Inhalation BID  . montelukast  10 mg Oral Daily  . pantoprazole  40 mg Oral Daily  . rosuvastatin  20 mg Oral Daily  . senna  2 tablet Oral Daily   Continuous Infusions:  PRN Meds:.acetaminophen, albuterol, alum & mag hydroxide-simeth, bisacodyl, guaiFENesin-dextromethorphan, ondansetron **OR** ondansetron (ZOFRAN) IV, polyethylene glycol, sodium chloride, sodium phosphate  Micro Results Recent Results (from the past 240 hour(s))  Blood Culture (routine x 2)     Status: None   Collection Time: 04/30/20 12:30 PM   Specimen: BLOOD RIGHT ARM  Result Value Ref Range Status   Specimen Description BLOOD RIGHT ARM  Final   Special Requests   Final    BOTTLES DRAWN AEROBIC AND ANAEROBIC Blood Culture adequate volume   Culture   Final    NO GROWTH 5 DAYS Performed at [facility], [address], [place], [place] [address]    Report Status 05/05/2020 FINAL  Final  SARS Coronavirus 2 by RT PCR (hospital order, performed in [facility] hospital lab) Nasopharyngeal Nasopharyngeal Swab     Status: Abnormal   Collection Time: 04/30/20 12:40 PM   Specimen: Nasopharyngeal Swab  Result Value Ref Range Status   SARS Coronavirus 2 POSITIVE (A) NEGATIVE Final    Comment: (NOTE) SARS-CoV-2 target nucleic acids are DETECTED  SARS-CoV-2 RNA is generally detectable in upper respiratory specimens  during the acute phase of infection.  Positive results are indicative  of the presence of the identified virus, but do not rule out bacterial infection or co-infection with other pathogens not detected by the test.  Clinical correlation with patient history and  other diagnostic information is  necessary to determine patient infection status.  The expected result is negative.  Fact Sheet for Patients:   StrictlyIdeas.no   Fact Sheet for Healthcare Providers:   BankingDealers.co.za    This test is not yet approved or cleared by the [place] FDA and  has been authorized for detection and/or diagnosis of SARS-CoV-2 by FDA under an Emergency Use Authorization (EUA).  This EUA will remain in effect (meaning this test can be used) for the duration of  the COVID-19 declaration under Section 564(b)(1)  of the Act, 21 U.S.C. section 360-bbb-3(b)(1), unless the authorization is terminated or revoked sooner.  Performed at [facility], [place] [address], [place], [place] [address]   Blood Culture (routine x 2)     Status: None   Collection Time: 04/30/20  2:10 PM   Specimen: BLOOD RIGHT FOREARM  Result Value Ref Range Status   Specimen Description BLOOD RIGHT FOREARM  Final   Special Requests   Final    BOTTLES DRAWN AEROBIC AND ANAEROBIC Blood Culture results may not be optimal due to an inadequate volume of blood received in culture bottles   Culture   Final    NO GROWTH 5 DAYS Performed at [facility], [place] [address] [place], [place] [address]    Report Status 05/05/2020 FINAL  Final    Radiology Reports DG Ankle Complete Left  Result Date: 04/27/2020 CLINICAL DATA:  The patient suffered left medial and lateral malleolar fractures in a fall today. Post reduction imaging. Initial encounter. EXAM: LEFT ANKLE COMPLETE - 3+ VIEW COMPARISON:  Plain films of the left ankle earlier today. FINDINGS: The patient is now in a fiberglass cast. Position and alignment of the patient's medial and lateral malleolar fractures are markedly improved. No new abnormality. IMPRESSION: Marked improvement in position and alignment of medial and lateral malleolar fractures. No new abnormality. Electronically Signed   By: Inge Rise  M.D.   On: 04/27/2020 15:24   DG Ankle Complete Left  Result Date: 04/27/2020 CLINICAL DATA:  The patient suffered a left ankle injury in a fall today. Initial encounter. EXAM: LEFT ANKLE COMPLETE - 3+ VIEW COMPARISON:  None. FINDINGS: The patient has acute medial and lateral malleolar fractures. Both fractures are laterally displaced approximately 1 shaft width. The talus is laterally subluxed 1.1 cm. No other acute bony or joint abnormality is identified. There is soft tissue swelling about the patient's fractures. Pins for hallux valgus repair in the distal first metatarsal noted. IMPRESSION: Acute, displaced medial and lateral malleolar fractures as described. Electronically Signed   By: Inge Rise M.D.   On: 04/27/2020 14:11   DG Chest Port 1 View  Result Date: 04/30/2020 CLINICAL DATA:  Cough and shortness of breath. Recent COVID diagnosis. EXAM: PORTABLE CHEST 1 VIEW COMPARISON:  Chest x-ray dated May 15, 2016. FINDINGS: The heart size and mediastinal contours are within normal limits. Streaky linear and reticulonodular interstitial thickening and opacity at both lung bases. No focal consolidation, pleural effusion, or pneumothorax. No acute osseous abnormality. IMPRESSION: 1. Bilateral lower lobe interstitial and airspace disease, consistent with atypical infection/viral pneumonia. Electronically Signed   By: Gwyndolyn Saxon  Marzella Schlein M.D.   On: 04/30/2020 12:23      Phillips Climes M.D on 05/07/2020 at 11:05 AM    [facility] -  Office  [phone]

## 2020-05-07 NOTE — Progress Notes (Signed)
  Echocardiogram 2D Echocardiogram has been performed.  Jennette Dubin 05/07/2020, 1:14 PM

## 2020-05-07 NOTE — Plan of Care (Signed)

## 2020-05-08 ENCOUNTER — Inpatient Hospital Stay (HOSPITAL_COMMUNITY): Payer: BC Managed Care – PPO

## 2020-05-08 LAB — TYPE AND SCREEN
ABO/RH(D): O POS
Antibody Screen: NEGATIVE

## 2020-05-08 LAB — D-DIMER, QUANTITATIVE: D-Dimer, Quant: 1 ug/mL-FEU — ABNORMAL HIGH (ref 0.00–0.50)

## 2020-05-08 LAB — PROTIME-INR
INR: 1 (ref 0.8–1.2)
Prothrombin Time: 12.9 seconds (ref 11.4–15.2)

## 2020-05-08 MED ORDER — DIPHENHYDRAMINE HCL 25 MG PO CAPS
25.0000 mg | ORAL_CAPSULE | Freq: Once | ORAL | Status: AC
  Administered 2020-05-08: 25 mg via ORAL
  Filled 2020-05-08: qty 1

## 2020-05-08 MED ORDER — METHYLPREDNISOLONE SODIUM SUCC 125 MG IJ SOLR
60.0000 mg | Freq: Every day | INTRAMUSCULAR | Status: DC
  Administered 2020-05-09 – 2020-05-11 (×3): 60 mg via INTRAVENOUS
  Filled 2020-05-08 (×3): qty 2

## 2020-05-08 NOTE — Progress Notes (Signed)
PROGRESS NOTE                                                                                                                                                                                                             Patient Demographics:    Debbie Allen, is a 54 y.o. female, DOB - [birth year], LNL:[id number]  Outpatient Primary MD for the patient is Patient, No Pcp Per    LOS - 8  Admit date - 04/30/2020    Chief Complaint  Patient presents with  . Shortness of Breath       Brief Narrative - Debbie Allen Smallis a 54 y.o.femalewith medical history significant ofparoxysmal supraventricular tachycardia, acid reflux, hyperlipidemia presents to emergency department with worsening cough and shortness of breath.  She came to the hospital after she incurred a syncopal episode and injured her left ankle, and the hospital she was diagnosed with left ankle fracture, of note she was recently diagnosed with COVID-19 infection prior to her hospitalization on 04/22/2020 at PCP office.  She was also developing some shortness of breath along with generalized weakness, this most likely caused her syncopal episode.  He was admitted for acute hypoxic respiratory failure due to COVID-19 pneumonia along with syncope causing fall and left ankle fracture.    Subjective:    Debbie Allen today has, No headache, No chest pain, No abdominal pain - No Nausea, No new weakness tingling or numbness, no Cough - SOB.     Assessment  & Plan :     1. Acute Hypoxic Resp. Failure due to Acute Covid 19 Viral Pneumonitis during the ongoing 2020 Covid 19 Pandemic - she moderate disease and was initially on 15 L nasal cannula oxygen, she has been treated with combination of IV steroids, remdesivir and Baricitinib, she has shown considerable improvement and now at rest she is symptom-free and on room air.  Continue monitoring inflammatory markers.  Encouraged the patient  to sit up in chair in the daytime use I-Allen and flutter valve for pulmonary toiletry and then prone in bed when at night.  Will advance activity and titrate down oxygen as possible.    Recent Labs  Lab 05/03/20 0250 05/04/20 0109 05/05/20 0301 05/06/20 0309 05/07/20 0434  WBC 11.4* 14.1* 13.7* 11.2* 9.9  PLT 337 329 349 356 394  CRP  --   --   --  17.1* 13.1*  DDIMER 1.21* 1.54* 0.93* 0.74* 0.83*  AST 83* 87* 38 36 59*  ALT 56* 88* 59* 52* 70*  ALKPHOS 73 74 83 85 101  BILITOT 0.5 0.5 0.5 0.6 0.5  ALBUMIN 3.0* 2.9* 2.6* 2.6* 2.5*    2.  Decreased oral intake, generalized weakness, hypotension and syncope.  All due to COVID-19 infection, blood pressure is improved after hydration.  Stable echocardiogram which appears nonacute.  3.  Fall with left ankle fracture.  Reduced and splinted in the ER on 04/27/2020, she has lateral and medial malleolus fractures.  Orthopedic on board.  I have communicated to orthopedic surgery the patient is okay to proceed for surgery from medical standpoint.  She is mild to moderate risk for adverse cardiopulmonary outcome, she and her husband know this and want to proceed with surgery.  4.  Mild transaminitis.  Asymptomatic due to COVID-19 viral infection.  Monitor trend.  5.  PSVT.  Due to dehydration, currently on low-dose metoprolol continue.  Has been adequately hydrated.  6.  Mild intermittent asthma.  Stable continue supportive care.  No wheezing.  7.  Dyslipidemia.  On statin.   Condition - Fair  Family Communication  :  Husband Debbie Allen [phone] on 05/08/2020  Code Status :  Full  Consults  :  Ortho  Procedures  :    TTE - 1. Left ventricular ejection fraction, by estimation, is 55 to 60%. The left ventricle has normal function. The left ventricle has no regional wall motion abnormalities. Left ventricular diastolic parameters are consistent with Grade II diastolic dysfunction (pseudonormalization).  2. Right ventricular systolic function is  normal. The right ventricular size is normal. There is normal pulmonary artery systolic pressure. The estimated right ventricular systolic pressure is 01.7 mmHg.  3. The mitral valve is normal in structure. Trivial mitral valve regurgitation. No evidence of mitral stenosis.  4. The aortic valve is tricuspid. Aortic valve regurgitation is mild. No aortic stenosis is present.  5. The inferior vena cava is normal in size with greater than 50% respiratory variability, suggesting right atrial pressure of 3 mmHg.  PUD Prophylaxis : PPI  Disposition Plan  :    Status is: Inpatient  Remains inpatient appropriate because:IV treatments appropriate due to intensity of illness or inability to take PO   Dispo: The patient is from: Home              Anticipated d/c is to: Home              Anticipated d/c date is: > 3 days              Patient currently is not medically stable to d/c.  DVT Prophylaxis  :  Lovenox   Lab Results  Component Value Date   PLT 394 05/07/2020    Diet :  Diet Order            Diet regular Room service appropriate? Yes; Fluid consistency: Thin  Diet effective now                  Inpatient Medications  Scheduled Meds: . AeroChamber Plus Flo-Vu Medium  1 each Other Once  . aspirin EC  81 mg Oral Daily  . baricitinib  4 mg Oral Daily  . enoxaparin (LOVENOX) injection  40 mg Subcutaneous Q24H  . feeding supplement (ENSURE ENLIVE)  237 mL Oral BID BM  . [START ON 05/09/2020] methylPREDNISolone (SOLU-MEDROL) injection  60 mg Intravenous Daily  .  metoprolol tartrate  12.5 mg Oral BID  . midodrine  2.5 mg Oral BID WC  . mometasone-formoterol  2 puff Inhalation BID  . montelukast  10 mg Oral Daily  . pantoprazole  40 mg Oral Daily  . rosuvastatin  20 mg Oral Daily  . senna  2 tablet Oral Daily   Continuous Infusions: PRN Meds:.acetaminophen, albuterol, alum & mag hydroxide-simeth, bisacodyl, guaiFENesin-dextromethorphan, menthol-cetylpyridinium, [DISCONTINUED]  ondansetron **OR** ondansetron (ZOFRAN) IV, polyethylene glycol, sodium chloride, sodium phosphate  Antibiotics  :    Anti-infectives (From admission, onward)   Start     Dose/Rate Route Frequency Ordered Stop   05/01/20 1000  remdesivir 100 mg in sodium chloride 0.9 % 100 mL IVPB       "Followed by" Linked Group Details   100 mg 200 mL/hr over 30 Minutes Intravenous Daily 04/30/20 1444 05/04/20 0944   04/30/20 1600  remdesivir 200 mg in sodium chloride 0.9% 250 mL IVPB       "Followed by" Linked Group Details   200 mg 580 mL/hr over 30 Minutes Intravenous Once 04/30/20 1444 04/30/20 2110       Time Spent in minutes  30   Lala Lund M.D on 05/08/2020 at 11:08 AM  To page go to [email or web address] - password [facility]  [facility] -  Office  [phone]    See all Orders from today for further details    Objective:   Vitals:   05/07/20 1551 05/07/20 1703 05/08/20 0016 05/08/20 0746  BP:  102/81 120/79 120/86  Pulse:  90 90 89  Resp:  20 18 19   Temp:  98.6 F (37 C) 97.9 F (36.6 C) 98.3 F (36.8 C)  TempSrc:   Axillary   SpO2: 97% 96% 91% (!) 89%  Weight:      Height:        Wt Readings from Last 3 Encounters:  05/01/20 80.7 kg  04/27/20 80.7 kg  12/06/19 82.6 kg     Intake/Output Summary (Last 24 hours) at 05/08/2020 1108 Last data filed at 05/07/2020 2015 Gross per 24 hour  Intake 240 ml  Output 0 ml  Net 240 ml     Physical Exam  Awake Alert, No new F.N deficits, Normal affect [place].AT,PERRAL Supple Neck,No JVD, No cervical lymphadenopathy appriciated.  Symmetrical Chest wall movement, Good air movement bilaterally, CTAB RRR,No Gallops,Rubs or new Murmurs, No Parasternal Heave +ve B.Sounds, Abd Soft, No tenderness, No organomegaly appriciated, No rebound - guarding or rigidity. No Cyanosis, left ankle under bandage    Data Review:    CBC Recent Labs  Lab 05/02/20 0438 05/02/20 0438 05/03/20 0250 05/04/20 0109 05/05/20 0301 05/06/20 0309  05/07/20 0434  WBC 9.6   < > 11.4* 14.1* 13.7* 11.2* 9.9  HGB 11.6*   < > 11.4* 11.5* 11.1* 11.4* 10.7*  HCT 36.7   < > 36.1 36.0 34.5* 35.5* 33.4*  PLT 311   < > 337 329 349 356 394  MCV 88.0   < > 87.6 85.1 85.0 86.4 86.1  MCH 27.8   < > 27.7 27.2 27.3 27.7 27.6  MCHC 31.6   < > 31.6 31.9 32.2 32.1 32.0  RDW 13.7   < > 13.4 13.3 13.2 13.3 13.2  LYMPHSABS 1.3  --  1.2 1.7 1.0  --   --   MONOABS 0.5  --  0.7 1.1* 0.8  --   --   EOSABS 0.0  --  0.0 0.0 0.1  --   --  BASOSABS 0.1  --  0.1 0.0 0.0  --   --    < > = values in this interval not displayed.    Recent Labs  Lab 05/02/20 0438 05/02/20 0438 05/03/20 0250 05/04/20 0109 05/05/20 0301 05/06/20 0309 05/07/20 0434  NA 139   < > 141 137 136 137 134*  K 4.3   < > 4.5 3.7 3.4* 3.8 3.6  CL 103   < > 103 101 99 102 98  CO2 24   < > 26 26 26 24 25   GLUCOSE 127*   < > 125* 91 117* 112* 114*  BUN 23*   < > 22* 24* 14 15 11   CREATININE 0.95   < > 0.89 0.85 0.60 0.72 0.67  CALCIUM 8.5*   < > 8.8* 8.4* 8.3* 8.2* 8.2*  AST 46*   < > 83* 87* 38 36 59*  ALT 30   < > 56* 88* 59* 52* 70*  ALKPHOS 69   < > 73 74 83 85 101  BILITOT 0.3   < > 0.5 0.5 0.5 0.6 0.5  ALBUMIN 2.8*   < > 3.0* 2.9* 2.6* 2.6* 2.5*  MG 2.2  --  2.3 2.0 2.1  --   --   CRP  --   --   --   --   --  17.1* 13.1*  DDIMER 1.56*   < > 1.21* 1.54* 0.93* 0.74* 0.83*   < > = values in this interval not displayed.    ------------------------------------------------------------------------------------------------------------------ No results for input(Allen): CHOL, HDL, LDLCALC, TRIG, CHOLHDL, LDLDIRECT in the last 72 hours.  No results found for: HGBA1C ------------------------------------------------------------------------------------------------------------------ No results for input(Allen): TSH, T4TOTAL, T3FREE, THYROIDAB in the last 72 hours.  Invalid input(Allen): FREET3  Cardiac Enzymes No results for input(Allen): CKMB, TROPONINI, MYOGLOBIN in the last 168  hours.  Invalid input(Allen): CK ------------------------------------------------------------------------------------------------------------------    Component Value Date/Time   BNP 28.7 04/30/2020 1730    Micro Results Recent Results (from the past 240 hour(Allen))  Blood Culture (routine x 2)     Status: None   Collection Time: 04/30/20 12:30 PM   Specimen: BLOOD RIGHT ARM  Result Value Ref Range Status   Specimen Description BLOOD RIGHT ARM  Final   Special Requests   Final    BOTTLES DRAWN AEROBIC AND ANAEROBIC Blood Culture adequate volume   Culture   Final    NO GROWTH 5 DAYS Performed at [facility], [address] [place], [place] [address]    Report Status 05/05/2020 FINAL  Final  SARS Coronavirus 2 by RT PCR (hospital order, performed in [facility] hospital lab) Nasopharyngeal Nasopharyngeal Swab     Status: Abnormal   Collection Time: 04/30/20 12:40 PM   Specimen: Nasopharyngeal Swab  Result Value Ref Range Status   SARS Coronavirus 2 POSITIVE (A) NEGATIVE Final    Comment: (NOTE) SARS-CoV-2 target nucleic acids are DETECTED  SARS-CoV-2 RNA is generally detectable in upper respiratory specimens  during the acute phase of infection.  Positive results are indicative  of the presence of the identified virus, but do not rule out bacterial infection or co-infection with other pathogens not detected by the test.  Clinical correlation with patient history and  other diagnostic information is necessary to determine patient infection status.  The expected result is negative.  Fact Sheet for Patients:   StrictlyIdeas.no   Fact Sheet for Healthcare Providers:   BankingDealers.co.za    This test is not yet  approved or cleared by the [place] and  has been authorized for detection and/or diagnosis of SARS-CoV-2 by FDA under an Emergency Use Authorization (EUA).  This EUA will remain in effect (meaning this test can  be used) for the duration of  the COVID-19 declaration under Section 564(b)(1)  of the Act, 21 U.[place].C. section 360-bbb-3(b)(1), unless the authorization is terminated or revoked sooner.  Performed at [facility], [place] [address], [place], [place] [address]   Blood Culture (routine x 2)     Status: None   Collection Time: 04/30/20  2:10 PM   Specimen: BLOOD RIGHT FOREARM  Result Value Ref Range Status   Specimen Description BLOOD RIGHT FOREARM  Final   Special Requests   Final    BOTTLES DRAWN AEROBIC AND ANAEROBIC Blood Culture results may not be optimal due to an inadequate volume of blood received in culture bottles   Culture   Final    NO GROWTH 5 DAYS Performed at [facility], [place] [address] [place], [place] [address]    Report Status 05/05/2020 FINAL  Final    Radiology Reports DG Ankle Complete Left  Result Date: 04/27/2020 CLINICAL DATA:  The patient suffered left medial and lateral malleolar fractures in a fall today. Post reduction imaging. Initial encounter. EXAM: LEFT ANKLE COMPLETE - 3+ VIEW COMPARISON:  Plain films of the left ankle earlier today. FINDINGS: The patient is now in a fiberglass cast. Position and alignment of the patient'Allen medial and lateral malleolar fractures are markedly improved. No new abnormality. IMPRESSION: Marked improvement in position and alignment of medial and lateral malleolar fractures. No new abnormality. Electronically Signed   By: Inge Rise M.D.   On: 04/27/2020 15:24   DG Ankle Complete Left  Result Date: 04/27/2020 CLINICAL DATA:  The patient suffered a left ankle injury in a fall today. Initial encounter. EXAM: LEFT ANKLE COMPLETE - 3+ VIEW COMPARISON:  None. FINDINGS: The patient has acute medial and lateral malleolar fractures. Both fractures are laterally displaced approximately 1 shaft width. The talus is laterally subluxed 1.1 cm. No other acute bony or joint abnormality is identified. There is soft tissue swelling  about the patient'Allen fractures. Pins for hallux valgus repair in the distal first metatarsal noted. IMPRESSION: Acute, displaced medial and lateral malleolar fractures as described. Electronically Signed   By: Inge Rise M.D.   On: 04/27/2020 14:11   DG Chest Port 1 View  Result Date: 04/30/2020 CLINICAL DATA:  Cough and shortness of breath. Recent COVID diagnosis. EXAM: PORTABLE CHEST 1 VIEW COMPARISON:  Chest x-ray dated May 15, 2016. FINDINGS: The heart size and mediastinal contours are within normal limits. Streaky linear and reticulonodular interstitial thickening and opacity at both lung bases. No focal consolidation, pleural effusion, or pneumothorax. No acute osseous abnormality. IMPRESSION: 1. Bilateral lower lobe interstitial and airspace disease, consistent with atypical infection/viral pneumonia. Electronically Signed   By: Titus Dubin M.D.   On: 04/30/2020 12:23   ECHOCARDIOGRAM LIMITED  Result Date: 05/07/2020    ECHOCARDIOGRAM LIMITED REPORT   Patient Name:   Hadlie Allen Hellard Date of Exam: 05/07/2020 Medical Rec #:  [id number]   Height:       62.0 in Accession #:    [id number]  Weight:       178.0 lb Date of Birth:  08/04/1966  BSA:          1.819 m Patient Age:    34 years    BP:  98/73 mmHg Patient Gender: F           HR:           69 bpm. Exam Location:  Inpatient Procedure: Limited Echo, Limited Color Doppler and Cardiac Doppler Indications:    Hypotension  History:        Patient has prior history of Echocardiogram examinations, most                 recent 07/05/2015. Covid-19 Positive; Aortic Valve Disease.  Sonographer:    Mikki Santee RDCS (AE) Referring Phys: [id number] Allen [place] IMPRESSIONS  1. Left ventricular ejection fraction, by estimation, is 55 to 60%. The left ventricle has normal function. The left ventricle has no regional wall motion abnormalities. Left ventricular diastolic parameters are consistent with Grade II diastolic dysfunction  (pseudonormalization).  2. Right ventricular systolic function is normal. The right ventricular size is normal. There is normal pulmonary artery systolic pressure. The estimated right ventricular systolic pressure is 29.9 mmHg.  3. The mitral valve is normal in structure. Trivial mitral valve regurgitation. No evidence of mitral stenosis.  4. The aortic valve is tricuspid. Aortic valve regurgitation is mild. No aortic stenosis is present.  5. The inferior vena cava is normal in size with greater than 50% respiratory variability, suggesting right atrial pressure of 3 mmHg. FINDINGS  Left Ventricle: Left ventricular ejection fraction, by estimation, is 55 to 60%. The left ventricle has normal function. The left ventricle has no regional wall motion abnormalities. The left ventricular internal cavity size was normal in size. There is  no left ventricular hypertrophy. Right Ventricle: The right ventricular size is normal. No increase in right ventricular wall thickness. Right ventricular systolic function is normal. There is normal pulmonary artery systolic pressure. The tricuspid regurgitant velocity is 2.19 m/Allen, and  with an assumed right atrial pressure of 3 mmHg, the estimated right ventricular systolic pressure is 37.1 mmHg. Left Atrium: Left atrial size was normal in size. Right Atrium: Right atrial size was normal in size. Mitral Valve: The mitral valve is normal in structure. Trivial mitral valve regurgitation. No evidence of mitral valve stenosis. Tricuspid Valve: The tricuspid valve is normal in structure. Tricuspid valve regurgitation is trivial. Aortic Valve: The aortic valve is tricuspid. Aortic valve regurgitation is mild. Aortic regurgitation PHT measures 750 msec. No aortic stenosis is present. Aorta: The aortic root is normal in size and structure. Venous: The inferior vena cava is normal in size with greater than 50% respiratory variability, suggesting right atrial pressure of 3 mmHg. IAS/Shunts: No  atrial level shunt detected by color flow Doppler. LEFT VENTRICLE PLAX 2D LVIDd:         4.43 cm  Diastology LVIDs:         3.30 cm  LV e' lateral:   7.29 cm/Allen LV PW:         0.90 cm  LV E/e' lateral: 11.6 LV IVS:        0.86 cm  LV e' medial:    6.64 cm/Allen LVOT diam:     1.80 cm  LV E/e' medial:  12.8 LVOT Area:     2.54 cm  LEFT ATRIUM             Index       RIGHT ATRIUM           Index LA diam:        3.40 cm 1.87 cm/m  RA Area:     14.20 cm  LA Vol (A2C):   46.3 ml 25.45 ml/m RA Volume:   32.50 ml  17.86 ml/m LA Vol (A4C):   39.1 ml 21.49 ml/m LA Biplane Vol: 47.0 ml 25.83 ml/m  AORTIC VALVE AI PHT:      750 msec  AORTA Ao Root diam: 3.00 cm MITRAL VALVE               TRICUSPID VALVE MV Area (PHT): 2.91 cm    TR Peak grad:   19.2 mmHg MV Decel Time: 261 msec    TR Vmax:        219.00 cm/Allen MV E velocity: 84.80 cm/Allen MV A velocity: 80.60 cm/Allen  SHUNTS MV E/A ratio:  1.05        Systemic Diam: 1.80 cm Loralie Champagne MD Electronically signed by Loralie Champagne MD Signature Date/Time: 05/07/2020/4:46:48 PM    Final

## 2020-05-08 NOTE — Progress Notes (Signed)
Nutrition Follow Up  DOCUMENTATION CODES:   Not applicable  INTERVENTION:    Ensure Enlive po BID, each supplement provides 350 kcal and 20 grams of protein  MVI daily   NUTRITION DIAGNOSIS:   Increased nutrient needs related to acute illness as evidenced by estimated needs.  Ongoing  GOAL:   Patient will meet greater than or equal to 90% of their needs   Progressing  MONITOR:   PO intake, Supplement acceptance, Weight trends, Labs, I & O's  REASON FOR ASSESSMENT:   Malnutrition Screening Tool    ASSESSMENT:   Patient with PMH significant for acid reflux, aortic regurgitation, benign brain tumor, PSVT, and asthma. Presents this admission with COVID-19 PNA.   Appetite progressing. Last meal completion charted as 100%. Drinking Ensure BID. Unable to obtain further information from patient. Continue current interventions.   Admission weight: 80.7 kg   Medications: solumedrol, senokot Labs: Na 134 (L) CBG 112-117  Diet Order:   Diet Order            Diet regular Room service appropriate? Yes; Fluid consistency: Thin  Diet effective now                 EDUCATION NEEDS:   Not appropriate for education at this time  Skin:  Skin Assessment: Reviewed RN Assessment  Last BM:  8/31  Height:   Ht Readings from Last 1 Encounters:  05/01/20 5\' 2"  (1.575 m)    Weight:   Wt Readings from Last 1 Encounters:  05/01/20 80.7 kg    BMI:  Body mass index is 32.56 kg/m.  Estimated Nutritional Needs:   Kcal:  2100-2300 kcal  Protein:  105-125 grams  Fluid:  >/= 2.1 L/day   Mariana Single RD, LDN Clinical Nutrition Pager listed in [place]

## 2020-05-09 LAB — COMPREHENSIVE METABOLIC PANEL
ALT: 279 U/L — ABNORMAL HIGH (ref 0–44)
AST: 175 U/L — ABNORMAL HIGH (ref 15–41)
Albumin: 3 g/dL — ABNORMAL LOW (ref 3.5–5.0)
Alkaline Phosphatase: 189 U/L — ABNORMAL HIGH (ref 38–126)
Anion gap: 14 (ref 5–15)
BUN: 19 mg/dL (ref 6–20)
CO2: 22 mmol/L (ref 22–32)
Calcium: 9 mg/dL (ref 8.9–10.3)
Chloride: 105 mmol/L (ref 98–111)
Creatinine, Ser: 0.78 mg/dL (ref 0.44–1.00)
GFR calc Af Amer: >60 mL/min (ref >60)
GFR calc non Af Amer: >60 mL/min (ref >60)
Glucose, Bld: 115 mg/dL — ABNORMAL HIGH (ref 70–99)
Potassium: 3.7 mmol/L (ref 3.5–5.1)
Sodium: 141 mmol/L (ref 135–145)
Total Bilirubin: 0.7 mg/dL (ref 0.3–1.2)
Total Protein: 6.8 g/dL (ref 6.5–8.1)

## 2020-05-09 LAB — CBC
HCT: 36.4 % (ref 36.0–46.0)
Hemoglobin: 11.6 g/dL — ABNORMAL LOW (ref 12.0–15.0)
MCH: 27.6 pg (ref 26.0–34.0)
MCHC: 31.9 g/dL (ref 30.0–36.0)
MCV: 86.7 fL (ref 80.0–100.0)
Platelets: 593 10*3/uL — ABNORMAL HIGH (ref 150–400)
RBC: 4.2 MIL/uL (ref 3.87–5.11)
RDW: 13.4 % (ref 11.5–15.5)
WBC: 10.7 10*3/uL — ABNORMAL HIGH (ref 4.0–10.5)
nRBC: 0 % (ref 0.0–0.2)

## 2020-05-09 LAB — C-REACTIVE PROTEIN: CRP: 3.3 mg/dL — ABNORMAL HIGH (ref <1.0)

## 2020-05-09 LAB — BRAIN NATRIURETIC PEPTIDE: B Natriuretic Peptide: 76.7 pg/mL (ref 0.0–100.0)

## 2020-05-09 LAB — MAGNESIUM: Magnesium: 2.3 mg/dL (ref 1.7–2.4)

## 2020-05-09 LAB — D-DIMER, QUANTITATIVE: D-Dimer, Quant: 0.92 ug/mL-FEU — ABNORMAL HIGH (ref 0.00–0.50)

## 2020-05-09 MED ORDER — POTASSIUM CHLORIDE CRYS ER 20 MEQ PO TBCR
20.0000 meq | EXTENDED_RELEASE_TABLET | Freq: Once | ORAL | Status: AC
  Administered 2020-05-09: 20 meq via ORAL
  Filled 2020-05-09: qty 1

## 2020-05-09 MED ORDER — FUROSEMIDE 40 MG PO TABS
40.0000 mg | ORAL_TABLET | Freq: Once | ORAL | Status: AC
  Administered 2020-05-09: 40 mg via ORAL
  Filled 2020-05-09: qty 1

## 2020-05-09 NOTE — Plan of Care (Signed)

## 2020-05-09 NOTE — Progress Notes (Signed)
Our plan will be for definitive treatment of this left ankle fracture on Saturday, September 4.  This is due to some scheduling issues.  We will plan to do that in the morning.  I will come by to see the patient tomorrow on September 3.

## 2020-05-09 NOTE — Progress Notes (Signed)
PROGRESS NOTE                                                                                                                                                                                                             Patient Demographics:    Debbie [place], is a 54 y.o. female, DOB - August 04, 1966, HER:[id number]  Outpatient Primary MD for the patient is Patient, No Pcp Per    LOS - 9  Admit date - 04/30/2020    Chief Complaint  Patient presents with  . Shortness of Breath       Brief Narrative - Debbie Allen a 54 y.o.femalewith medical history significant ofparoxysmal supraventricular tachycardia, acid reflux, hyperlipidemia presents to emergency department with worsening cough and shortness of breath.  She came to the hospital after she incurred a syncopal episode and injured her left ankle, and the hospital she was diagnosed with left ankle fracture, of note she was recently diagnosed with COVID-19 infection prior to her hospitalization on 04/22/2020 at PCP office.  She was also developing some shortness of breath along with generalized weakness, this most likely caused her syncopal episode.  He was admitted for acute hypoxic respiratory failure due to COVID-19 pneumonia along with syncope causing fall and left ankle fracture.    Subjective:   Patient in bed, appears comfortable, denies any headache, no fever, no chest pain or pressure, no shortness of breath , no abdominal pain. No focal weakness.   Assessment  & Plan :     1. Acute Hypoxic Resp. Failure due to Acute Covid 19 Viral Pneumonitis during the ongoing 2020 Covid 19 Pandemic - she moderate disease and was initially on 15 L nasal cannula oxygen, she has been treated with combination of IV steroids, remdesivir and Baricitinib, she has shown considerable improvement and now at rest she is symptom-free and on room air. Will continue monitoring inflammatory  markers.  Encouraged the patient to sit up in chair in the daytime use I-S and flutter valve for pulmonary toiletry and then prone in bed when at night.  Will advance activity and titrate down oxygen as possible.    Recent Labs  Lab 05/03/20 0250 05/03/20 0250 05/04/20 0109 05/05/20 0301 05/06/20 0309 05/07/20 0434 05/08/20 1029  WBC 11.4*  --  14.1* 13.7* 11.2* 9.9  --   PLT 337  --  329 349 356 394  --   CRP  --   --   --   --  17.1* 13.1*  --   DDIMER 1.21*   < > 1.54* 0.93* 0.74* 0.83* 1.00*  AST 83*  --  87* 38 36 59*  --   ALT 56*  --  88* 59* 52* 70*  --   ALKPHOS 73  --  74 83 85 101  --   BILITOT 0.5  --  0.5 0.5 0.6 0.5  --   ALBUMIN 3.0*  --  2.9* 2.6* 2.6* 2.5*  --   INR  --   --   --   --   --   --  1.0   < > = values in this interval not displayed.    2.  Decreased oral intake, generalized weakness, hypotension and syncope.  All due to COVID-19 infection, blood pressure is improved after hydration.  Stable echocardiogram which appears nonacute.  3.  Fall with left ankle fracture.  Reduced and splinted in the ER on 04/27/2020, she has lateral and medial malleolus fractures.  Orthopedic on board.  I have communicated to orthopedic surgery the patient is okay to proceed for surgery from medical standpoint on 05/10/20.  She is mild to moderate risk for adverse cardiopulmonary outcome, she and her husband know this and want to proceed with surgery.  4.  Mild transaminitis.  Asymptomatic due to COVID-19 viral infection.  Monitor trend.  5.  PSVT.  Due to dehydration, currently on low-dose metoprolol continue.  Has been adequately hydrated.  6.  Mild intermittent asthma.  Stable continue supportive care.  No wheezing.  7.  Dyslipidemia.  On statin.   Condition - Fair  Family Communication  :  Husband Merry Proud [phone] on 05/08/2020  Code Status :  Full  Consults  :  Ortho  Procedures  :    TTE - 1. Left ventricular ejection fraction, by estimation, is 55 to 60%.  The left ventricle has normal function. The left ventricle has no regional wall motion abnormalities. Left ventricular diastolic parameters are consistent with Grade II diastolic dysfunction (pseudonormalization).  2. Right ventricular systolic function is normal. The right ventricular size is normal. There is normal pulmonary artery systolic pressure. The estimated right ventricular systolic pressure is 75.9 mmHg.  3. The mitral valve is normal in structure. Trivial mitral valve regurgitation. No evidence of mitral stenosis.  4. The aortic valve is tricuspid. Aortic valve regurgitation is mild. No aortic stenosis is present.  5. The inferior vena cava is normal in size with greater than 50% respiratory variability, suggesting right atrial pressure of 3 mmHg.  PUD Prophylaxis : PPI  Disposition Plan  :    Status is: Inpatient  Remains inpatient appropriate because:IV treatments appropriate due to intensity of illness or inability to take PO   Dispo: The patient is from: Home              Anticipated d/c is to: Home              Anticipated d/c date is: > 3 days              Patient currently is not medically stable to d/c.  DVT Prophylaxis  :  Lovenox   Lab Results  Component Value Date   PLT 394 05/07/2020    Diet :  Diet Order            Diet regular Room service appropriate? Yes;  Fluid consistency: Thin  Diet effective now                  Inpatient Medications  Scheduled Meds: . AeroChamber Plus Flo-Vu Medium  1 each Other Once  . aspirin EC  81 mg Oral Daily  . baricitinib  4 mg Oral Daily  . enoxaparin (LOVENOX) injection  40 mg Subcutaneous Q24H  . feeding supplement (ENSURE ENLIVE)  237 mL Oral BID BM  . methylPREDNISolone (SOLU-MEDROL) injection  60 mg Intravenous Daily  . metoprolol tartrate  12.5 mg Oral BID  . midodrine  2.5 mg Oral BID WC  . mometasone-formoterol  2 puff Inhalation BID  . montelukast  10 mg Oral Daily  . pantoprazole  40 mg Oral Daily  .  rosuvastatin  20 mg Oral Daily  . senna  2 tablet Oral Daily   Continuous Infusions: PRN Meds:.acetaminophen, albuterol, alum & mag hydroxide-simeth, bisacodyl, guaiFENesin-dextromethorphan, menthol-cetylpyridinium, [DISCONTINUED] ondansetron **OR** ondansetron (ZOFRAN) IV, polyethylene glycol, sodium chloride, sodium phosphate  Antibiotics  :    Anti-infectives (From admission, onward)   Start     Dose/Rate Route Frequency Ordered Stop   05/01/20 1000  remdesivir 100 mg in sodium chloride 0.9 % 100 mL IVPB       "Followed by" Linked Group Details   100 mg 200 mL/hr over 30 Minutes Intravenous Daily 04/30/20 1444 05/04/20 0944   04/30/20 1600  remdesivir 200 mg in sodium chloride 0.9% 250 mL IVPB       "Followed by" Linked Group Details   200 mg 580 mL/hr over 30 Minutes Intravenous Once 04/30/20 1444 04/30/20 2110       Time Spent in minutes  30   Lala Lund M.D on 05/09/2020 at 11:40 AM  To page go to [email or web address] - password [facility]  [facility] -  Office  [phone]    See all Orders from today for further details    Objective:   Vitals:   05/08/20 0746 05/08/20 1701 05/08/20 2118 05/09/20 0750  BP: 120/86 114/76 128/79 123/77  Pulse: 89 89 98 62  Resp: 19 19 18 19   Temp: 98.3 F (36.8 C) 98.4 F (36.9 C) 98 F (36.7 C) 98.1 F (36.7 C)  TempSrc:   Oral   SpO2: (!) 89% 96% 95% 92%  Weight:      Height:        Wt Readings from Last 3 Encounters:  05/01/20 80.7 kg  04/27/20 80.7 kg  12/06/19 82.6 kg     Intake/Output Summary (Last 24 hours) at 05/09/2020 1140 Last data filed at 05/08/2020 2006 Gross per 24 hour  Intake 720 ml  Output --  Net 720 ml     Physical Exam  Awake Alert, No new F.N deficits, Normal affect [place].AT,PERRAL Supple Neck,No JVD, No cervical lymphadenopathy appriciated.  Symmetrical Chest wall movement, Good air movement bilaterally, CTAB RRR,No Gallops, Rubs or new Murmurs, No Parasternal Heave +ve B.Sounds, Abd Soft,  No tenderness, No organomegaly appriciated, No rebound - guarding or rigidity. No Cyanosis, left ankle under bandage    Data Review:    CBC Recent Labs  Lab 05/03/20 0250 05/04/20 0109 05/05/20 0301 05/06/20 0309 05/07/20 0434  WBC 11.4* 14.1* 13.7* 11.2* 9.9  HGB 11.4* 11.5* 11.1* 11.4* 10.7*  HCT 36.1 36.0 34.5* 35.5* 33.4*  PLT 337 329 349 356 394  MCV 87.6 85.1 85.0 86.4 86.1  MCH 27.7 27.2 27.3 27.7 27.6  MCHC 31.6 31.9 32.2 32.1 32.0  RDW 13.4  13.3 13.2 13.3 13.2  LYMPHSABS 1.2 1.7 1.0  --   --   MONOABS 0.7 1.1* 0.8  --   --   EOSABS 0.0 0.0 0.1  --   --   BASOSABS 0.1 0.0 0.0  --   --     Recent Labs  Lab 05/03/20 0250 05/03/20 0250 05/04/20 0109 05/05/20 0301 05/06/20 0309 05/07/20 0434 05/08/20 1029  NA 141  --  137 136 137 134*  --   K 4.5  --  3.7 3.4* 3.8 3.6  --   CL 103  --  101 99 102 98  --   CO2 26  --  26 26 24 25   --   GLUCOSE 125*  --  91 117* 112* 114*  --   BUN 22*  --  24* 14 15 11   --   CREATININE 0.89  --  0.85 0.60 0.72 0.67  --   CALCIUM 8.8*  --  8.4* 8.3* 8.2* 8.2*  --   AST 83*  --  87* 38 36 59*  --   ALT 56*  --  88* 59* 52* 70*  --   ALKPHOS 73  --  74 83 85 101  --   BILITOT 0.5  --  0.5 0.5 0.6 0.5  --   ALBUMIN 3.0*  --  2.9* 2.6* 2.6* 2.5*  --   MG 2.3  --  2.0 2.1  --   --   --   CRP  --   --   --   --  17.1* 13.1*  --   DDIMER 1.21*   < > 1.54* 0.93* 0.74* 0.83* 1.00*  INR  --   --   --   --   --   --  1.0   < > = values in this interval not displayed.    ------------------------------------------------------------------------------------------------------------------ No results for input(s): CHOL, HDL, LDLCALC, TRIG, CHOLHDL, LDLDIRECT in the last 72 hours.  No results found for: HGBA1C ------------------------------------------------------------------------------------------------------------------ No results for input(s): TSH, T4TOTAL, T3FREE, THYROIDAB in the last 72 hours.  Invalid input(s):  FREET3  Cardiac Enzymes No results for input(s): CKMB, TROPONINI, MYOGLOBIN in the last 168 hours.  Invalid input(s): CK ------------------------------------------------------------------------------------------------------------------    Component Value Date/Time   BNP 28.7 04/30/2020 1730    Micro Results Recent Results (from the past 240 hour(s))  Blood Culture (routine x 2)     Status: None   Collection Time: 04/30/20 12:30 PM   Specimen: BLOOD RIGHT ARM  Result Value Ref Range Status   Specimen Description BLOOD RIGHT ARM  Final   Special Requests   Final    BOTTLES DRAWN AEROBIC AND ANAEROBIC Blood Culture adequate volume   Culture   Final    NO GROWTH 5 DAYS Performed at [facility], [address], [place], [place] [address]    Report Status 05/05/2020 FINAL  Final  SARS Coronavirus 2 by RT PCR (hospital order, performed in [facility] hospital lab) Nasopharyngeal Nasopharyngeal Swab     Status: Abnormal   Collection Time: 04/30/20 12:40 PM   Specimen: Nasopharyngeal Swab  Result Value Ref Range Status   SARS Coronavirus 2 POSITIVE (A) NEGATIVE Final    Comment: (NOTE) SARS-CoV-2 target nucleic acids are DETECTED  SARS-CoV-2 RNA is generally detectable in upper respiratory specimens  during the acute phase of infection.  Positive results are indicative  of the presence of the identified virus, but do not rule out bacterial infection or  co-infection with other pathogens not detected by the test.  Clinical correlation with patient history and  other diagnostic information is necessary to determine patient infection status.  The expected result is negative.  Fact Sheet for Patients:   StrictlyIdeas.no   Fact Sheet for Healthcare Providers:   BankingDealers.co.za    This test is not yet approved or cleared by the [place] FDA and  has been authorized for detection and/or diagnosis of SARS-CoV-2 by FDA  under an Emergency Use Authorization (EUA).  This EUA will remain in effect (meaning this test can be used) for the duration of  the COVID-19 declaration under Section 564(b)(1)  of the Act, 21 U.S.C. section 360-bbb-3(b)(1), unless the authorization is terminated or revoked sooner.  Performed at [facility], [place] [address] [place], [place] [address]   Blood Culture (routine x 2)     Status: None   Collection Time: 04/30/20  2:10 PM   Specimen: BLOOD RIGHT FOREARM  Result Value Ref Range Status   Specimen Description BLOOD RIGHT FOREARM  Final   Special Requests   Final    BOTTLES DRAWN AEROBIC AND ANAEROBIC Blood Culture results may not be optimal due to an inadequate volume of blood received in culture bottles   Culture   Final    NO GROWTH 5 DAYS Performed at [facility], [place] [address], [place], [place] [address]    Report Status 05/05/2020 FINAL  Final    Radiology Reports DG Ankle Complete Left  Result Date: 04/27/2020 CLINICAL DATA:  The patient suffered left medial and lateral malleolar fractures in a fall today. Post reduction imaging. Initial encounter. EXAM: LEFT ANKLE COMPLETE - 3+ VIEW COMPARISON:  Plain films of the left ankle earlier today. FINDINGS: The patient is now in a fiberglass cast. Position and alignment of the patient's medial and lateral malleolar fractures are markedly improved. No new abnormality. IMPRESSION: Marked improvement in position and alignment of medial and lateral malleolar fractures. No new abnormality. Electronically Signed   By: Inge Rise M.D.   On: 04/27/2020 15:24   DG Ankle Complete Left  Result Date: 04/27/2020 CLINICAL DATA:  The patient suffered a left ankle injury in a fall today. Initial encounter. EXAM: LEFT ANKLE COMPLETE - 3+ VIEW COMPARISON:  None. FINDINGS: The patient has acute medial and lateral malleolar fractures. Both fractures are laterally displaced approximately 1 shaft width. The talus is laterally  subluxed 1.1 cm. No other acute bony or joint abnormality is identified. There is soft tissue swelling about the patient's fractures. Pins for hallux valgus repair in the distal first metatarsal noted. IMPRESSION: Acute, displaced medial and lateral malleolar fractures as described. Electronically Signed   By: Inge Rise M.D.   On: 04/27/2020 14:11   DG Chest Port 1 View  Result Date: 05/08/2020 CLINICAL DATA:  Shortness of breath.  Recent COVID diagnosis EXAM: PORTABLE CHEST 1 VIEW COMPARISON:  04/30/2020 FINDINGS: Stable cardiomediastinal contours. No pleural effusion or edema. Interval increase in peripheral interstitial and airspace densities within the right upper lobe and right lower lobe as well as the left midlung and left base. IMPRESSION: Interval increase in bilateral pulmonary opacities compatible with progression of multifocal infection. Electronically Signed   By: Kerby Moors M.D.   On: 05/08/2020 11:44   DG Chest Port 1 View  Result Date: 04/30/2020 CLINICAL DATA:  Cough and shortness of breath. Recent COVID diagnosis. EXAM: PORTABLE CHEST 1 VIEW COMPARISON:  Chest x-ray dated May 15, 2016.  FINDINGS: The heart size and mediastinal contours are within normal limits. Streaky linear and reticulonodular interstitial thickening and opacity at both lung bases. No focal consolidation, pleural effusion, or pneumothorax. No acute osseous abnormality. IMPRESSION: 1. Bilateral lower lobe interstitial and airspace disease, consistent with atypical infection/viral pneumonia. Electronically Signed   By: Titus Dubin M.D.   On: 04/30/2020 12:23   ECHOCARDIOGRAM LIMITED  Result Date: 05/07/2020    ECHOCARDIOGRAM LIMITED REPORT   Patient Name:   Debbie Allen Date of Exam: 05/07/2020 Medical Rec #:  [id number]   Height:       62.0 in Accession #:    [id number]  Weight:       178.0 lb Date of Birth:  [birth year]  BSA:          1.819 m Patient Age:    40 years    BP:           98/73 mmHg Patient  Gender: F           HR:           69 bpm. Exam Location:  Inpatient Procedure: Limited Echo, Limited Color Doppler and Cardiac Doppler Indications:    Hypotension  History:        Patient has prior history of Echocardiogram examinations, most                 recent 07/05/2015. Covid-19 Positive; Aortic Valve Disease.  Sonographer:    Mikki Santee RDCS (AE) Referring Phys: [id number] [place] IMPRESSIONS  1. Left ventricular ejection fraction, by estimation, is 55 to 60%. The left ventricle has normal function. The left ventricle has no regional wall motion abnormalities. Left ventricular diastolic parameters are consistent with Grade II diastolic dysfunction (pseudonormalization).  2. Right ventricular systolic function is normal. The right ventricular size is normal. There is normal pulmonary artery systolic pressure. The estimated right ventricular systolic pressure is 15.4 mmHg.  3. The mitral valve is normal in structure. Trivial mitral valve regurgitation. No evidence of mitral stenosis.  4. The aortic valve is tricuspid. Aortic valve regurgitation is mild. No aortic stenosis is present.  5. The inferior vena cava is normal in size with greater than 50% respiratory variability, suggesting right atrial pressure of 3 mmHg. FINDINGS  Left Ventricle: Left ventricular ejection fraction, by estimation, is 55 to 60%. The left ventricle has normal function. The left ventricle has no regional wall motion abnormalities. The left ventricular internal cavity size was normal in size. There is  no left ventricular hypertrophy. Right Ventricle: The right ventricular size is normal. No increase in right ventricular wall thickness. Right ventricular systolic function is normal. There is normal pulmonary artery systolic pressure. The tricuspid regurgitant velocity is 2.19 m/s, and  with an assumed right atrial pressure of 3 mmHg, the estimated right ventricular systolic pressure is 00.8 mmHg. Left Atrium: Left atrial  size was normal in size. Right Atrium: Right atrial size was normal in size. Mitral Valve: The mitral valve is normal in structure. Trivial mitral valve regurgitation. No evidence of mitral valve stenosis. Tricuspid Valve: The tricuspid valve is normal in structure. Tricuspid valve regurgitation is trivial. Aortic Valve: The aortic valve is tricuspid. Aortic valve regurgitation is mild. Aortic regurgitation PHT measures 750 msec. No aortic stenosis is present. Aorta: The aortic root is normal in size and structure. Venous: The inferior vena cava is normal in size with greater than 50% respiratory variability, suggesting right atrial pressure of 3 mmHg. IAS/Shunts: No  atrial level shunt detected by color flow Doppler. LEFT VENTRICLE PLAX 2D LVIDd:         4.43 cm  Diastology LVIDs:         3.30 cm  LV e' lateral:   7.29 cm/s LV PW:         0.90 cm  LV E/e' lateral: 11.6 LV IVS:        0.86 cm  LV e' medial:    6.64 cm/s LVOT diam:     1.80 cm  LV E/e' medial:  12.8 LVOT Area:     2.54 cm  LEFT ATRIUM             Index       RIGHT ATRIUM           Index LA diam:        3.40 cm 1.87 cm/m  RA Area:     14.20 cm LA Vol (A2C):   46.3 ml 25.45 ml/m RA Volume:   32.50 ml  17.86 ml/m LA Vol (A4C):   39.1 ml 21.49 ml/m LA Biplane Vol: 47.0 ml 25.83 ml/m  AORTIC VALVE AI PHT:      750 msec  AORTA Ao Root diam: 3.00 cm MITRAL VALVE               TRICUSPID VALVE MV Area (PHT): 2.91 cm    TR Peak grad:   19.2 mmHg MV Decel Time: 261 msec    TR Vmax:        219.00 cm/s MV E velocity: 84.80 cm/s MV A velocity: 80.60 cm/s  SHUNTS MV E/A ratio:  1.05        Systemic Diam: 1.80 cm Loralie Champagne MD Electronically signed by Loralie Champagne MD Signature Date/Time: 05/07/2020/4:46:48 PM    Final

## 2020-05-10 LAB — CBC WITH DIFFERENTIAL/PLATELET
Abs Immature Granulocytes: 0.14 10*3/uL — ABNORMAL HIGH (ref 0.00–0.07)
Basophils Absolute: 0 10*3/uL (ref 0.0–0.1)
Basophils Relative: 0 %
Eosinophils Absolute: 0 10*3/uL (ref 0.0–0.5)
Eosinophils Relative: 0 %
HCT: 36.2 % (ref 36.0–46.0)
Hemoglobin: 11.5 g/dL — ABNORMAL LOW (ref 12.0–15.0)
Immature Granulocytes: 2 %
Lymphocytes Relative: 7 %
Lymphs Abs: 0.5 10*3/uL — ABNORMAL LOW (ref 0.7–4.0)
MCH: 27.8 pg (ref 26.0–34.0)
MCHC: 31.8 g/dL (ref 30.0–36.0)
MCV: 87.7 fL (ref 80.0–100.0)
Monocytes Absolute: 0.2 10*3/uL (ref 0.1–1.0)
Monocytes Relative: 3 %
Neutro Abs: 7 10*3/uL (ref 1.7–7.7)
Neutrophils Relative %: 88 %
Platelets: 609 10*3/uL — ABNORMAL HIGH (ref 150–400)
RBC: 4.13 MIL/uL (ref 3.87–5.11)
RDW: 13.3 % (ref 11.5–15.5)
WBC: 8 10*3/uL (ref 4.0–10.5)
nRBC: 0 % (ref 0.0–0.2)

## 2020-05-10 LAB — COMPREHENSIVE METABOLIC PANEL
ALT: 229 U/L — ABNORMAL HIGH (ref 0–44)
AST: 97 U/L — ABNORMAL HIGH (ref 15–41)
Albumin: 3.1 g/dL — ABNORMAL LOW (ref 3.5–5.0)
Alkaline Phosphatase: 178 U/L — ABNORMAL HIGH (ref 38–126)
Anion gap: 13 (ref 5–15)
BUN: 22 mg/dL — ABNORMAL HIGH (ref 6–20)
CO2: 24 mmol/L (ref 22–32)
Calcium: 9 mg/dL (ref 8.9–10.3)
Chloride: 100 mmol/L (ref 98–111)
Creatinine, Ser: 1.05 mg/dL — ABNORMAL HIGH (ref 0.44–1.00)
GFR calc Af Amer: >60 mL/min (ref >60)
GFR calc non Af Amer: >60 mL/min (ref >60)
Glucose, Bld: 118 mg/dL — ABNORMAL HIGH (ref 70–99)
Potassium: 3.8 mmol/L (ref 3.5–5.1)
Sodium: 137 mmol/L (ref 135–145)
Total Bilirubin: 0.6 mg/dL (ref 0.3–1.2)
Total Protein: 6.6 g/dL (ref 6.5–8.1)

## 2020-05-10 LAB — C-REACTIVE PROTEIN: CRP: 2.4 mg/dL — ABNORMAL HIGH (ref <1.0)

## 2020-05-10 LAB — D-DIMER, QUANTITATIVE: D-Dimer, Quant: 1.09 ug/mL-FEU — ABNORMAL HIGH (ref 0.00–0.50)

## 2020-05-10 LAB — BRAIN NATRIURETIC PEPTIDE: B Natriuretic Peptide: 71.3 pg/mL (ref 0.0–100.0)

## 2020-05-10 LAB — MAGNESIUM: Magnesium: 2.2 mg/dL (ref 1.7–2.4)

## 2020-05-10 MED ORDER — DIPHENHYDRAMINE HCL 25 MG PO CAPS
25.0000 mg | ORAL_CAPSULE | Freq: Four times a day (QID) | ORAL | Status: DC | PRN
  Administered 2020-05-11: 25 mg via ORAL
  Filled 2020-05-10: qty 1

## 2020-05-10 NOTE — Consult Note (Signed)
ORTHOPAEDIC CONSULTATION  REQUESTING PHYSICIAN: Thurnell Lose, MD  PCP:  Patient, No Pcp Per  Chief Complaint: Left ankle fracture  HPI: Debbie Allen is a 54 y.o. female who complains of left ankle pain following a fall that occurred actually back on August 21.  She presented to the [facility] emergency department where she was noted to have a bimalleolar ankle fracture with dislocation.  This was closed reduced there.  Unfortunately, she does also have concomitant COVID-19.  This has resulted in some respiratory issues.  She was admitted here at [facility] for high flow oxygen as well as management of the associated symptoms and secondary issues of Covid.  She has now cleared from a medical standpoint for surgical stabilization of the ankle.  She denies numbness or tingling.  She is a Energy manager.  She denies diabetes or smoking.  She is independent with ADLs and lives independently with her husband.  Past Medical History:  Diagnosis Date  . Acid reflux   . Aortic regurgitation    Asymptomatic  . Asthma   . Brain tumor (benign) ([place])   . 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 PSVT (paroxysmal supraventricular tachycardia) ([facility])    Palpitations - suspected diagnosis (LGL postulated by Dr. Debara Pickett but never documented)   Past Surgical History:  Procedure Laterality Date  . COLONOSCOPY  07/2019  . DILATION AND CURETTAGE OF UTERUS    . LEFT HEART CATH     Social History   Socioeconomic History  . Marital status: Married    Spouse name: Not on file  . Number of children: Not on file  . Years of education: Not on file  . Highest education level: Not on file  Occupational History  . Not on file  Tobacco Use  . Smoking status: Never Smoker  . Smokeless tobacco: Never Used  Vaping Use  . Vaping Use: Never used  Substance and Sexual Activity  . Alcohol use: Yes     Alcohol/week: 0.0 standard drinks    Comment: Occasional  . Drug use: No  . Sexual activity: Not on file  Other Topics Concern  . Not on file  Social History Narrative  . Not on file   Social Determinants of Health   Financial Resource Strain:   . Difficulty of Paying Living Expenses: Not on file  Food Insecurity:   . Worried About Charity fundraiser in the Last Year: Not on file  . Ran Out of Food in the Last Year: Not on file  Transportation Needs:   . Lack of Transportation (Medical): Not on file  . Lack of Transportation (Non-Medical): Not on file  Physical Activity:   . Days of Exercise per Week: Not on file  . Minutes of Exercise per Session: Not on file  Stress:   . Feeling of Stress : Not on file  Social Connections:   . Frequency of Communication with Friends and Family: Not on file  . Frequency of Social Gatherings with Friends and Family: Not on file  . Attends Religious Services: Not on file  . Active Member of Clubs or Organizations: Not on file  . Attends Archivist Meetings: Not on file  . Marital Status: Not on file   Family History  Problem Relation Age of Onset  . Heart disease Father 19  . Hyperlipidemia Father   . Hyperlipidemia Sister   .  Hypertension Paternal Grandmother   . Heart disease Paternal Grandfather   . Heart attack Paternal Grandfather    Allergies  Allergen Reactions  . Codeine Itching   Prior to Admission medications   Medication Sig Start Date End Date Taking? Authorizing Provider  albuterol (PROVENTIL HFA;VENTOLIN HFA) 108 (90 BASE) MCG/ACT inhaler Inhale 2 puffs into the lungs every 6 (six) hours as needed for wheezing or shortness of breath.   Yes [provider]  aspirin EC 81 MG tablet Take 1 tablet (81 mg total) by mouth daily. 01/29/17  Yes Dunn, Dayna N, PA-C  azithromycin (ZITHROMAX) 250 MG tablet Take 250-500 mg by mouth See admin instructions. Take 2 tablets for the first day. Then 1 tablet every day  for 4 days.  Patient started on 04-26-20 04/26/20  Yes [provider]  budesonide-formoterol (SYMBICORT) 160-4.5 MCG/ACT inhaler Inhale 2 puffs into the lungs daily.    Yes [provider]  esomeprazole (NEXIUM) 20 MG capsule Take 20 mg by mouth daily at 12 noon.   Yes [provider]  HYDROcodone-acetaminophen (NORCO/VICODIN) 5-325 MG tablet Take 1 tablet by mouth every 4 (four) hours as needed. Patient taking differently: Take 1 tablet by mouth every 4 (four) hours as needed for moderate pain.  04/29/20  Yes Caryl Ada K, PA-C  levocetirizine (XYZAL) 5 MG tablet Take 5 mg by mouth daily. 10/01/13  Yes [provider]  metoprolol tartrate (LOPRESSOR) 25 MG tablet TAKE ONE TABLET BY MOUTH 2 TIMES A DAY. Patient taking differently: Take 25 mg by mouth 2 (two) times daily.  08/01/19  Yes BranchAlphonse Guild, MD  montelukast (SINGULAIR) 10 MG tablet Take 10 mg by mouth daily.  10/01/13  Yes [provider]  Olopatadine HCl 0.6 % SOLN Place into the nose as needed.   Yes [provider]  ondansetron (ZOFRAN ODT) 4 MG disintegrating tablet Take 1 tablet (4 mg total) by mouth every 8 (eight) hours as needed for nausea or vomiting. 04/27/20  Yes Providence Lanius A, PA-C  potassium chloride SA (KLOR-CON) 20 MEQ tablet TAKE 2 TABLETS BY MOUTH DAILY. Patient taking differently: Take 20 mEq by mouth 2 (two) times daily.  02/26/20  Yes Satira Sark, MD  rosuvastatin (CRESTOR) 20 MG tablet Take 1 tablet (20 mg total) by mouth daily. 12/07/19 04/30/20 Yes Satira Sark, MD  verapamil (VERELAN PM) 240 MG 24 hr capsule TAKE ONE CAPSULE BY MOUTH ONCE DAILY. Patient taking differently: Take 240 mg by mouth at bedtime.  08/21/19  Yes Satira Sark, MD  HYDROcodone-acetaminophen (NORCO/VICODIN) 5-325 MG tablet Take 1-2 tablets by mouth every 6 (six) hours as needed. Patient not taking: Reported on 04/30/2020 04/27/20   Providence Lanius A, PA-C   No results  found.  Positive ROS: All other systems have been reviewed and were otherwise negative with the exception of those mentioned in the HPI and as above.  Physical Exam: General: Alert, no acute distress Cardiovascular: No pedal edema Respiratory: No cyanosis, no use of accessory musculature GI: No organomegaly, abdomen is soft and non-tender Skin: No lesions in the area of chief complaint Neurologic: Sensation intact distally Psychiatric: Patient is competent for consent with normal mood and affect Lymphatic: No axillary or cervical lymphadenopathy  MUSCULOSKELETAL:  Left ankle:  Short leg splint in place.  There toes are warm and well-perfused.  No pain with passive stretch.  Active flexion and extension intact.  Sensation intact light touch deep and superficial peroneal nerve.  Assessment:  Left ankle bimalleolar fracture.  Plan: -Laranda and I reviewed the injury.  We discussed that this is an unstable ankle fracture with medial and lateral malleolar involvement.  This does need operative fixation.  My plan will be for surgery tomorrow now that she has stabilized from a medical standpoint.  - The risks, benefits, and alternatives were discussed with the patient. There are risks associated with the surgery including, but not limited to, problems with anesthesia (death), infection, differences in leg length/angulation/rotation, fracture of bones, loosening or failure of implants, malunion, nonunion, hematoma (blood accumulation) which may require surgical drainage, blood clots, pulmonary embolism, nerve injury (foot drop), and blood vessel injury. The patient understands these risks and elects to proceed.  -She will be nonweightbearing postoperatively for approximately 4 weeks.  She will return to the medicine floor postoperatively for the remainder of her stay here.  I will see her back in the office 2 weeks after surgery.  All questions were solicited and answered to her  satisfaction.    Nicholes Stairs, MD Cell [phone]    05/10/2020 6:30 PM

## 2020-05-10 NOTE — Plan of Care (Signed)

## 2020-05-10 NOTE — Progress Notes (Signed)
PROGRESS NOTE                                                                                                                                                                                                             Patient Demographics:    Debbie Allen, is a 54 y.o. female, DOB - [birth year], UUV:[id number]  Outpatient Primary MD for the patient is Patient, No Pcp Per    LOS - 10  Admit date - 04/30/2020    Chief Complaint  Patient presents with  . Shortness of Breath       Brief Narrative - Debbie S Smallis a 54 y.o.femalewith medical history significant ofparoxysmal supraventricular tachycardia, acid reflux, hyperlipidemia presents to emergency department with worsening cough and shortness of breath.  She came to the hospital after she incurred a syncopal episode and injured her left ankle, and the hospital she was diagnosed with left ankle fracture, of note she was recently diagnosed with COVID-19 infection prior to her hospitalization on 04/22/2020 at PCP office.  She was also developing some shortness of breath along with generalized weakness, this most likely caused her syncopal episode.  He was admitted for acute hypoxic respiratory failure due to COVID-19 pneumonia along with syncope causing fall and left ankle fracture.    Subjective:   Patient in bed, appears comfortable, denies any headache, no fever, no chest pain or pressure, no shortness of breath , no abdominal pain. No focal weakness.   Assessment  & Plan :     1. Acute Hypoxic Resp. Failure due to Acute Covid 19 Viral Pneumonitis during the ongoing 2020 Covid 19 Pandemic - she moderate disease and was initially on 15 L nasal cannula oxygen, she has been treated with combination of IV steroids, remdesivir and Baricitinib, she has shown considerable improvement and now at rest she is symptom-free and on room air. Will continue monitoring inflammatory  markers.  Encouraged the patient to sit up in chair in the daytime use I-S and flutter valve for pulmonary toiletry and then prone in bed when at night.  Will advance activity and titrate down oxygen as possible.    Recent Labs  Lab 05/04/20 0109 05/04/20 0109 05/05/20 0301 05/06/20 0309 05/07/20 0434 05/08/20 1029 05/09/20 1335 05/09/20 1336 05/09/20 1814  WBC 14.1*  --  13.7* 11.2* 9.9  --  10.7*  --   --  PLT 329  --  349 356 394  --  593*  --   --   CRP  --   --   --  17.1* 13.1*  --   --   --  3.3*  BNP  --   --   --   --   --   --   --  76.7  --   DDIMER 1.54*   < > 0.93* 0.74* 0.83* 1.00*  --   --  0.92*  AST 87*  --  38 36 59*  --  175*  --   --   ALT 88*  --  59* 52* 70*  --  279*  --   --   ALKPHOS 74  --  83 85 101  --  189*  --   --   BILITOT 0.5  --  0.5 0.6 0.5  --  0.7  --   --   ALBUMIN 2.9*  --  2.6* 2.6* 2.5*  --  3.0*  --   --   INR  --   --   --   --   --  1.0  --   --   --    < > = values in this interval not displayed.    2.  Decreased oral intake, generalized weakness, hypotension and syncope.  All due to COVID-19 infection, blood pressure is improved after hydration.  Stable echocardiogram which appears nonacute.  3.  Fall with left ankle fracture.  Reduced and splinted in the ER on 04/27/2020, she has lateral and medial malleolus fractures.  Orthopedic on board.  I have communicated to orthopedic surgery the patient is okay to proceed for surgery from medical standpoint on 05/10/20.  She is mild to moderate risk for adverse cardiopulmonary outcome, she and her husband know this and want to proceed with surgery is most likely to happen on 05/10/2020.  4.  Mild transaminitis.  Asymptomatic due to COVID-19 viral infection.  Monitor trend.  5.  PSVT.  Due to dehydration, currently on low-dose metoprolol continue.  Has been adequately hydrated.  6.  Mild intermittent asthma.  Stable continue supportive care.  No wheezing.  7.  Dyslipidemia.  On  statin.   Condition - Fair  Family Communication  :  Husband Merry Proud [phone] on 05/08/2020, 05/10/20 -  message left at 11:35 AM  Code Status :  Full  Consults  :  Ortho  Procedures  :    TTE - 1. Left ventricular ejection fraction, by estimation, is 55 to 60%. The left ventricle has normal function. The left ventricle has no regional wall motion abnormalities. Left ventricular diastolic parameters are consistent with Grade II diastolic dysfunction (pseudonormalization).  2. Right ventricular systolic function is normal. The right ventricular size is normal. There is normal pulmonary artery systolic pressure. The estimated right ventricular systolic pressure is 18.2 mmHg.  3. The mitral valve is normal in structure. Trivial mitral valve regurgitation. No evidence of mitral stenosis.  4. The aortic valve is tricuspid. Aortic valve regurgitation is mild. No aortic stenosis is present.  5. The inferior vena cava is normal in size with greater than 50% respiratory variability, suggesting right atrial pressure of 3 mmHg.  PUD Prophylaxis : PPI  Disposition Plan  :    Status is: Inpatient  Remains inpatient appropriate because:IV treatments appropriate due to intensity of illness or inability to take PO   Dispo: The patient is from: Home  Anticipated d/c is to: Home              Anticipated d/c date is: > 3 days              Patient currently is not medically stable to d/c.  DVT Prophylaxis  :  Lovenox   Lab Results  Component Value Date   PLT 593 (H) 05/09/2020    Diet :  Diet Order            Diet regular Room service appropriate? Yes; Fluid consistency: Thin  Diet effective now                  Inpatient Medications  Scheduled Meds: . AeroChamber Plus Flo-Vu Medium  1 each Other Once  . aspirin EC  81 mg Oral Daily  . baricitinib  4 mg Oral Daily  . enoxaparin (LOVENOX) injection  40 mg Subcutaneous Q24H  . feeding supplement (ENSURE ENLIVE)  237 mL Oral  BID BM  . methylPREDNISolone (SOLU-MEDROL) injection  60 mg Intravenous Daily  . metoprolol tartrate  12.5 mg Oral BID  . midodrine  2.5 mg Oral BID WC  . mometasone-formoterol  2 puff Inhalation BID  . montelukast  10 mg Oral Daily  . pantoprazole  40 mg Oral Daily  . rosuvastatin  20 mg Oral Daily  . senna  2 tablet Oral Daily   Continuous Infusions: PRN Meds:.acetaminophen, albuterol, alum & mag hydroxide-simeth, bisacodyl, guaiFENesin-dextromethorphan, menthol-cetylpyridinium, [DISCONTINUED] ondansetron **OR** ondansetron (ZOFRAN) IV, polyethylene glycol, sodium chloride, sodium phosphate  Antibiotics  :    Anti-infectives (From admission, onward)   Start     Dose/Rate Route Frequency Ordered Stop   05/01/20 1000  remdesivir 100 mg in sodium chloride 0.9 % 100 mL IVPB       "Followed by" Linked Group Details   100 mg 200 mL/hr over 30 Minutes Intravenous Daily 04/30/20 1444 05/04/20 0944   04/30/20 1600  remdesivir 200 mg in sodium chloride 0.9% 250 mL IVPB       "Followed by" Linked Group Details   200 mg 580 mL/hr over 30 Minutes Intravenous Once 04/30/20 1444 04/30/20 2110       Time Spent in minutes  30   Lala Lund M.D on 05/10/2020 at 11:28 AM  To page go to [email or web address] - password [facility]  [facility] -  Office  [phone]    See all Orders from today for further details    Objective:   Vitals:   05/09/20 0750 05/09/20 1636 05/10/20 0059 05/10/20 0753  BP: 123/77 124/83 128/82 (!) 142/75  Pulse: 62 85 71 (!) 55  Resp: 19 19 20 16   Temp: 98.1 F (36.7 C) 98.7 F (37.1 C) 98 F (36.7 C) 97.7 F (36.5 C)  TempSrc:   Oral   SpO2: 92% 94% 94% 97%  Weight:      Height:        Wt Readings from Last 3 Encounters:  05/01/20 80.7 kg  04/27/20 80.7 kg  12/06/19 82.6 kg     Intake/Output Summary (Last 24 hours) at 05/10/2020 1128 Last data filed at 05/10/2020 0900 Gross per 24 hour  Intake 480 ml  Output 1300 ml  Net -820 ml     Physical  Exam  Awake Alert, No new F.N deficits, Normal affect Weld.AT,PERRAL Supple Neck,No JVD, No cervical lymphadenopathy appriciated.  Symmetrical Chest wall movement, Good air movement bilaterally, CTAB RRR,No Gallops, Rubs or new Murmurs, No Parasternal Heave +ve B.Sounds,  Abd Soft, No tenderness, No organomegaly appriciated, No rebound - guarding or rigidity. No Cyanosis, left ankle under bandage    Data Review:    CBC Recent Labs  Lab 05/04/20 0109 05/05/20 0301 05/06/20 0309 05/07/20 0434 05/09/20 1335  WBC 14.1* 13.7* 11.2* 9.9 10.7*  HGB 11.5* 11.1* 11.4* 10.7* 11.6*  HCT 36.0 34.5* 35.5* 33.4* 36.4  PLT 329 349 356 394 593*  MCV 85.1 85.0 86.4 86.1 86.7  MCH 27.2 27.3 27.7 27.6 27.6  MCHC 31.9 32.2 32.1 32.0 31.9  RDW 13.3 13.2 13.3 13.2 13.4  LYMPHSABS 1.7 1.0  --   --   --   MONOABS 1.1* 0.8  --   --   --   EOSABS 0.0 0.1  --   --   --   BASOSABS 0.0 0.0  --   --   --     Recent Labs  Lab 05/04/20 0109 05/04/20 0109 05/05/20 0301 05/06/20 0309 05/07/20 0434 05/08/20 1029 05/09/20 1335 05/09/20 1336 05/09/20 1814  NA 137  --  136 137 134*  --  141  --   --   K 3.7  --  3.4* 3.8 3.6  --  3.7  --   --   CL 101  --  99 102 98  --  105  --   --   CO2 26  --  26 24 25   --  22  --   --   GLUCOSE 91  --  117* 112* 114*  --  115*  --   --   BUN 24*  --  14 15 11   --  19  --   --   CREATININE 0.85  --  0.60 0.72 0.67  --  0.78  --   --   CALCIUM 8.4*  --  8.3* 8.2* 8.2*  --  9.0  --   --   AST 87*  --  38 36 59*  --  175*  --   --   ALT 88*  --  59* 52* 70*  --  279*  --   --   ALKPHOS 74  --  83 85 101  --  189*  --   --   BILITOT 0.5  --  0.5 0.6 0.5  --  0.7  --   --   ALBUMIN 2.9*  --  2.6* 2.6* 2.5*  --  3.0*  --   --   MG 2.0  --  2.1  --   --   --  2.3  --   --   CRP  --   --   --  17.1* 13.1*  --   --   --  3.3*  DDIMER 1.54*   < > 0.93* 0.74* 0.83* 1.00*  --   --  0.92*  INR  --   --   --   --   --  1.0  --   --   --   BNP  --   --   --   --   --    --   --  76.7  --    < > = values in this interval not displayed.    ------------------------------------------------------------------------------------------------------------------ No results for input(s): CHOL, HDL, LDLCALC, TRIG, CHOLHDL, LDLDIRECT in the last 72 hours.  No results found for: HGBA1C ------------------------------------------------------------------------------------------------------------------ No results for input(s): TSH, T4TOTAL, T3FREE, THYROIDAB in the last 72 hours.  Invalid input(s): FREET3  Cardiac Enzymes No results  for input(s): CKMB, TROPONINI, MYOGLOBIN in the last 168 hours.  Invalid input(s): CK ------------------------------------------------------------------------------------------------------------------    Component Value Date/Time   BNP 76.7 05/09/2020 1336    Micro Results Recent Results (from the past 240 hour(s))  Blood Culture (routine x 2)     Status: None   Collection Time: 04/30/20 12:30 PM   Specimen: BLOOD RIGHT ARM  Result Value Ref Range Status   Specimen Description BLOOD RIGHT ARM  Final   Special Requests   Final    BOTTLES DRAWN AEROBIC AND ANAEROBIC Blood Culture adequate volume   Culture   Final    NO GROWTH 5 DAYS Performed at [facility], [address], [place], [place] [address]    Report Status 05/05/2020 FINAL  Final  SARS Coronavirus 2 by RT PCR (hospital order, performed in [facility] hospital lab) Nasopharyngeal Nasopharyngeal Swab     Status: Abnormal   Collection Time: 04/30/20 12:40 PM   Specimen: Nasopharyngeal Swab  Result Value Ref Range Status   SARS Coronavirus 2 POSITIVE (A) NEGATIVE Final    Comment: (NOTE) SARS-CoV-2 target nucleic acids are DETECTED  SARS-CoV-2 RNA is generally detectable in upper respiratory specimens  during the acute phase of infection.  Positive results are indicative  of the presence of the identified virus, but do not rule out bacterial infection or  co-infection with other pathogens not detected by the test.  Clinical correlation with patient history and  other diagnostic information is necessary to determine patient infection status.  The expected result is negative.  Fact Sheet for Patients:   StrictlyIdeas.no   Fact Sheet for Healthcare Providers:   BankingDealers.co.za    This test is not yet approved or cleared by the [place] FDA and  has been authorized for detection and/or diagnosis of SARS-CoV-2 by FDA under an Emergency Use Authorization (EUA).  This EUA will remain in effect (meaning this test can be used) for the duration of  the COVID-19 declaration under Section 564(b)(1)  of the Act, 21 U.S.C. section 360-bbb-3(b)(1), unless the authorization is terminated or revoked sooner.  Performed at [facility], [place] [address], [place], [place] [address]   Blood Culture (routine x 2)     Status: None   Collection Time: 04/30/20  2:10 PM   Specimen: BLOOD RIGHT FOREARM  Result Value Ref Range Status   Specimen Description BLOOD RIGHT FOREARM  Final   Special Requests   Final    BOTTLES DRAWN AEROBIC AND ANAEROBIC Blood Culture results may not be optimal due to an inadequate volume of blood received in culture bottles   Culture   Final    NO GROWTH 5 DAYS Performed at [facility], [place] [address], [place],  [address]    Report Status 05/05/2020 FINAL  Final    Radiology Reports DG Ankle Complete Left  Result Date: 04/27/2020 CLINICAL DATA:  The patient suffered left medial and lateral malleolar fractures in a fall today. Post reduction imaging. Initial encounter. EXAM: LEFT ANKLE COMPLETE - 3+ VIEW COMPARISON:  Plain films of the left ankle earlier today. FINDINGS: The patient is now in a fiberglass cast. Position and alignment of the patient's medial and lateral malleolar fractures are markedly improved. No new abnormality. IMPRESSION: Marked  improvement in position and alignment of medial and lateral malleolar fractures. No new abnormality. Electronically Signed   By: Inge Rise M.D.   On: 04/27/2020 15:24   DG Ankle Complete Left  Result  Date: 04/27/2020 CLINICAL DATA:  The patient suffered a left ankle injury in a fall today. Initial encounter. EXAM: LEFT ANKLE COMPLETE - 3+ VIEW COMPARISON:  None. FINDINGS: The patient has acute medial and lateral malleolar fractures. Both fractures are laterally displaced approximately 1 shaft width. The talus is laterally subluxed 1.1 cm. No other acute bony or joint abnormality is identified. There is soft tissue swelling about the patient's fractures. Pins for hallux valgus repair in the distal first metatarsal noted. IMPRESSION: Acute, displaced medial and lateral malleolar fractures as described. Electronically Signed   By: Inge Rise M.D.   On: 04/27/2020 14:11   DG Chest Port 1 View  Result Date: 05/08/2020 CLINICAL DATA:  Shortness of breath.  Recent COVID diagnosis EXAM: PORTABLE CHEST 1 VIEW COMPARISON:  04/30/2020 FINDINGS: Stable cardiomediastinal contours. No pleural effusion or edema. Interval increase in peripheral interstitial and airspace densities within the right upper lobe and right lower lobe as well as the left midlung and left base. IMPRESSION: Interval increase in bilateral pulmonary opacities compatible with progression of multifocal infection. Electronically Signed   By: Kerby Moors M.D.   On: 05/08/2020 11:44   DG Chest Port 1 View  Result Date: 04/30/2020 CLINICAL DATA:  Cough and shortness of breath. Recent COVID diagnosis. EXAM: PORTABLE CHEST 1 VIEW COMPARISON:  Chest x-ray dated May 15, 2016. FINDINGS: The heart size and mediastinal contours are within normal limits. Streaky linear and reticulonodular interstitial thickening and opacity at both lung bases. No focal consolidation, pleural effusion, or pneumothorax. No acute osseous abnormality.  IMPRESSION: 1. Bilateral lower lobe interstitial and airspace disease, consistent with atypical infection/viral pneumonia. Electronically Signed   By: Titus Dubin M.D.   On: 04/30/2020 12:23   ECHOCARDIOGRAM LIMITED  Result Date: 05/07/2020    ECHOCARDIOGRAM LIMITED REPORT   Patient Name:   Aylin S Searles Date of Exam: 05/07/2020 Medical Rec #:  [id number]   Height:       62.0 in Accession #:    [id number]  Weight:       178.0 lb Date of Birth:  04-06-1966  BSA:          1.819 m Patient Age:    67 years    BP:           98/73 mmHg Patient Gender: F           HR:           69 bpm. Exam Location:  Inpatient Procedure: Limited Echo, Limited Color Doppler and Cardiac Doppler Indications:    Hypotension  History:        Patient has prior history of Echocardiogram examinations, most                 recent 07/05/2015. Covid-19 Positive; Aortic Valve Disease.  Sonographer:    Mikki Santee RDCS (AE) Referring Phys: [id number] IMPRESSIONS  1. Left ventricular ejection fraction, by estimation, is 55 to 60%. The left ventricle has normal function. The left ventricle has no regional wall motion abnormalities. Left ventricular diastolic parameters are consistent with Grade II diastolic dysfunction (pseudonormalization).  2. Right ventricular systolic function is normal. The right ventricular size is normal. There is normal pulmonary artery systolic pressure. The estimated right ventricular systolic pressure is 28.7 mmHg.  3. The mitral valve is normal in structure. Trivial mitral valve regurgitation. No evidence of mitral stenosis.  4. The aortic valve is tricuspid. Aortic valve regurgitation is mild. No aortic stenosis is present.  5. The inferior vena cava is normal in size with greater than 50% respiratory variability, suggesting right atrial pressure of 3 mmHg. FINDINGS  Left Ventricle: Left ventricular ejection fraction, by estimation, is 55 to 60%. The left ventricle has normal function. The left  ventricle has no regional wall motion abnormalities. The left ventricular internal cavity size was normal in size. There is  no left ventricular hypertrophy. Right Ventricle: The right ventricular size is normal. No increase in right ventricular wall thickness. Right ventricular systolic function is normal. There is normal pulmonary artery systolic pressure. The tricuspid regurgitant velocity is 2.19 m/s, and  with an assumed right atrial pressure of 3 mmHg, the estimated right ventricular systolic pressure is 79.0 mmHg. Left Atrium: Left atrial size was normal in size. Right Atrium: Right atrial size was normal in size. Mitral Valve: The mitral valve is normal in structure. Trivial mitral valve regurgitation. No evidence of mitral valve stenosis. Tricuspid Valve: The tricuspid valve is normal in structure. Tricuspid valve regurgitation is trivial. Aortic Valve: The aortic valve is tricuspid. Aortic valve regurgitation is mild. Aortic regurgitation PHT measures 750 msec. No aortic stenosis is present. Aorta: The aortic root is normal in size and structure. Venous: The inferior vena cava is normal in size with greater than 50% respiratory variability, suggesting right atrial pressure of 3 mmHg. IAS/Shunts: No atrial level shunt detected by color flow Doppler. LEFT VENTRICLE PLAX 2D LVIDd:         4.43 cm  Diastology LVIDs:         3.30 cm  LV e' lateral:   7.29 cm/s LV PW:         0.90 cm  LV E/e' lateral: 11.6 LV IVS:        0.86 cm  LV e' medial:    6.64 cm/s LVOT diam:     1.80 cm  LV E/e' medial:  12.8 LVOT Area:     2.54 cm  LEFT ATRIUM             Index       RIGHT ATRIUM           Index LA diam:        3.40 cm 1.87 cm/m  RA Area:     14.20 cm LA Vol (A2C):   46.3 ml 25.45 ml/m RA Volume:   32.50 ml  17.86 ml/m LA Vol (A4C):   39.1 ml 21.49 ml/m LA Biplane Vol: 47.0 ml 25.83 ml/m  AORTIC VALVE AI PHT:      750 msec  AORTA Ao Root diam: 3.00 cm MITRAL VALVE               TRICUSPID VALVE MV Area (PHT):  2.91 cm    TR Peak grad:   19.2 mmHg MV Decel Time: 261 msec    TR Vmax:        219.00 cm/s MV E velocity: 84.80 cm/s MV A velocity: 80.60 cm/s  SHUNTS MV E/A ratio:  1.05        Systemic Diam: 1.80 cm Loralie Champagne MD Electronically signed by Loralie Champagne MD Signature Date/Time: 05/07/2020/4:46:48 PM    Final

## 2020-05-11 ENCOUNTER — Inpatient Hospital Stay (HOSPITAL_COMMUNITY): Payer: BC Managed Care – PPO | Admitting: Certified Registered Nurse Anesthetist

## 2020-05-11 ENCOUNTER — Encounter (HOSPITAL_COMMUNITY)
Admission: EM | Disposition: A | Discharge status: Home / Self Care | Payer: Self-pay | Source: Home / Self Care | Attending: Internal Medicine

## 2020-05-11 ENCOUNTER — Inpatient Hospital Stay (HOSPITAL_COMMUNITY): Payer: BC Managed Care – PPO

## 2020-05-11 ENCOUNTER — Encounter (HOSPITAL_COMMUNITY): Payer: Self-pay | Admitting: Internal Medicine

## 2020-05-11 HISTORY — PX: ORIF ANKLE FRACTURE: SHX5408

## 2020-05-11 LAB — CBC WITH DIFFERENTIAL/PLATELET
Abs Immature Granulocytes: 0.29 10*3/uL — ABNORMAL HIGH (ref 0.00–0.07)
Basophils Absolute: 0 10*3/uL (ref 0.0–0.1)
Basophils Relative: 0 %
Eosinophils Absolute: 0 10*3/uL (ref 0.0–0.5)
Eosinophils Relative: 0 %
HCT: 32.6 % — ABNORMAL LOW (ref 36.0–46.0)
Hemoglobin: 10.6 g/dL — ABNORMAL LOW (ref 12.0–15.0)
Immature Granulocytes: 4 %
Lymphocytes Relative: 10 %
Lymphs Abs: 0.7 10*3/uL (ref 0.7–4.0)
MCH: 27.8 pg (ref 26.0–34.0)
MCHC: 32.5 g/dL (ref 30.0–36.0)
MCV: 85.6 fL (ref 80.0–100.0)
Monocytes Absolute: 0.7 10*3/uL (ref 0.1–1.0)
Monocytes Relative: 9 %
Neutro Abs: 5.7 10*3/uL (ref 1.7–7.7)
Neutrophils Relative %: 77 %
Platelets: 529 10*3/uL — ABNORMAL HIGH (ref 150–400)
RBC: 3.81 MIL/uL — ABNORMAL LOW (ref 3.87–5.11)
RDW: 13.2 % (ref 11.5–15.5)
WBC: 7.3 10*3/uL (ref 4.0–10.5)
nRBC: 0 % (ref 0.0–0.2)

## 2020-05-11 LAB — COMPREHENSIVE METABOLIC PANEL
ALT: 187 U/L — ABNORMAL HIGH (ref 0–44)
AST: 63 U/L — ABNORMAL HIGH (ref 15–41)
Albumin: 2.8 g/dL — ABNORMAL LOW (ref 3.5–5.0)
Alkaline Phosphatase: 155 U/L — ABNORMAL HIGH (ref 38–126)
Anion gap: 9 (ref 5–15)
BUN: 17 mg/dL (ref 6–20)
CO2: 27 mmol/L (ref 22–32)
Calcium: 8.7 mg/dL — ABNORMAL LOW (ref 8.9–10.3)
Chloride: 101 mmol/L (ref 98–111)
Creatinine, Ser: 0.83 mg/dL (ref 0.44–1.00)
GFR calc Af Amer: >60 mL/min (ref >60)
GFR calc non Af Amer: >60 mL/min (ref >60)
Glucose, Bld: 116 mg/dL — ABNORMAL HIGH (ref 70–99)
Potassium: 4.2 mmol/L (ref 3.5–5.1)
Sodium: 137 mmol/L (ref 135–145)
Total Bilirubin: 0.6 mg/dL (ref 0.3–1.2)
Total Protein: 5.9 g/dL — ABNORMAL LOW (ref 6.5–8.1)

## 2020-05-11 LAB — D-DIMER, QUANTITATIVE: D-Dimer, Quant: 0.81 ug/mL-FEU — ABNORMAL HIGH (ref 0.00–0.50)

## 2020-05-11 LAB — C-REACTIVE PROTEIN: CRP: 1.3 mg/dL — ABNORMAL HIGH (ref <1.0)

## 2020-05-11 LAB — BRAIN NATRIURETIC PEPTIDE: B Natriuretic Peptide: 56.8 pg/mL (ref 0.0–100.0)

## 2020-05-11 LAB — MAGNESIUM: Magnesium: 2.3 mg/dL (ref 1.7–2.4)

## 2020-05-11 SURGERY — OPEN REDUCTION INTERNAL FIXATION (ORIF) ANKLE FRACTURE
Anesthesia: General | Site: Ankle | Laterality: Left

## 2020-05-11 MED ORDER — MIDAZOLAM HCL 2 MG/2ML IJ SOLN
INTRAMUSCULAR | Status: DC | PRN
  Administered 2020-05-11: 2 mg via INTRAVENOUS

## 2020-05-11 MED ORDER — CEFAZOLIN SODIUM-DEXTROSE 2-3 GM-%(50ML) IV SOLR
INTRAVENOUS | Status: DC | PRN
  Administered 2020-05-11: 2 g via INTRAVENOUS

## 2020-05-11 MED ORDER — AMISULPRIDE (ANTIEMETIC) 5 MG/2ML IV SOLN: 10.0000 mg | Freq: Once | INTRAVENOUS | Status: DC | PRN

## 2020-05-11 MED ORDER — ACETAMINOPHEN 10 MG/ML IV SOLN: 1000.0000 mg | Freq: Once | INTRAVENOUS | Status: DC | PRN

## 2020-05-11 MED ORDER — ONDANSETRON HCL 4 MG/2ML IJ SOLN
INTRAMUSCULAR | Status: AC
  Filled 2020-05-11: qty 2

## 2020-05-11 MED ORDER — FENTANYL CITRATE (PF) 100 MCG/2ML IJ SOLN
INTRAMUSCULAR | Status: DC | PRN
  Administered 2020-05-11: 100 ug via INTRAVENOUS
  Administered 2020-05-11: 50 ug via INTRAVENOUS

## 2020-05-11 MED ORDER — OXYCODONE HCL 5 MG PO TABS: 5.0000 mg | ORAL_TABLET | ORAL | 0 refills | Status: DC | PRN

## 2020-05-11 MED ORDER — DEXAMETHASONE SODIUM PHOSPHATE 10 MG/ML IJ SOLN
INTRAMUSCULAR | Status: AC
  Filled 2020-05-11: qty 1

## 2020-05-11 MED ORDER — LIDOCAINE 2% (20 MG/ML) 5 ML SYRINGE
INTRAMUSCULAR | Status: AC
  Filled 2020-05-11: qty 5

## 2020-05-11 MED ORDER — GLYCOPYRROLATE PF 0.2 MG/ML IJ SOSY
PREFILLED_SYRINGE | INTRAMUSCULAR | Status: AC
  Filled 2020-05-11: qty 1

## 2020-05-11 MED ORDER — ONDANSETRON HCL 4 MG/2ML IJ SOLN
INTRAMUSCULAR | Status: DC | PRN
  Administered 2020-05-11: 4 mg via INTRAVENOUS

## 2020-05-11 MED ORDER — GLYCOPYRROLATE PF 0.2 MG/ML IJ SOSY
PREFILLED_SYRINGE | INTRAMUSCULAR | Status: DC | PRN
  Administered 2020-05-11: .2 mg via INTRAVENOUS

## 2020-05-11 MED ORDER — HYDROMORPHONE HCL 1 MG/ML IJ SOLN
0.2500 mg | INTRAMUSCULAR | Status: DC | PRN
  Administered 2020-05-11: 0.5 mg via INTRAVENOUS

## 2020-05-11 MED ORDER — PHENYLEPHRINE 40 MCG/ML (10ML) SYRINGE FOR IV PUSH (FOR BLOOD PRESSURE SUPPORT)
PREFILLED_SYRINGE | INTRAVENOUS | Status: AC
  Filled 2020-05-11: qty 10

## 2020-05-11 MED ORDER — ACETAMINOPHEN 325 MG PO TABS: 325.0000 mg | ORAL_TABLET | Freq: Once | ORAL | Status: DC | PRN

## 2020-05-11 MED ORDER — LACTATED RINGERS IV SOLN: INTRAVENOUS | Status: DC | PRN

## 2020-05-11 MED ORDER — MIDAZOLAM HCL 2 MG/2ML IJ SOLN
INTRAMUSCULAR | Status: AC
  Filled 2020-05-11: qty 2

## 2020-05-11 MED ORDER — PROPOFOL 10 MG/ML IV BOLUS
INTRAVENOUS | Status: AC
  Filled 2020-05-11: qty 20

## 2020-05-11 MED ORDER — MEPERIDINE HCL 25 MG/ML IJ SOLN: 6.2500 mg | INTRAMUSCULAR | Status: DC | PRN

## 2020-05-11 MED ORDER — ROCURONIUM BROMIDE 10 MG/ML (PF) SYRINGE
PREFILLED_SYRINGE | INTRAVENOUS | Status: DC | PRN
  Administered 2020-05-11: 50 mg via INTRAVENOUS

## 2020-05-11 MED ORDER — LACTATED RINGERS IV SOLN: INTRAVENOUS | Status: DC

## 2020-05-11 MED ORDER — SUCCINYLCHOLINE CHLORIDE 20 MG/ML IJ SOLN
INTRAMUSCULAR | Status: DC | PRN
  Administered 2020-05-11: 120 mg via INTRAVENOUS

## 2020-05-11 MED ORDER — ACETAMINOPHEN 10 MG/ML IV SOLN
INTRAVENOUS | Status: AC
  Administered 2020-05-11: 1000 mg via INTRAVENOUS
  Filled 2020-05-11: qty 100

## 2020-05-11 MED ORDER — PROPOFOL 10 MG/ML IV BOLUS
INTRAVENOUS | Status: DC | PRN
  Administered 2020-05-11: 130 mg via INTRAVENOUS

## 2020-05-11 MED ORDER — HYDROMORPHONE HCL 1 MG/ML IJ SOLN
INTRAMUSCULAR | Status: AC
  Administered 2020-05-11: 0.5 mg via INTRAVENOUS
  Filled 2020-05-11: qty 1

## 2020-05-11 MED ORDER — ACETAMINOPHEN 160 MG/5ML PO SOLN: 325.0000 mg | Freq: Once | ORAL | Status: DC | PRN

## 2020-05-11 MED ORDER — OXYCODONE HCL 5 MG PO TABS: 5.0000 mg | ORAL_TABLET | Freq: Once | ORAL | Status: AC | PRN

## 2020-05-11 MED ORDER — LIDOCAINE 2% (20 MG/ML) 5 ML SYRINGE
INTRAMUSCULAR | Status: DC | PRN
  Administered 2020-05-11: 40 mg via INTRAVENOUS

## 2020-05-11 MED ORDER — PHENYLEPHRINE 40 MCG/ML (10ML) SYRINGE FOR IV PUSH (FOR BLOOD PRESSURE SUPPORT)
PREFILLED_SYRINGE | INTRAVENOUS | Status: DC | PRN
  Administered 2020-05-11 (×2): 80 ug via INTRAVENOUS
  Administered 2020-05-11: 160 ug via INTRAVENOUS
  Administered 2020-05-11: 40 ug via INTRAVENOUS

## 2020-05-11 MED ORDER — SUGAMMADEX SODIUM 200 MG/2ML IV SOLN
INTRAVENOUS | Status: DC | PRN
  Administered 2020-05-11: 200 mg via INTRAVENOUS

## 2020-05-11 MED ORDER — FENTANYL CITRATE (PF) 250 MCG/5ML IJ SOLN
INTRAMUSCULAR | Status: AC
Start: 2020-05-11 — End: ?
  Filled 2020-05-11: qty 5

## 2020-05-11 MED ORDER — OXYCODONE HCL 5 MG PO TABS
ORAL_TABLET | ORAL | Status: AC
  Administered 2020-05-11: 5 mg via ORAL
  Filled 2020-05-11: qty 1

## 2020-05-11 MED ORDER — OXYCODONE HCL 5 MG/5ML PO SOLN: 5.0000 mg | Freq: Once | ORAL | Status: AC | PRN

## 2020-05-11 MED ORDER — ROCURONIUM BROMIDE 10 MG/ML (PF) SYRINGE
PREFILLED_SYRINGE | INTRAVENOUS | Status: AC
  Filled 2020-05-11: qty 10

## 2020-05-11 SURGICAL SUPPLY — 61 items
ALCOHOL 70% 16 OZ (MISCELLANEOUS) ×2 IMPLANT
BANDAGE ESMARK 6X9 LF (GAUZE/BANDAGES/DRESSINGS) IMPLANT
BIT DRILL 2 CANN GRADUATED (BIT) ×2 IMPLANT
BIT DRILL 2.5 CANN LNG (BIT) ×2 IMPLANT
BIT DRILL 2.6 CANN (BIT) ×2 IMPLANT
BIT DRILL 3 CANN ENDOSCOPIC (BIT) ×2 IMPLANT
BNDG COHESIVE 4X5 TAN STRL (GAUZE/BANDAGES/DRESSINGS) ×2 IMPLANT
BNDG ELASTIC 4X5.8 VLCR STR LF (GAUZE/BANDAGES/DRESSINGS) IMPLANT
BNDG ELASTIC 6X5.8 VLCR STR LF (GAUZE/BANDAGES/DRESSINGS) IMPLANT
BNDG ESMARK 6X9 LF (GAUZE/BANDAGES/DRESSINGS)
CANISTER SUCT 3000ML PPV (MISCELLANEOUS) ×2 IMPLANT
COVER SURGICAL LIGHT HANDLE (MISCELLANEOUS) ×2 IMPLANT
COVER WAND RF STERILE (DRAPES) ×2 IMPLANT
CUFF TOURN SGL QUICK 34 (TOURNIQUET CUFF)
CUFF TRNQT CYL 34X4.125X (TOURNIQUET CUFF) IMPLANT
DRAPE C-ARM 42X72 X-RAY (DRAPES) ×2 IMPLANT
DRAPE C-ARMOR (DRAPES) ×2 IMPLANT
DRAPE U-SHAPE 47X51 STRL (DRAPES) ×2 IMPLANT
DRSG ADAPTIC 3X8 NADH LF (GAUZE/BANDAGES/DRESSINGS) ×2 IMPLANT
DRSG PAD ABDOMINAL 8X10 ST (GAUZE/BANDAGES/DRESSINGS) ×2 IMPLANT
DURAPREP 26ML APPLICATOR (WOUND CARE) ×2 IMPLANT
ELECT REM PT RETURN 9FT ADLT (ELECTROSURGICAL) ×2
ELECTRODE REM PT RTRN 9FT ADLT (ELECTROSURGICAL) ×1 IMPLANT
GAUZE SPONGE 4X4 12PLY STRL (GAUZE/BANDAGES/DRESSINGS) ×2 IMPLANT
GLOVE BIO SURGEON STRL SZ7.5 (GLOVE) ×2 IMPLANT
GLOVE BIOGEL PI IND STRL 8 (GLOVE) ×1 IMPLANT
GLOVE BIOGEL PI INDICATOR 8 (GLOVE) ×1
GOWN STRL REUS W/ TWL LRG LVL3 (GOWN DISPOSABLE) ×2 IMPLANT
GOWN STRL REUS W/ TWL XL LVL3 (GOWN DISPOSABLE) ×1 IMPLANT
GOWN STRL REUS W/TWL LRG LVL3 (GOWN DISPOSABLE) ×4
GOWN STRL REUS W/TWL XL LVL3 (GOWN DISPOSABLE) ×2
GUIDEWIRE 1.35MM (WIRE) ×4 IMPLANT
KIT BASIN OR (CUSTOM PROCEDURE TRAY) ×2 IMPLANT
KIT TURNOVER KIT B (KITS) ×2 IMPLANT
NEEDLE HYPO 25GX1X1/2 BEV (NEEDLE) IMPLANT
NS IRRIG 1000ML POUR BTL (IV SOLUTION) ×2 IMPLANT
PACK ORTHO EXTREMITY (CUSTOM PROCEDURE TRAY) ×2 IMPLANT
PAD ARMBOARD 7.5X6 YLW CONV (MISCELLANEOUS) ×4 IMPLANT
PAD CAST 4YDX4 CTTN HI CHSV (CAST SUPPLIES) ×2 IMPLANT
PADDING CAST COTTON 4X4 STRL (CAST SUPPLIES) ×4
PADDING CAST COTTON 6X4 STRL (CAST SUPPLIES) ×2 IMPLANT
PLATE LOCK THIRD TI 7H TUBULAR (Plate) ×2 IMPLANT
SCREW CANN THRD 4X30 (Screw) ×2 IMPLANT
SCREW CORT 3.5X18 THRD (Screw) ×2 IMPLANT
SCREW CORT FT 3.5X22 (Screw) ×2 IMPLANT
SCREW CORTICAL LP 3.0X14MM (Screw) ×2 IMPLANT
SCREW LP 3.5X12MM (Screw) ×6 IMPLANT
SCREW LP TI 3.5X14MM (Screw) ×2 IMPLANT
SCREW QCKFIX CANN 4.0X40MM (Screw) ×2 IMPLANT
SPONGE LAP 18X18 RF (DISPOSABLE) IMPLANT
SUCTION FRAZIER HANDLE 10FR (MISCELLANEOUS) ×2
SUCTION TUBE FRAZIER 10FR DISP (MISCELLANEOUS) ×1 IMPLANT
SUT ETHILON 3 0 PS 1 (SUTURE) ×6 IMPLANT
SUT VIC AB 0 CT1 27 (SUTURE) ×4
SUT VIC AB 0 CT1 27XBRD ANBCTR (SUTURE) ×2 IMPLANT
SUT VIC AB 2-0 CT1 27 (SUTURE) ×6
SUT VIC AB 2-0 CT1 TAPERPNT 27 (SUTURE) ×3 IMPLANT
SYR CONTROL 10ML LL (SYRINGE) IMPLANT
TOWEL GREEN STERILE (TOWEL DISPOSABLE) ×4 IMPLANT
TUBE CONNECTING 12X1/4 (SUCTIONS) ×2 IMPLANT
YANKAUER SUCT BULB TIP NO VENT (SUCTIONS) ×2 IMPLANT

## 2020-05-11 NOTE — Anesthesia Procedure Notes (Signed)
Procedure Name: Intubation Date/Time: 05/11/2020 11:30 AM Performed by: Barrington Ellison, CRNA Pre-anesthesia Checklist: Patient identified, Emergency Drugs available, Suction available and Patient being monitored Patient Re-evaluated:Patient Re-evaluated prior to induction Oxygen Delivery Method: Circle System Utilized Preoxygenation: Pre-oxygenation with 100% oxygen Induction Type: IV induction and Rapid sequence Laryngoscope Size: Glidescope and 3 Grade View: Grade I Tube type: Oral Tube size: 7.0 mm Number of attempts: 1 Airway Equipment and Method: Oral airway,  Video-laryngoscopy and Stylet Placement Confirmation: ETT inserted through vocal cords under direct vision,  positive ETCO2 and breath sounds checked- equal and bilateral Secured at: 21 cm Tube secured with: Tape Dental Injury: Teeth and Oropharynx as per pre-operative assessment  Comments: Elective glidescope d/t +Covid status

## 2020-05-11 NOTE — Brief Op Note (Signed)
04/30/2020 - 05/11/2020  1:01 PM  PATIENT:  Debbie Allen  54 y.o. female  PRE-OPERATIVE DIAGNOSIS:  left bimalleolar ankle fracture  POST-OPERATIVE DIAGNOSIS:  left bimalleolar ankle fracture  PROCEDURE:  Procedure(s): OPEN REDUCTION INTERNAL FIXATION (ORIF) ANKLE FRACTURE (Left)  SURGEON:  Surgeon(s) and Role:    * Nicholes Stairs, MD - Primary  ASSISTANTS: Jonelle Sidle, PA-C.   ANESTHESIA:   general  EBL:  10 mL   BLOOD ADMINISTERED:none  DRAINS: none   LOCAL MEDICATIONS USED:  NONE  SPECIMEN:  No Specimen  DISPOSITION OF SPECIMEN:  N/A  COUNTS:  YES  TOURNIQUET:   Total Tourniquet Time Documented: Thigh (Left) - 63 minutes Total: Thigh (Left) - 63 minutes   DICTATION: .Note written in EPIC  PLAN OF CARE: Admit to inpatient   PATIENT DISPOSITION:  PACU - hemodynamically stable.   Delay start of Pharmacological VTE agent (>24hrs) due to surgical blood loss or risk of bleeding: not applicable

## 2020-05-11 NOTE — Op Note (Signed)
Date of Surgery: 05/11/2020  INDICATIONS: Ms. Narula is a 54 y.o.-year-old female who sustained a left ankle bimalleolar ankle fracture; she was indicated for open reduction and internal fixation due to the displaced nature of the articular fracture and came to the operating room today for this procedure.  She had an unfortunate turn of events on the admission.  She was noted to have Covid 19.  This created some respiratory issues.  She was therefore kept under close observation by the internal medicine service and the ICU for approximately 7 days while she was requiring oxygen support.  She has since recovered to the point where she is adequate to undergo a general anesthetic and thus we are proceeding with surgery at this time.  In a delayed fashion.  The patient did consent to the procedure after discussion of the risks and benefits.  PREOPERATIVE DIAGNOSIS: left bimalleolar ankle fracture  POSTOPERATIVE DIAGNOSIS: Same.  PROCEDURE:  1. Open treatment of left ankle fracture with internal fixation. Bimalleolar CPT [id number];  2.  Intraoperative fluoroscopy with independent interpretation by myself less than 1 hour. 3.  Application of short leg splint left ankle  SURGEON: Simra Fiebig P. Stann Mainland, M.D.  ASSIST: Jonelle Sidle, PA-C.  Assistant attestation:  PA Mcclung was utilized during the procedure for positioning the patient, reduction of fracture, placement of deep implants, and layered closure of wounds as well as placement of well-padded posterior splint.  ANESTHESIA:  general  TOURNIQUET TIME: 63 minutes at 300 mmHg left thigh  IV FLUIDS AND URINE: See anesthesia.  ESTIMATED BLOOD LOSS: 20 mL.  IMPLANTS: Arthrex one third tubular plate, 7 hole. 4.0 mm cannulated screws x2 medial malleolus.  COMPLICATIONS: None.  DESCRIPTION OF PROCEDURE: The patient was brought to the operating room and placed supine on the operating table.  The patient had been signed prior to the procedure and this  was documented. The patient had the anesthesia placed by the anesthesiologist.  A nonsterile tourniquet was placed on the upper thigh.  The prep verification and incision time-outs were performed to confirm that this was the correct patient, site, side and location. The patient had an SCD on the opposite lower extremity. The patient did receive antibiotics prior to the incision and was re-dosed during the procedure as needed at indicated intervals.  The patient had the lower extremity prepped and draped in the standard surgical fashion.  The extremity was exsanguinated using an esmarch bandage and the tourniquet was inflated to 300 mm Hg.   Incision was made over the distal fibula and the fracture was exposed and reduced anatomically with a clamp. A lag screw was placed. I then applied a 1/3 tubular locking plate and secured it proximally and distally with non-locking screws. Bone quality was good. I used c-arm to confirm satisfactory reduction and fixation.  Of note there was a lateral cortical fragment that was adherent to the anterior tibiofibular syndesmotic ligaments.  This was too Haffey the fragment to independently fixate.  We did reduce this in an anatomic position and apply the one third tubular plate over the top of this bone fragment such that it was very stably placed.  I then turned my attention to the medial malleolus. Incision was made over the medial malleolus and the fracture exposed and held provisionally with a clamp. 2 guidepins were placed for the 4.0 mm cannulated screws and then confirmation of reduction was made with fluoroscopy. I then placed 2  6mm screws which had satisfactory fixation.   The  syndesmosis was stressed using live fluoroscopy and found to be stable.   Throughout the procedure intraoperative fluoroscopy was obtained in both the AP, lateral, and mortise positions.  This was utilized to confirm adequacy of the reduction as well as placement of internal hardware and for  stress radiography.  The wounds were irrigated, and closed with vicryl with routine closure for the skin. The wounds were injected with local anesthetic. Sterile gauze was applied followed by a posterior splint. She was awakened and returned to the PACU in stable and satisfactory condition. There were no complications.  All counts were correct x2  POSTOPERATIVE PLAN: Ms. Kuennen will remain nonweightbearing on this leg for approximately 6 weeks; Ms. Espy will return for suture removal in 2 weeks.  She will be immobilized in a short leg splint and then transitioned to a CAM walker at his first follow up appointment.  Ms. Sankey will receive DVT prophylaxis based on other medications, activity level, and risk ratio of bleeding to thrombosis.  For now, she will return to the hospitalist service for the remainder of her admission.  She will be nonweightbearing and strictly elevating.  She can resume DVT prophylaxis with Lovenox on her return to the floor.  Geralynn Rile, MD [facility] [place] [phone] 1:04 PM

## 2020-05-11 NOTE — Transfer of Care (Signed)
Immediate Anesthesia Transfer of Care Note  Patient: Debbie Allen  Procedure(s) Performed: OPEN REDUCTION INTERNAL FIXATION (ORIF) ANKLE FRACTURE (Left Ankle)  Patient Location: PACU  Anesthesia Type:General  Level of Consciousness: awake, alert  and oriented  Airway & Oxygen Therapy: Patient Spontanous Breathing and Patient connected to nasal cannula oxygen  Post-op Assessment: Report given to RN  Post vital signs: Reviewed and stable  Last Vitals:  Vitals Value Taken Time  BP    Temp    Pulse    Resp    SpO2      Last Pain:  Vitals:   05/10/20 2116  TempSrc:   PainSc: Asleep      Patients Stated Pain Goal: 0 (22/33/61 2244)  Complications: No complications documented.

## 2020-05-11 NOTE — Anesthesia Postprocedure Evaluation (Signed)
Anesthesia Post Note  Patient: Debbie Allen  Procedure(s) Performed: OPEN REDUCTION INTERNAL FIXATION (ORIF) ANKLE FRACTURE (Left Ankle)     Patient location during evaluation: PACU Anesthesia Type: General Level of consciousness: awake and alert Pain management: pain level controlled Vital Signs Assessment: post-procedure vital signs reviewed and stable Respiratory status: spontaneous breathing, nonlabored ventilation, respiratory function stable and patient connected to nasal cannula oxygen Cardiovascular status: blood pressure returned to baseline and stable Postop Assessment: no apparent nausea or vomiting Anesthetic complications: no   No complications documented.  Last Vitals:  Vitals:   05/11/20 1325 05/11/20 1350  BP: 133/74 137/74  Pulse: 64 (!) 57  Resp: 12   Temp: 37.1 C 36.6 C  SpO2: 94%     Last Pain:  Vitals:   05/11/20 1500  TempSrc:   PainSc: [place] Heinz Eckert

## 2020-05-11 NOTE — Progress Notes (Signed)
PROGRESS NOTE                                                                                                                                                                                                             Patient Demographics:    Debbie Allen, is a 54 y.o. female, DOB - June 26, 1966, EGB:[id number]  Outpatient Primary MD for the patient is Patient, No Pcp Per    LOS - 11  Admit date - 04/30/2020    Chief Complaint  Patient presents with  . Shortness of Breath       Brief Narrative - Debbie Allen a 54 y.o.femalewith medical history significant ofparoxysmal supraventricular tachycardia, acid reflux, hyperlipidemia presents to emergency department with worsening cough and shortness of breath.  She came to the hospital after she incurred a syncopal episode and injured her left ankle, and the hospital she was diagnosed with left ankle fracture, of note she was recently diagnosed with COVID-19 infection prior to her hospitalization on 04/22/2020 at PCP office.  She was also developing some shortness of breath along with generalized weakness, this most likely caused her syncopal episode.  He was admitted for acute hypoxic respiratory failure due to COVID-19 pneumonia along with syncope causing fall and left ankle fracture.    Subjective:   Patient in bed, appears comfortable, denies any headache, no fever, no chest pain or pressure, no shortness of breath , no abdominal pain. No focal weakness.    Assessment  & Plan :     1. Acute Hypoxic Resp. Failure due to Acute Covid 19 Viral Pneumonitis during the ongoing 2020 Covid 19 Pandemic - she moderate disease and was initially on 15 L nasal cannula oxygen, she has been treated with combination of IV steroids, remdesivir and Baricitinib, she has shown considerable improvement and now at rest she is symptom-free and on room air. Will continue monitoring inflammatory  markers.  Encouraged the patient to sit up in chair in the daytime use I-S and flutter valve for pulmonary toiletry and then prone in bed when at night.  Will advance activity and titrate down oxygen as possible.    Recent Labs  Lab 05/06/20 0309 05/06/20 0309 05/07/20 0434 05/08/20 1029 05/09/20 1335 05/09/20 1336 05/09/20 1814 05/10/20 1530 05/11/20 0132 05/11/20 0138  WBC 11.2*  --  9.9  --  10.7*  --   --  8.0  --  7.3  PLT 356  --  394  --  593*  --   --  609*  --  529*  CRP 17.1*  --  13.1*  --   --   --  3.3* 2.4*  --  1.3*  BNP  --   --   --   --   --  76.7  --  71.3 56.8  --   DDIMER 0.74*   < > 0.83* 1.00*  --   --  0.92* 1.09*  --  0.81*  AST 36  --  59*  --  175*  --   --  97*  --  63*  ALT 52*  --  70*  --  279*  --   --  229*  --  187*  ALKPHOS 85  --  101  --  189*  --   --  178*  --  155*  BILITOT 0.6  --  0.5  --  0.7  --   --  0.6  --  0.6  ALBUMIN 2.6*  --  2.5*  --  3.0*  --   --  3.1*  --  2.8*  INR  --   --   --  1.0  --   --   --   --   --   --    < > = values in this interval not displayed.    2.  Decreased oral intake, generalized weakness, hypotension and syncope.  All due to COVID-19 infection, blood pressure is improved after hydration.  Stable echocardiogram which appears nonacute.  3.  Fall with left ankle fracture.  Reduced and splinted in the ER on 04/27/2020, she has lateral and medial malleolus fractures.  Orthopedic on board.  I have communicated to orthopedic surgery the patient is okay to proceed for surgery from medical standpoint on 05/10/20.  She is mild to moderate risk for adverse cardiopulmonary outcome, she and her husband know this and want to proceed with surgery is most likely to happen on 05/10/2020.  4.  Mild transaminitis.  Asymptomatic due to COVID-19 viral infection.  Monitor trend.  5.  PSVT.  Due to dehydration, currently on low-dose metoprolol continue.  Has been adequately hydrated.  6.  Mild intermittent asthma.  Stable  continue supportive care.  No wheezing.  7.  Dyslipidemia.  On statin.   Condition - Fair  Family Communication  :  Husband Merry Proud [phone] on 05/08/2020, 05/10/20 -  message left at 11:35 AM  Code Status :  Full  Consults  :  Ortho  Procedures  :    TTE - 1. Left ventricular ejection fraction, by estimation, is 55 to 60%. The left ventricle has normal function. The left ventricle has no regional wall motion abnormalities. Left ventricular diastolic parameters are consistent with Grade II diastolic dysfunction (pseudonormalization).  2. Right ventricular systolic function is normal. The right ventricular size is normal. There is normal pulmonary artery systolic pressure. The estimated right ventricular systolic pressure is 02.5 mmHg.  3. The mitral valve is normal in structure. Trivial mitral valve regurgitation. No evidence of mitral stenosis.  4. The aortic valve is tricuspid. Aortic valve regurgitation is mild. No aortic stenosis is present.  5. The inferior vena cava is normal in size with greater than 50% respiratory variability, suggesting right atrial pressure of 3 mmHg.  PUD Prophylaxis : PPI  Disposition Plan  :  Status is: Inpatient  Remains inpatient appropriate because:IV treatments appropriate due to intensity of illness or inability to take PO   Dispo: The patient is from: Home              Anticipated d/c is to: Home              Anticipated d/c date is: > 3 days              Patient currently is not medically stable to d/c.  DVT Prophylaxis  :  Lovenox   Lab Results  Component Value Date   PLT 529 (H) 05/11/2020    Diet :  Diet Order            Diet NPO time specified  Diet effective midnight                  Inpatient Medications  Scheduled Meds: . AeroChamber Plus Flo-Vu Medium  1 each Other Once  . aspirin EC  81 mg Oral Daily  . baricitinib  4 mg Oral Daily  . enoxaparin (LOVENOX) injection  40 mg Subcutaneous Q24H  . feeding supplement  (ENSURE ENLIVE)  237 mL Oral BID BM  . methylPREDNISolone (SOLU-MEDROL) injection  60 mg Intravenous Daily  . metoprolol tartrate  12.5 mg Oral BID  . midodrine  2.5 mg Oral BID WC  . mometasone-formoterol  2 puff Inhalation BID  . montelukast  10 mg Oral Daily  . pantoprazole  40 mg Oral Daily  . rosuvastatin  20 mg Oral Daily  . senna  2 tablet Oral Daily   Continuous Infusions: PRN Meds:.acetaminophen, albuterol, alum & mag hydroxide-simeth, bisacodyl, diphenhydrAMINE, guaiFENesin-dextromethorphan, menthol-cetylpyridinium, [DISCONTINUED] ondansetron **OR** ondansetron (ZOFRAN) IV, polyethylene glycol, sodium chloride, sodium phosphate  Antibiotics  :    Anti-infectives (From admission, onward)   Start     Dose/Rate Route Frequency Ordered Stop   05/01/20 1000  remdesivir 100 mg in sodium chloride 0.9 % 100 mL IVPB       "Followed by" Linked Group Details   100 mg 200 mL/hr over 30 Minutes Intravenous Daily 04/30/20 1444 05/04/20 0944   04/30/20 1600  remdesivir 200 mg in sodium chloride 0.9% 250 mL IVPB       "Followed by" Linked Group Details   200 mg 580 mL/hr over 30 Minutes Intravenous Once 04/30/20 1444 04/30/20 2110       Time Spent in minutes  30   Lala Lund M.D on 05/11/2020 at 11:39 AM  To page go to [email or web address] - password [facility]  [facility] -  Office  [phone]    See all Orders from today for further details    Objective:   Vitals:   05/10/20 0753 05/10/20 1536 05/10/20 1949 05/11/20 0818  BP: (!) 142/75 (!) 93/58 122/70 134/77  Pulse: (!) 55 69 64 (!) 59  Resp: 16 16 14    Temp: 97.7 F (36.5 C) 98.1 F (36.7 C) 98 F (36.7 C) 98.2 F (36.8 C)  TempSrc:      SpO2: 97% 94% 96% 93%  Weight:      Height:        Wt Readings from Last 3 Encounters:  05/01/20 80.7 kg  04/27/20 80.7 kg  12/06/19 82.6 kg     Intake/Output Summary (Last 24 hours) at 05/11/2020 1139 Last data filed at 05/11/2020 0055 Gross per 24 hour  Intake --   Output 1400 ml  Net -1400 ml     Physical Exam  Awake Alert, No new F.N deficits, Normal affect [place].AT,PERRAL Supple Neck,No JVD, No cervical lymphadenopathy appriciated.  Symmetrical Chest wall movement, Good air movement bilaterally, CTAB RRR,No Gallops, Rubs or new Murmurs, No Parasternal Heave +ve B.Sounds, Abd Soft, No tenderness, No organomegaly appriciated, No rebound - guarding or rigidity. No Cyanosis, left ankle under bandage    Data Review:    CBC Recent Labs  Lab 05/05/20 0301 05/05/20 0301 05/06/20 0309 05/07/20 0434 05/09/20 1335 05/10/20 1530 05/11/20 0138  WBC 13.7*   < > 11.2* 9.9 10.7* 8.0 7.3  HGB 11.1*   < > 11.4* 10.7* 11.6* 11.5* 10.6*  HCT 34.5*   < > 35.5* 33.4* 36.4 36.2 32.6*  PLT 349   < > 356 394 593* 609* 529*  MCV 85.0   < > 86.4 86.1 86.7 87.7 85.6  MCH 27.3   < > 27.7 27.6 27.6 27.8 27.8  MCHC 32.2   < > 32.1 32.0 31.9 31.8 32.5  RDW 13.2   < > 13.3 13.2 13.4 13.3 13.2  LYMPHSABS 1.0  --   --   --   --  0.5* 0.7  MONOABS 0.8  --   --   --   --  0.2 0.7  EOSABS 0.1  --   --   --   --  0.0 0.0  BASOSABS 0.0  --   --   --   --  0.0 0.0   < > = values in this interval not displayed.    Recent Labs  Lab 05/05/20 0301 05/05/20 0301 05/06/20 0309 05/06/20 0309 05/07/20 0434 05/08/20 1029 05/09/20 1335 05/09/20 1336 05/09/20 1814 05/10/20 1530 05/11/20 0132 05/11/20 0138  NA 136   < > 137  --  134*  --  141  --   --  137  --  137  K 3.4*   < > 3.8  --  3.6  --  3.7  --   --  3.8  --  4.2  CL 99   < > 102  --  98  --  105  --   --  100  --  101  CO2 26   < > 24  --  25  --  22  --   --  24  --  27  GLUCOSE 117*   < > 112*  --  114*  --  115*  --   --  118*  --  116*  BUN 14   < > 15  --  11  --  19  --   --  22*  --  17  CREATININE 0.60   < > 0.72  --  0.67  --  0.78  --   --  1.05*  --  0.83  CALCIUM 8.3*   < > 8.2*  --  8.2*  --  9.0  --   --  9.0  --  8.7*  AST 38   < > 36  --  59*  --  175*  --   --  97*  --  63*  ALT 59*    < > 52*  --  70*  --  279*  --   --  229*  --  187*  ALKPHOS 83   < > 85  --  101  --  189*  --   --  178*  --  155*  BILITOT 0.5   < > 0.6  --  0.5  --  0.7  --   --  0.6  --  0.6  ALBUMIN 2.6*   < > 2.6*  --  2.5*  --  3.0*  --   --  3.1*  --  2.8*  MG 2.1  --   --   --   --   --  2.3  --   --  2.2  --  2.3  CRP  --   --  17.1*  --  13.1*  --   --   --  3.3* 2.4*  --  1.3*  DDIMER 0.93*   < > 0.74*   < > 0.83* 1.00*  --   --  0.92* 1.09*  --  0.81*  INR  --   --   --   --   --  1.0  --   --   --   --   --   --   BNP  --   --   --   --   --   --   --  76.7  --  71.3 56.8  --    < > = values in this interval not displayed.    ------------------------------------------------------------------------------------------------------------------ No results for input(s): CHOL, HDL, LDLCALC, TRIG, CHOLHDL, LDLDIRECT in the last 72 hours.  No results found for: HGBA1C ------------------------------------------------------------------------------------------------------------------ No results for input(s): TSH, T4TOTAL, T3FREE, THYROIDAB in the last 72 hours.  Invalid input(s): FREET3  Cardiac Enzymes No results for input(s): CKMB, TROPONINI, MYOGLOBIN in the last 168 hours.  Invalid input(s): CK ------------------------------------------------------------------------------------------------------------------    Component Value Date/Time   BNP 56.8 05/11/2020 0132    Micro Results No results found for this or any previous visit (from the past 240 hour(s)).  Radiology Reports DG Ankle Complete Left  Result Date: 04/27/2020 CLINICAL DATA:  The patient suffered left medial and lateral malleolar fractures in a fall today. Post reduction imaging. Initial encounter. EXAM: LEFT ANKLE COMPLETE - 3+ VIEW COMPARISON:  Plain films of the left ankle earlier today. FINDINGS: The patient is now in a fiberglass cast. Position and alignment of the patient's medial and lateral malleolar fractures are  markedly improved. No new abnormality. IMPRESSION: Marked improvement in position and alignment of medial and lateral malleolar fractures. No new abnormality. Electronically Signed   By: Inge Rise M.D.   On: 04/27/2020 15:24   DG Ankle Complete Left  Result Date: 04/27/2020 CLINICAL DATA:  The patient suffered a left ankle injury in a fall today. Initial encounter. EXAM: LEFT ANKLE COMPLETE - 3+ VIEW COMPARISON:  None. FINDINGS: The patient has acute medial and lateral malleolar fractures. Both fractures are laterally displaced approximately 1 shaft width. The talus is laterally subluxed 1.1 cm. No other acute bony or joint abnormality is identified. There is soft tissue swelling about the patient's fractures. Pins for hallux valgus repair in the distal first metatarsal noted. IMPRESSION: Acute, displaced medial and lateral malleolar fractures as described. Electronically Signed   By: Inge Rise M.D.   On: 04/27/2020 14:11   DG Chest Port 1 View  Result Date: 05/08/2020 CLINICAL DATA:  Shortness of breath.  Recent COVID diagnosis EXAM: PORTABLE CHEST 1 VIEW COMPARISON:  04/30/2020 FINDINGS: Stable cardiomediastinal contours. No pleural effusion or edema. Interval increase in peripheral interstitial and airspace densities within the right upper lobe and right lower lobe as well as the left midlung and left base. IMPRESSION: Interval increase in bilateral pulmonary opacities compatible with progression of multifocal infection. Electronically Signed   By: Lovena Le  Clovis Riley M.D.   On: 05/08/2020 11:44   DG Chest Port 1 View  Result Date: 04/30/2020 CLINICAL DATA:  Cough and shortness of breath. Recent COVID diagnosis. EXAM: PORTABLE CHEST 1 VIEW COMPARISON:  Chest x-ray dated May 15, 2016. FINDINGS: The heart size and mediastinal contours are within normal limits. Streaky linear and reticulonodular interstitial thickening and opacity at both lung bases. No focal consolidation, pleural effusion,  or pneumothorax. No acute osseous abnormality. IMPRESSION: 1. Bilateral lower lobe interstitial and airspace disease, consistent with atypical infection/viral pneumonia. Electronically Signed   By: Titus Dubin M.D.   On: 04/30/2020 12:23   DG MINI C-ARM IMAGE ONLY  Result Date: 05/11/2020 There is no interpretation for this exam.  This order is for images obtained during a surgical procedure.  Please See "Surgeries" Tab for more information regarding the procedure.   ECHOCARDIOGRAM LIMITED  Result Date: 05/07/2020    ECHOCARDIOGRAM LIMITED REPORT   Patient Name:   Jynesis S Maute Date of Exam: 05/07/2020 Medical Rec #:  [id number]   Height:       62.0 in Accession #:    [id number]  Weight:       178.0 lb Date of Birth:  Jul 06, 1966  BSA:          1.819 m Patient Age:    48 years    BP:           98/73 mmHg Patient Gender: F           HR:           69 bpm. Exam Location:  Inpatient Procedure: Limited Echo, Limited Color Doppler and Cardiac Doppler Indications:    Hypotension  History:        Patient has prior history of Echocardiogram examinations, most                 recent 07/05/2015. Covid-19 Positive; Aortic Valve Disease.  Sonographer:    Mikki Santee RDCS (AE) Referring Phys: [id number] [address] [place] IMPRESSIONS  1. Left ventricular ejection fraction, by estimation, is 55 to 60%. The left ventricle has normal function. The left ventricle has no regional wall motion abnormalities. Left ventricular diastolic parameters are consistent with Grade II diastolic dysfunction (pseudonormalization).  2. Right ventricular systolic function is normal. The right ventricular size is normal. There is normal pulmonary artery systolic pressure. The estimated right ventricular systolic pressure is 22.6 mmHg.  3. The mitral valve is normal in structure. Trivial mitral valve regurgitation. No evidence of mitral stenosis.  4. The aortic valve is tricuspid. Aortic valve regurgitation is mild. No aortic stenosis is present.   5. The inferior vena cava is normal in size with greater than 50% respiratory variability, suggesting right atrial pressure of 3 mmHg. FINDINGS  Left Ventricle: Left ventricular ejection fraction, by estimation, is 55 to 60%. The left ventricle has normal function. The left ventricle has no regional wall motion abnormalities. The left ventricular internal cavity size was normal in size. There is  no left ventricular hypertrophy. Right Ventricle: The right ventricular size is normal. No increase in right ventricular wall thickness. Right ventricular systolic function is normal. There is normal pulmonary artery systolic pressure. The tricuspid regurgitant velocity is 2.19 m/s, and  with an assumed right atrial pressure of 3 mmHg, the estimated right ventricular systolic pressure is 33.3 mmHg. Left Atrium: Left atrial size was normal in size. Right Atrium: Right atrial size was normal in size. Mitral Valve: The mitral valve is normal in  structure. Trivial mitral valve regurgitation. No evidence of mitral valve stenosis. Tricuspid Valve: The tricuspid valve is normal in structure. Tricuspid valve regurgitation is trivial. Aortic Valve: The aortic valve is tricuspid. Aortic valve regurgitation is mild. Aortic regurgitation PHT measures 750 msec. No aortic stenosis is present. Aorta: The aortic root is normal in size and structure. Venous: The inferior vena cava is normal in size with greater than 50% respiratory variability, suggesting right atrial pressure of 3 mmHg. IAS/Shunts: No atrial level shunt detected by color flow Doppler. LEFT VENTRICLE PLAX 2D LVIDd:         4.43 cm  Diastology LVIDs:         3.30 cm  LV e' lateral:   7.29 cm/s LV PW:         0.90 cm  LV E/e' lateral: 11.6 LV IVS:        0.86 cm  LV e' medial:    6.64 cm/s LVOT diam:     1.80 cm  LV E/e' medial:  12.8 LVOT Area:     2.54 cm  LEFT ATRIUM             Index       RIGHT ATRIUM           Index LA diam:        3.40 cm 1.87 cm/m  RA Area:      14.20 cm LA Vol (A2C):   46.3 ml 25.45 ml/m RA Volume:   32.50 ml  17.86 ml/m LA Vol (A4C):   39.1 ml 21.49 ml/m LA Biplane Vol: 47.0 ml 25.83 ml/m  AORTIC VALVE AI PHT:      750 msec  AORTA Ao Root diam: 3.00 cm MITRAL VALVE               TRICUSPID VALVE MV Area (PHT): 2.91 cm    TR Peak grad:   19.2 mmHg MV Decel Time: 261 msec    TR Vmax:        219.00 cm/s MV E velocity: 84.80 cm/s MV A velocity: 80.60 cm/s  SHUNTS MV E/A ratio:  1.05        Systemic Diam: 1.80 cm Loralie Champagne MD Electronically signed by Loralie Champagne MD Signature Date/Time: 05/07/2020/4:46:48 PM    Final

## 2020-05-11 NOTE — Anesthesia Preprocedure Evaluation (Addendum)
Anesthesia Evaluation  Patient identified by MRN, date of birth, ID band Patient awake    Reviewed: Allergy & Precautions, NPO status , Patient's Chart, lab work & pertinent test results, reviewed documented beta blocker date and time   Airway Mallampati: II  TM Distance: >3 FB Neck ROM: Full    Dental  (+) Teeth Intact, Dental Advisory Given   Pulmonary asthma ,     + decreased breath sounds      Cardiovascular + CAD   Rhythm:Regular Rate:Normal     Neuro/Psych negative neurological ROS  negative psych ROS   GI/Hepatic Neg liver ROS, GERD  Medicated,  Endo/Other  negative endocrine ROS  Renal/GU negative Renal ROS     Musculoskeletal negative musculoskeletal ROS (+)   Abdominal (+) + obese,   Peds  Hematology negative hematology ROS (+)   Anesthesia Other Findings   Reproductive/Obstetrics                            Anesthesia Physical Anesthesia Plan  ASA: II  Anesthesia Plan: General   Post-op Pain Management:    Induction: Intravenous, Rapid sequence and Cricoid pressure planned  PONV Risk Score and Plan: 4 or greater and Ondansetron, Dexamethasone, Midazolam and Scopolamine patch - Pre-op  Airway Management Planned: Oral ETT and Video Laryngoscope Planned  Additional Equipment: None  Intra-op Plan:   Post-operative Plan: Extubation in OR  Informed Consent: I have reviewed the patients History and Physical, chart, labs and discussed the procedure including the risks, benefits and alternatives for the proposed anesthesia with the patient or authorized representative who has indicated his/her understanding and acceptance.     Dental advisory given  Plan Discussed with: CRNA  Anesthesia Plan Comments:        Anesthesia Quick Evaluation

## 2020-05-12 LAB — COMPREHENSIVE METABOLIC PANEL
ALT: 129 U/L — ABNORMAL HIGH (ref 0–44)
AST: 35 U/L (ref 15–41)
Albumin: 2.7 g/dL — ABNORMAL LOW (ref 3.5–5.0)
Alkaline Phosphatase: 136 U/L — ABNORMAL HIGH (ref 38–126)
Anion gap: 8 (ref 5–15)
BUN: 17 mg/dL (ref 6–20)
CO2: 29 mmol/L (ref 22–32)
Calcium: 8.7 mg/dL — ABNORMAL LOW (ref 8.9–10.3)
Chloride: 102 mmol/L (ref 98–111)
Creatinine, Ser: 0.98 mg/dL (ref 0.44–1.00)
GFR calc Af Amer: >60 mL/min (ref >60)
GFR calc non Af Amer: >60 mL/min (ref >60)
Glucose, Bld: 101 mg/dL — ABNORMAL HIGH (ref 70–99)
Potassium: 3.9 mmol/L (ref 3.5–5.1)
Sodium: 139 mmol/L (ref 135–145)
Total Bilirubin: 0.7 mg/dL (ref 0.3–1.2)
Total Protein: 5.9 g/dL — ABNORMAL LOW (ref 6.5–8.1)

## 2020-05-12 LAB — CBC WITH DIFFERENTIAL/PLATELET
Abs Immature Granulocytes: 0.17 10*3/uL — ABNORMAL HIGH (ref 0.00–0.07)
Basophils Absolute: 0 10*3/uL (ref 0.0–0.1)
Basophils Relative: 0 %
Eosinophils Absolute: 0 10*3/uL (ref 0.0–0.5)
Eosinophils Relative: 0 %
HCT: 34.2 % — ABNORMAL LOW (ref 36.0–46.0)
Hemoglobin: 10.9 g/dL — ABNORMAL LOW (ref 12.0–15.0)
Immature Granulocytes: 2 %
Lymphocytes Relative: 10 %
Lymphs Abs: 1.1 10*3/uL (ref 0.7–4.0)
MCH: 27.5 pg (ref 26.0–34.0)
MCHC: 31.9 g/dL (ref 30.0–36.0)
MCV: 86.4 fL (ref 80.0–100.0)
Monocytes Absolute: 1.1 10*3/uL — ABNORMAL HIGH (ref 0.1–1.0)
Monocytes Relative: 9 %
Neutro Abs: 9.1 10*3/uL — ABNORMAL HIGH (ref 1.7–7.7)
Neutrophils Relative %: 79 %
Platelets: 555 10*3/uL — ABNORMAL HIGH (ref 150–400)
RBC: 3.96 MIL/uL (ref 3.87–5.11)
RDW: 13.2 % (ref 11.5–15.5)
WBC: 11.5 10*3/uL — ABNORMAL HIGH (ref 4.0–10.5)
nRBC: 0 % (ref 0.0–0.2)

## 2020-05-12 MED ORDER — MORPHINE SULFATE ER 15 MG PO TBCR
15.0000 mg | EXTENDED_RELEASE_TABLET | Freq: Two times a day (BID) | ORAL | Status: DC
  Administered 2020-05-12 – 2020-05-13 (×3): 15 mg via ORAL
  Filled 2020-05-12 (×3): qty 1

## 2020-05-12 MED ORDER — METOCLOPRAMIDE HCL 5 MG/ML IJ SOLN: 5.0000 mg | Freq: Three times a day (TID) | INTRAMUSCULAR | Status: DC | PRN

## 2020-05-12 MED ORDER — METOCLOPRAMIDE HCL 5 MG PO TABS: 5.0000 mg | ORAL_TABLET | Freq: Three times a day (TID) | ORAL | Status: DC | PRN

## 2020-05-12 MED ORDER — DOCUSATE SODIUM 100 MG PO CAPS
100.0000 mg | ORAL_CAPSULE | Freq: Two times a day (BID) | ORAL | Status: DC
  Administered 2020-05-12 – 2020-05-14 (×5): 100 mg via ORAL
  Filled 2020-05-12 (×5): qty 1

## 2020-05-12 MED ORDER — ONDANSETRON HCL 4 MG/2ML IJ SOLN
4.0000 mg | Freq: Four times a day (QID) | INTRAMUSCULAR | Status: DC | PRN
  Administered 2020-05-14: 4 mg via INTRAVENOUS
  Filled 2020-05-12 (×2): qty 2

## 2020-05-12 MED ORDER — ONDANSETRON HCL 4 MG PO TABS: 4.0000 mg | ORAL_TABLET | Freq: Four times a day (QID) | ORAL | Status: DC | PRN

## 2020-05-12 MED ORDER — ENOXAPARIN SODIUM 40 MG/0.4ML ~~LOC~~ SOLN: 40.0000 mg | SUBCUTANEOUS | Status: DC

## 2020-05-12 MED ORDER — METHYLPREDNISOLONE SODIUM SUCC 40 MG IJ SOLR
20.0000 mg | Freq: Every day | INTRAMUSCULAR | Status: DC
  Administered 2020-05-12: 20 mg via INTRAVENOUS
  Filled 2020-05-12: qty 1

## 2020-05-12 NOTE — Evaluation (Signed)
Physical Therapy Evaluation Patient Details Name: Debbie Allen MRN: [id number] DOB: April 09, 1966 Today's Date: 05/12/2020   History of Present Illness  54 yo female diagnosed with Covid 19 8/16 at PCP office with Syncope and visit to ED on 8/21 with left ankle fx s/p reduction and D/C. Pt returned 8/24 and admitted with hypotension, respiratory failure due to Covid. Pt s/p ORIF left ankle 9/4. PMhx: HLD, PSVT  Clinical Impression  Pt very eager to get OOB to bathroom as purewick full. Pt with report of 3/10 pain in left ankle with noted throbbing and SpO2 drop to 88% on RA with activity. Pt with decreased gait and mobility limited by fatigue who will benefit from acute therapy to maximize mobility, safety and function to decrease burden of care. Pt educated for need to mobilize to toilet with nursing, HEP and continued progression.   HR 83 SpO2 88-92% on RA    Follow Up Recommendations Home health PT    Equipment Recommendations  None recommended by PT    Recommendations for Other Services       Precautions / Restrictions Precautions Precautions: Fall Restrictions LLE Weight Bearing: Non weight bearing      Mobility  Bed Mobility Overal bed mobility: Modified Independent             General bed mobility comments: HOB 40 degrees with rail  Transfers Overall transfer level: Needs assistance   Transfers: Sit to/from Stand Sit to Stand: Min guard         General transfer comment: cues for hand placement and safety to rise from bed and toilet with BSC  Ambulation/Gait Ambulation/Gait assistance: Min guard Gait Distance (Feet): 15 Feet Assistive device: Rolling walker (2 wheeled) Gait Pattern/deviations: Step-to pattern   Gait velocity interpretation: 1.31 - 2.62 ft/sec, indicative of limited community ambulator General Gait Details: cues for safety with pt able to hop 15' to and from bathroom without physical assist. SpO2 88-92% on RA with cues for breathing  technique, limited by fatigue  Stairs            Wheelchair Mobility    Modified Rankin (Stroke Patients Only)       Balance Overall balance assessment: Mild deficits observed, not formally tested                                           Pertinent Vitals/Pain Pain Assessment: 0-10 Pain Score: 3  Pain Location: left ankle Pain Descriptors / Indicators: Aching;Guarding Pain Intervention(s): Limited activity within patient's tolerance;Monitored during session;Repositioned    Home Living Family/patient expects to be discharged to:: Private residence Living Arrangements: Spouse/significant other Available Help at Discharge: Family;Available 24 hours/day Type of Home: House Home Access: Stairs to enter Entrance Stairs-Rails: Psychiatric nurse of Steps: 4 Home Layout: Two level;Able to live on main level with bedroom/bathroom Home Equipment: Gilford Rile - 2 wheels;Bedside commode      Prior Function Level of Independence: Independent               Hand Dominance        Extremity/Trunk Assessment   Upper Extremity Assessment Upper Extremity Assessment: Overall WFL for tasks assessed    Lower Extremity Assessment Lower Extremity Assessment: Overall WFL for tasks assessed    Cervical / Trunk Assessment Cervical / Trunk Assessment: Normal  Communication   Communication: No difficulties  Cognition Arousal/Alertness: Awake/alert Behavior During  Therapy: WFL for tasks assessed/performed Overall Cognitive Status: Within Functional Limits for tasks assessed                                        General Comments      Exercises General Exercises - Lower Extremity Long Arc Quad: AROM;Left;Seated;10 reps Hip Flexion/Marching: AROM;Left;Seated;10 reps   Assessment/Plan    PT Assessment Patient needs continued PT services  PT Problem List Decreased strength;Decreased mobility;Decreased activity  tolerance;Decreased balance;Decreased knowledge of use of DME;Pain;Cardiopulmonary status limiting activity       PT Treatment Interventions DME instruction;Gait training;Stair training;Functional mobility training;Therapeutic activities;Patient/family education;Therapeutic exercise    PT Goals (Current goals can be found in the Care Plan section)  Acute Rehab PT Goals Patient Stated Goal: return home PT Goal Formulation: With patient Time For Goal Achievement: 05/26/20 Potential to Achieve Goals: Good    Frequency Min 5X/week   Barriers to discharge        Co-evaluation               AM-PAC PT "6 Clicks" Mobility  Outcome Measure Help needed turning from your back to your side while in a flat bed without using bedrails?: A Little Help needed moving from lying on your back to sitting on the side of a flat bed without using bedrails?: A Little Help needed moving to and from a bed to a chair (including a wheelchair)?: A Little Help needed standing up from a chair using your arms (e.g., wheelchair or bedside chair)?: A Little Help needed to walk in hospital room?: A Little Help needed climbing 3-5 steps with a railing? : A Lot 6 Click Score: 17    End of Session Equipment Utilized During Treatment: Gait belt Activity Tolerance: Patient tolerated treatment well Patient left: in chair;with call bell/phone within reach;with chair alarm set Nurse Communication: Mobility status PT Visit Diagnosis: Other abnormalities of gait and mobility (R26.89);Muscle weakness (generalized) (M62.81)    Time: 1694-5038 PT Time Calculation (min) (ACUTE ONLY): 26 min   Charges:   PT Evaluation $PT Eval Moderate Complexity: 1 Mod PT Treatments $Therapeutic Activity: 8-22 mins        Alyn Jurney P, PT Acute Rehabilitation Services Pager: [phone] Office: [place] [facility] Amm[facility] Warrick 05/12/2020, 1:58 PM

## 2020-05-12 NOTE — Progress Notes (Signed)
PROGRESS NOTE                                                                                                                                                                                                             Patient Demographics:    Debbie Allen, is a 54 y.o. female, DOB - [birth year], EGB:[id number]  Outpatient Primary MD for the patient is Patient, No Pcp Per    LOS - 12  Admit date - 04/30/2020    Chief Complaint  Patient presents with  . Shortness of Breath       Brief Narrative - Debbie Allen a 54 y.o.femalewith medical history significant ofparoxysmal supraventricular tachycardia, acid reflux, hyperlipidemia presents to emergency department with worsening cough and shortness of breath.  She came to the hospital after she incurred a syncopal episode and injured her left ankle, and the hospital she was diagnosed with left ankle fracture, of note she was recently diagnosed with COVID-19 infection prior to her hospitalization on 04/22/2020 at PCP office.  She was also developing some shortness of breath along with generalized weakness, this most likely caused her syncopal episode.  He was admitted for acute hypoxic respiratory failure due to COVID-19 pneumonia along with syncope causing fall and left ankle fracture.    Subjective:   Patient in bed, appears comfortable, denies any headache, no fever, no chest pain or pressure, no shortness of breath , no abdominal pain. No focal weakness.   Assessment  & Plan :     1. Acute Hypoxic Resp. Failure due to Acute Covid 19 Viral Pneumonitis during the ongoing 2020 Covid 19 Pandemic - she moderate disease and was initially on 15 L nasal cannula oxygen, she has been treated with combination of IV steroids, remdesivir and Baricitinib, she has shown considerable improvement and now at rest she is symptom-free and on room air. Will continue monitoring inflammatory  markers.  Encouraged the patient to sit up in chair in the daytime use I-S and flutter valve for pulmonary toiletry and then prone in bed when at night.  Will advance activity and titrate down oxygen as possible.    Recent Labs  Lab 05/06/20 0309 05/06/20 0309 05/07/20 0434 05/08/20 1029 05/09/20 1335 05/09/20 1336 05/09/20 1814 05/10/20 1530 05/11/20 0132 05/11/20 0138 05/12/20 0458  WBC 11.2*   < > 9.9  --  10.7*  --   --  8.0  --  7.3 11.5*  PLT 356   < > 394  --  593*  --   --  609*  --  529* 555*  CRP 17.1*  --  13.1*  --   --   --  3.3* 2.4*  --  1.3*  --   BNP  --   --   --   --   --  76.7  --  71.3 56.8  --   --   DDIMER 0.74*   < > 0.83* 1.00*  --   --  0.92* 1.09*  --  0.81*  --   AST 36   < > 59*  --  175*  --   --  97*  --  63* 35  ALT 52*   < > 70*  --  279*  --   --  229*  --  187* 129*  ALKPHOS 85   < > 101  --  189*  --   --  178*  --  155* 136*  BILITOT 0.6   < > 0.5  --  0.7  --   --  0.6  --  0.6 0.7  ALBUMIN 2.6*   < > 2.5*  --  3.0*  --   --  3.1*  --  2.8* 2.7*  INR  --   --   --  1.0  --   --   --   --   --   --   --    < > = values in this interval not displayed.    2.  Decreased oral intake, generalized weakness, hypotension and syncope.  All due to COVID-19 infection, blood pressure is improved after hydration.  Stable echocardiogram which appears nonacute.  3.  Fall with left ankle fracture.  Reduced and splinted in the ER on 04/27/2020, she has lateral and medial malleolus fractures.  Orthopedic on board.  She underwent open reduction internal fixation of her fracture on 05/11/2020 by Dr. Victorino December, no weightbearing in left leg for at least 6 weeks then as per orthopedics, Lovenox for now for DVT prophylaxis.  Commence PT OT, may require SNF.  4.  Mild transaminitis.  Asymptomatic due to COVID-19 viral infection.  Monitor trend.  5.  PSVT.  Due to dehydration, currently on low-dose metoprolol continue.  Has been adequately hydrated.  6.  Mild  intermittent asthma.  Stable continue supportive care.  No wheezing.  7.  Dyslipidemia.  On statin.    Condition - Fair  Family Communication  :  Husband Merry Proud [phone] on 05/08/2020, 05/10/20 -  message left at 11:35 AM, on 05/12/2020  Code Status :  Full  Consults  :  Ortho  Procedures  :    Open treatment of left ankle fracture with internal fixation. Bimalleolar with application of short leg splint left ankle. SURGEON: Jason P. Stann Mainland, M.D. on 05/11/20    TTE - 1. Left ventricular ejection fraction, by estimation, is 55 to 60%. The left ventricle has normal function. The left ventricle has no regional wall motion abnormalities. Left ventricular diastolic parameters are consistent with Grade II diastolic dysfunction (pseudonormalization).  2. Right ventricular systolic function is normal. The right ventricular size is normal. There is normal pulmonary artery systolic pressure. The estimated right ventricular systolic pressure is 44.0 mmHg.  3. The mitral valve is normal in structure. Trivial mitral valve regurgitation. No evidence of mitral stenosis.  4.  The aortic valve is tricuspid. Aortic valve regurgitation is mild. No aortic stenosis is present.  5. The inferior vena cava is normal in size with greater than 50% respiratory variability, suggesting right atrial pressure of 3 mmHg.  PUD Prophylaxis : PPI  Disposition Plan  :    Status is: Inpatient  Remains inpatient appropriate because:IV treatments appropriate due to intensity of illness or inability to take PO   Dispo: The patient is from: Home              Anticipated d/c is to: Home              Anticipated d/c date is: > 3 days              Patient currently is not medically stable to d/c.  DVT Prophylaxis  :  Lovenox   Lab Results  Component Value Date   PLT 555 (H) 05/12/2020    Diet :  Diet Order            Diet regular Room service appropriate? Yes; Fluid consistency: Thin  Diet effective now                   Inpatient Medications  Scheduled Meds: . AeroChamber Plus Flo-Vu Medium  1 each Other Once  . aspirin EC  81 mg Oral Daily  . baricitinib  4 mg Oral Daily  . docusate sodium  100 mg Oral BID  . enoxaparin (LOVENOX) injection  40 mg Subcutaneous Q24H  . feeding supplement (ENSURE ENLIVE)  237 mL Oral BID BM  . methylPREDNISolone (SOLU-MEDROL) injection  20 mg Intravenous Daily  . metoprolol tartrate  12.5 mg Oral BID  . midodrine  2.5 mg Oral BID WC  . mometasone-formoterol  2 puff Inhalation BID  . montelukast  10 mg Oral Daily  . pantoprazole  40 mg Oral Daily  . rosuvastatin  20 mg Oral Daily  . senna  2 tablet Oral Daily   Continuous Infusions: PRN Meds:.acetaminophen, albuterol, alum & mag hydroxide-simeth, bisacodyl, guaiFENesin-dextromethorphan, menthol-cetylpyridinium, metoCLOPramide **OR** metoCLOPramide (REGLAN) injection, ondansetron **OR** ondansetron (ZOFRAN) IV, polyethylene glycol, sodium chloride, sodium phosphate  Antibiotics  :    Anti-infectives (From admission, onward)   Start     Dose/Rate Route Frequency Ordered Stop   05/01/20 1000  remdesivir 100 mg in sodium chloride 0.9 % 100 mL IVPB       "Followed by" Linked Group Details   100 mg 200 mL/hr over 30 Minutes Intravenous Daily 04/30/20 1444 05/04/20 0944   04/30/20 1600  remdesivir 200 mg in sodium chloride 0.9% 250 mL IVPB       "Followed by" Linked Group Details   200 mg 580 mL/hr over 30 Minutes Intravenous Once 04/30/20 1444 04/30/20 2110       Time Spent in minutes  30   Lala Lund M.D on 05/12/2020 at 9:56 AM  To page go to [email or web address] - password [facility]  [facility] -  Office  [phone]    See all Orders from today for further details    Objective:   Vitals:   05/11/20 1350 05/11/20 1823 05/12/20 0014 05/12/20 0748  BP: 137/74 117/68 (!) 108/94 131/84  Pulse: (!) 57 (!) 51 67 63  Resp:   17 16  Temp: 97.9 F (36.6 C) 97.7 F (36.5 C) 97.7 F (36.5 C)    TempSrc:   Oral   SpO2:  96% 94% 94%  Weight:      Height:  Wt Readings from Last 3 Encounters:  05/01/20 80.7 kg  04/27/20 80.7 kg  12/06/19 82.6 kg     Intake/Output Summary (Last 24 hours) at 05/12/2020 0956 Last data filed at 05/11/2020 2142 Gross per 24 hour  Intake 800 ml  Output 810 ml  Net -10 ml     Physical Exam  Awake Alert, No new F.N deficits, Normal affect [place].AT,PERRAL Supple Neck,No JVD, No cervical lymphadenopathy appriciated.  Symmetrical Chest wall movement, Good air movement bilaterally, CTAB RRR,No Gallops, Rubs or new Murmurs, No Parasternal Heave +ve B.Sounds, Abd Soft, No tenderness, No organomegaly appriciated, No rebound - guarding or rigidity. No Cyanosis,  left ankle under bandage    Data Review:    CBC Recent Labs  Lab 05/07/20 0434 05/09/20 1335 05/10/20 1530 05/11/20 0138 05/12/20 0458  WBC 9.9 10.7* 8.0 7.3 11.5*  HGB 10.7* 11.6* 11.5* 10.6* 10.9*  HCT 33.4* 36.4 36.2 32.6* 34.2*  PLT 394 593* 609* 529* 555*  MCV 86.1 86.7 87.7 85.6 86.4  MCH 27.6 27.6 27.8 27.8 27.5  MCHC 32.0 31.9 31.8 32.5 31.9  RDW 13.2 13.4 13.3 13.2 13.2  LYMPHSABS  --   --  0.5* 0.7 1.1  MONOABS  --   --  0.2 0.7 1.1*  EOSABS  --   --  0.0 0.0 0.0  BASOSABS  --   --  0.0 0.0 0.0    Recent Labs  Lab 05/06/20 0309 05/06/20 0309 05/07/20 0434 05/08/20 1029 05/09/20 1335 05/09/20 1336 05/09/20 1814 05/10/20 1530 05/11/20 0132 05/11/20 0138 05/12/20 0458  NA 137   < > 134*  --  141  --   --  137  --  137 139  K 3.8   < > 3.6  --  3.7  --   --  3.8  --  4.2 3.9  CL 102   < > 98  --  105  --   --  100  --  101 102  CO2 24   < > 25  --  22  --   --  24  --  27 29  GLUCOSE 112*   < > 114*  --  115*  --   --  118*  --  116* 101*  BUN 15   < > 11  --  19  --   --  22*  --  17 17  CREATININE 0.72   < > 0.67  --  0.78  --   --  1.05*  --  0.83 0.98  CALCIUM 8.2*   < > 8.2*  --  9.0  --   --  9.0  --  8.7* 8.7*  AST 36   < > 59*  --  175*  --    --  97*  --  63* 35  ALT 52*   < > 70*  --  279*  --   --  229*  --  187* 129*  ALKPHOS 85   < > 101  --  189*  --   --  178*  --  155* 136*  BILITOT 0.6   < > 0.5  --  0.7  --   --  0.6  --  0.6 0.7  ALBUMIN 2.6*   < > 2.5*  --  3.0*  --   --  3.1*  --  2.8* 2.7*  MG  --   --   --   --  2.3  --   --  2.2  --  2.3  --   CRP 17.1*  --  13.1*  --   --   --  3.3* 2.4*  --  1.3*  --   DDIMER 0.74*   < > 0.83* 1.00*  --   --  0.92* 1.09*  --  0.81*  --   INR  --   --   --  1.0  --   --   --   --   --   --   --   BNP  --   --   --   --   --  76.7  --  71.3 56.8  --   --    < > = values in this interval not displayed.    ------------------------------------------------------------------------------------------------------------------ No results for input(s): CHOL, HDL, LDLCALC, TRIG, CHOLHDL, LDLDIRECT in the last 72 hours.  No results found for: HGBA1C ------------------------------------------------------------------------------------------------------------------ No results for input(s): TSH, T4TOTAL, T3FREE, THYROIDAB in the last 72 hours.  Invalid input(s): FREET3  Cardiac Enzymes No results for input(s): CKMB, TROPONINI, MYOGLOBIN in the last 168 hours.  Invalid input(s): CK ------------------------------------------------------------------------------------------------------------------    Component Value Date/Time   BNP 56.8 05/11/2020 0132    Micro Results No results found for this or any previous visit (from the past 240 hour(s)).  Radiology Reports DG Ankle Complete Left  Result Date: 04/27/2020 CLINICAL DATA:  The patient suffered left medial and lateral malleolar fractures in a fall today. Post reduction imaging. Initial encounter. EXAM: LEFT ANKLE COMPLETE - 3+ VIEW COMPARISON:  Plain films of the left ankle earlier today. FINDINGS: The patient is now in a fiberglass cast. Position and alignment of the patient's medial and lateral malleolar fractures are markedly  improved. No new abnormality. IMPRESSION: Marked improvement in position and alignment of medial and lateral malleolar fractures. No new abnormality. Electronically Signed   By: Inge Rise M.D.   On: 04/27/2020 15:24   DG Ankle Complete Left  Result Date: 04/27/2020 CLINICAL DATA:  The patient suffered a left ankle injury in a fall today. Initial encounter. EXAM: LEFT ANKLE COMPLETE - 3+ VIEW COMPARISON:  None. FINDINGS: The patient has acute medial and lateral malleolar fractures. Both fractures are laterally displaced approximately 1 shaft width. The talus is laterally subluxed 1.1 cm. No other acute bony or joint abnormality is identified. There is soft tissue swelling about the patient's fractures. Pins for hallux valgus repair in the distal first metatarsal noted. IMPRESSION: Acute, displaced medial and lateral malleolar fractures as described. Electronically Signed   By: Inge Rise M.D.   On: 04/27/2020 14:11   DG Chest Port 1 View  Result Date: 05/08/2020 CLINICAL DATA:  Shortness of breath.  Recent COVID diagnosis EXAM: PORTABLE CHEST 1 VIEW COMPARISON:  04/30/2020 FINDINGS: Stable cardiomediastinal contours. No pleural effusion or edema. Interval increase in peripheral interstitial and airspace densities within the right upper lobe and right lower lobe as well as the left midlung and left base. IMPRESSION: Interval increase in bilateral pulmonary opacities compatible with progression of multifocal infection. Electronically Signed   By: Kerby Moors M.D.   On: 05/08/2020 11:44   DG Chest Port 1 View  Result Date: 04/30/2020 CLINICAL DATA:  Cough and shortness of breath. Recent COVID diagnosis. EXAM: PORTABLE CHEST 1 VIEW COMPARISON:  Chest x-ray dated May 15, 2016. FINDINGS: The heart size and mediastinal contours are within normal limits. Streaky linear and reticulonodular interstitial thickening and opacity at both lung bases. No focal consolidation, pleural effusion, or  pneumothorax. No acute osseous abnormality. IMPRESSION: 1. Bilateral lower lobe interstitial and airspace disease, consistent with atypical infection/viral pneumonia. Electronically Signed   By: Titus Dubin M.D.   On: 04/30/2020 12:23   DG MINI C-ARM IMAGE ONLY  Result Date: 05/11/2020 There is no interpretation for this exam.  This order is for images obtained during a surgical procedure.  Please See "Surgeries" Tab for more information regarding the procedure.   ECHOCARDIOGRAM LIMITED  Result Date: 05/07/2020    ECHOCARDIOGRAM LIMITED REPORT   Patient Name:   Rose S Wiederholt Date of Exam: 05/07/2020 Medical Rec #:  [id number]   Height:       62.0 in Accession #:    [id number]  Weight:       178.0 lb Date of Birth:  02/08/1966  BSA:          1.819 m Patient Age:    45 years    BP:           98/73 mmHg Patient Gender: F           HR:           69 bpm. Exam Location:  Inpatient Procedure: Limited Echo, Limited Color Doppler and Cardiac Doppler Indications:    Hypotension  History:        Patient has prior history of Echocardiogram examinations, most                 recent 07/05/2015. Covid-19 Positive; Aortic Valve Disease.  Sonographer:    Mikki Santee RDCS (AE) Referring Phys: [id number] [address] [place] IMPRESSIONS  1. Left ventricular ejection fraction, by estimation, is 55 to 60%. The left ventricle has normal function. The left ventricle has no regional wall motion abnormalities. Left ventricular diastolic parameters are consistent with Grade II diastolic dysfunction (pseudonormalization).  2. Right ventricular systolic function is normal. The right ventricular size is normal. There is normal pulmonary artery systolic pressure. The estimated right ventricular systolic pressure is 82.9 mmHg.  3. The mitral valve is normal in structure. Trivial mitral valve regurgitation. No evidence of mitral stenosis.  4. The aortic valve is tricuspid. Aortic valve regurgitation is mild. No aortic stenosis is present.   5. The inferior vena cava is normal in size with greater than 50% respiratory variability, suggesting right atrial pressure of 3 mmHg. FINDINGS  Left Ventricle: Left ventricular ejection fraction, by estimation, is 55 to 60%. The left ventricle has normal function. The left ventricle has no regional wall motion abnormalities. The left ventricular internal cavity size was normal in size. There is  no left ventricular hypertrophy. Right Ventricle: The right ventricular size is normal. No increase in right ventricular wall thickness. Right ventricular systolic function is normal. There is normal pulmonary artery systolic pressure. The tricuspid regurgitant velocity is 2.19 m/s, and  with an assumed right atrial pressure of 3 mmHg, the estimated right ventricular systolic pressure is 93.7 mmHg. Left Atrium: Left atrial size was normal in size. Right Atrium: Right atrial size was normal in size. Mitral Valve: The mitral valve is normal in structure. Trivial mitral valve regurgitation. No evidence of mitral valve stenosis. Tricuspid Valve: The tricuspid valve is normal in structure. Tricuspid valve regurgitation is trivial. Aortic Valve: The aortic valve is tricuspid. Aortic valve regurgitation is mild. Aortic regurgitation PHT measures 750 msec. No aortic stenosis is present. Aorta: The aortic root is normal in size and structure. Venous: The inferior vena cava is normal in size with greater than 50% respiratory  variability, suggesting right atrial pressure of 3 mmHg. IAS/Shunts: No atrial level shunt detected by color flow Doppler. LEFT VENTRICLE PLAX 2D LVIDd:         4.43 cm  Diastology LVIDs:         3.30 cm  LV e' lateral:   7.29 cm/s LV PW:         0.90 cm  LV E/e' lateral: 11.6 LV IVS:        0.86 cm  LV e' medial:    6.64 cm/s LVOT diam:     1.80 cm  LV E/e' medial:  12.8 LVOT Area:     2.54 cm  LEFT ATRIUM             Index       RIGHT ATRIUM           Index LA diam:        3.40 cm 1.87 cm/m  RA Area:      14.20 cm LA Vol (A2C):   46.3 ml 25.45 ml/m RA Volume:   32.50 ml  17.86 ml/m LA Vol (A4C):   39.1 ml 21.49 ml/m LA Biplane Vol: 47.0 ml 25.83 ml/m  AORTIC VALVE AI PHT:      750 msec  AORTA Ao Root diam: 3.00 cm MITRAL VALVE               TRICUSPID VALVE MV Area (PHT): 2.91 cm    TR Peak grad:   19.2 mmHg MV Decel Time: 261 msec    TR Vmax:        219.00 cm/s MV E velocity: 84.80 cm/s MV A velocity: 80.60 cm/s  SHUNTS MV E/A ratio:  1.05        Systemic Diam: 1.80 cm Loralie Champagne MD Electronically signed by Loralie Champagne MD Signature Date/Time: 05/07/2020/4:46:48 PM    Final

## 2020-05-13 LAB — CBC WITH DIFFERENTIAL/PLATELET
Abs Immature Granulocytes: 0.2 10*3/uL — ABNORMAL HIGH (ref 0.00–0.07)
Basophils Absolute: 0 10*3/uL (ref 0.0–0.1)
Basophils Relative: 0 %
Eosinophils Absolute: 0 10*3/uL (ref 0.0–0.5)
Eosinophils Relative: 0 %
HCT: 33.8 % — ABNORMAL LOW (ref 36.0–46.0)
Hemoglobin: 10.6 g/dL — ABNORMAL LOW (ref 12.0–15.0)
Immature Granulocytes: 2 %
Lymphocytes Relative: 20 %
Lymphs Abs: 1.9 10*3/uL (ref 0.7–4.0)
MCH: 27.6 pg (ref 26.0–34.0)
MCHC: 31.4 g/dL (ref 30.0–36.0)
MCV: 88 fL (ref 80.0–100.0)
Monocytes Absolute: 0.9 10*3/uL (ref 0.1–1.0)
Monocytes Relative: 9 %
Neutro Abs: 6.6 10*3/uL (ref 1.7–7.7)
Neutrophils Relative %: 69 %
Platelets: 520 10*3/uL — ABNORMAL HIGH (ref 150–400)
RBC: 3.84 MIL/uL — ABNORMAL LOW (ref 3.87–5.11)
RDW: 13.6 % (ref 11.5–15.5)
WBC: 9.7 10*3/uL (ref 4.0–10.5)
nRBC: 0 % (ref 0.0–0.2)

## 2020-05-13 LAB — COMPREHENSIVE METABOLIC PANEL
ALT: 93 U/L — ABNORMAL HIGH (ref 0–44)
AST: 27 U/L (ref 15–41)
Albumin: 2.7 g/dL — ABNORMAL LOW (ref 3.5–5.0)
Alkaline Phosphatase: 125 U/L (ref 38–126)
Anion gap: 11 (ref 5–15)
BUN: 17 mg/dL (ref 6–20)
CO2: 25 mmol/L (ref 22–32)
Calcium: 8.2 mg/dL — ABNORMAL LOW (ref 8.9–10.3)
Chloride: 99 mmol/L (ref 98–111)
Creatinine, Ser: 0.99 mg/dL (ref 0.44–1.00)
GFR calc Af Amer: >60 mL/min (ref >60)
GFR calc non Af Amer: >60 mL/min (ref >60)
Glucose, Bld: 92 mg/dL (ref 70–99)
Potassium: 3.3 mmol/L — ABNORMAL LOW (ref 3.5–5.1)
Sodium: 135 mmol/L (ref 135–145)
Total Bilirubin: 0.5 mg/dL (ref 0.3–1.2)
Total Protein: 5.5 g/dL — ABNORMAL LOW (ref 6.5–8.1)

## 2020-05-13 MED ORDER — VERAPAMIL HCL ER 120 MG PO TBCR
120.0000 mg | EXTENDED_RELEASE_TABLET | Freq: Every day | ORAL | Status: DC
  Administered 2020-05-13 – 2020-05-14 (×2): 120 mg via ORAL
  Filled 2020-05-13 (×2): qty 1

## 2020-05-13 MED ORDER — TRAMADOL HCL 50 MG PO TABS
50.0000 mg | ORAL_TABLET | Freq: Four times a day (QID) | ORAL | Status: DC | PRN
  Administered 2020-05-13 – 2020-05-14 (×2): 100 mg via ORAL
  Filled 2020-05-13 (×2): qty 2

## 2020-05-13 NOTE — Plan of Care (Signed)
  Problem: Activity: Goal: Risk for activity intolerance will decrease Outcome: Progressing   Problem: Pain Managment: Goal: General experience of comfort will improve Outcome: Progressing   

## 2020-05-13 NOTE — Progress Notes (Signed)
Physical Therapy Treatment Patient Details Name: Debbie Allen MRN: [id number] DOB: 1965-09-30 Today's Date: 05/13/2020    History of Present Illness Pt is a 54 y.o. female with recent dx of COVID-19 on 04/22/20, syncope and fall on 04/27/20 with L ankle fx s/p reduction and d/c home. Pt now admitted 04/30/20 with hypotension and respiratory failure due to COVID. S/p L ankle ORIF 9/4. PMH includes HLD, PSVT.   PT Comments    Pt progressing well with mobility, although limited by c/o feeling "woozy" and noted to be hypotensive (see values below). Despite this, pt moving well with RW and intermittent min guard for balance. Great ability to maintain LLE NWB precautions. Reviewed precautions, positioning, therex, DVT prevention and importance of mobility. Continue to recommend HHPT services to maximize functional mobility and independence.  Orthostatic BPs Sitting/rest 92/65  Standing 75/48  Standing 2 min 88/49  Sitting rest 98/68  Post-ambulation 90/54      Follow Up Recommendations  Home health PT;Supervision for mobility/OOB     Equipment Recommendations  Rolling walker with 5" wheels    Recommendations for Other Services       Precautions / Restrictions Precautions Precautions: Fall Restrictions Weight Bearing Restrictions: Yes LLE Weight Bearing: Non weight bearing    Mobility  Bed Mobility Overal bed mobility: Needs Assistance Bed Mobility: Supine to Sit     Supine to sit: Supervision;HOB elevated     General bed mobility comments: received sitting in recliner  Transfers Overall transfer level: Needs assistance Equipment used: Rolling walker (2 wheeled) Transfers: Sit to/from Stand Sit to Stand: Supervision Stand pivot transfers: Min guard       General transfer comment: Performed multiple sit<>stands from recliner to RW, supervision for safety; good technique and ability to maintain LLE NWB precautions  Ambulation/Gait Ambulation/Gait assistance: Min  guard Gait Distance (Feet): 34 Feet Assistive device: Rolling walker (2 wheeled)   Gait velocity: Decreased   General Gait Details: Able to walk throughout room with RW, good ability to perform hop-to gait pattern with RW while maintaining LLE NWB precautions; min guard for balance/safety; further distance limited by "wooziness" - pt noted to be hypotensive this session   Stairs Stairs:  (Discussed stairs - pt's husband building ramp and plans to push pt up in w/c)           Wheelchair Mobility    Modified Rankin (Stroke Patients Only)       Balance Overall balance assessment: Needs assistance Sitting-balance support: Feet supported Sitting balance-Leahy Scale: Good     Standing balance support: Bilateral upper extremity supported;Single extremity supported;During functional activity Standing balance-Leahy Scale: Poor Standing balance comment: reliant on at least single UE support given NWB status                             Cognition Arousal/Alertness: Awake/alert Behavior During Therapy: WFL for tasks assessed/performed Overall Cognitive Status: Within Functional Limits for tasks assessed                                        Exercises General Exercises - Lower Extremity Long Arc Quad: AROM;Both;Seated Hip ABduction/ADduction: AROM;Left;Seated Straight Leg Raises: AROM;Left;Seated Hip Flexion/Marching: AROM;Left;Seated    General Comments        Pertinent Vitals/Pain Pain Assessment: Faces Faces Pain Scale: Hurts little more Pain Location: left ankle Pain Descriptors /  Indicators: Discomfort;Pounding Pain Intervention(s): Monitored during session;Limited activity within patient's tolerance    Home Living Family/patient expects to be discharged to:: Private residence Living Arrangements: Spouse/significant other Available Help at Discharge: Family;Available 24 hours/day Type of Home: House Home Access: Stairs to  enter Entrance Stairs-Rails: Right;Left Home Layout: Two level;Able to live on main level with bedroom/bathroom Home Equipment: Gilford Rile - 2 wheels;Bedside commode      Prior Function Level of Independence: Independent      Comments: retired Pharmacist, hospital   PT Goals (current goals can now be found in the care plan section) Acute Rehab PT Goals Patient Stated Goal: return home Progress towards PT goals: Progressing toward goals    Frequency    Min 5X/week      PT Plan Equipment recommendations need to be updated    Co-evaluation              AM-PAC PT "6 Clicks" Mobility   Outcome Measure  Help needed turning from your back to your side while in a flat bed without using bedrails?: None Help needed moving from lying on your back to sitting on the side of a flat bed without using bedrails?: None Help needed moving to and from a bed to a chair (including a wheelchair)?: A Little Help needed standing up from a chair using your arms (e.g., wheelchair or bedside chair)?: None Help needed to walk in hospital room?: A Little Help needed climbing 3-5 steps with a railing? : A Little 6 Click Score: 21    End of Session   Activity Tolerance: Patient tolerated treatment well Patient left: in chair;with call bell/phone within reach Nurse Communication: Mobility status PT Visit Diagnosis: Other abnormalities of gait and mobility (R26.89);Muscle weakness (generalized) (M62.81)     Time: 7341-9379 PT Time Calculation (min) (ACUTE ONLY): 25 min  Charges:  $Therapeutic Exercise: 8-22 mins $Therapeutic Activity: 8-22 mins                    Mabeline Caras, PT, DPT Acute Rehabilitation Services  Pager [phone] Office [place] 05/13/2020, 2:13 PM

## 2020-05-13 NOTE — TOC Initial Note (Addendum)
Transition of Care [facility]: Debbie Allen) - Initial/Assessment Note    Patient Details  Name: Debbie Allen MRN: [id number] Date of Birth: 11-Oct-1965  [facility]) CM/SW Contact:    Curlene Labrum, RN Phone Number: 05/13/2020, 12:42 PM  Clinical Narrative:                 Case management spoke with the patient on the phone regarding discharge needs for home relating to patient's admission for syncopal episode, COVID resp failure, and S/P Left ankle fx and ORIF on 9/4.  The patient lives at home with her husband and has 4 steps entering the home - which patient's husband is Chief Strategy Officer and is building a WC ramp.  The patient currently has possible RW and 3:1 and WC at home from a family member.  The patient is checking on the type of rolling walker and may need one for discharge home.  The patient's PCP is Dr. Wende Neighbors in [place], [place].   The patient was given an Eliquis 30-day free card for home to assist with medication costs and explanation given for use.  Also, the patient is currently on room air.  The patient was given choice regarding home health services company and she did not have a preference.  Called [facility] and is waiting for confirmation of services - will need home health PT/OT set up prior to discharge home.  9/6 1330- [facility] called - up unable to provide staffing, left message with [facility], [facility] - spoke with [facility] and accepted patient for PT/OT - noted in discharge instructions.  Expected Discharge Plan: [place] Barriers to Discharge: Continued Medical Work up   Patient Goals and CMS Choice Patient states their goals for this hospitalization and ongoing recovery are:: Plans to discharge with home health services and home with husband. CMS Medicare.gov Compare Post Acute Care list provided to:: Patient Choice offered to / list presented to : Patient  Expected Discharge Plan and Services Expected Discharge Plan: [place]   Discharge Planning Services: CM Consult Post Acute Care Choice: Home Health, Durable Medical Equipment Living arrangements for the past 2 months: Single Family Home                           HH Arranged: PT, OT [place] Agency:  (find [facility] services for patient.) Date HH Agency Contacted: 05/13/20 Time [place]: 1062 Representative spoke with at [place]: message with [facility] for ?[facility] services with HH OT/PT  Prior Living Arrangements/Services Living arrangements for the past 2 months: Single Family Home Lives with:: Spouse Patient language and need for interpreter reviewed:: Yes Do you feel safe going back to the place where you live?: Yes      Need for Family Participation in Patient Care: Yes (Comment) Care giver support system in place?: Yes (comment) Current home services: DME (patient has used RW - questionable type, 3:1, and WC at home - patient checking with husband for possible need for Rw from unit supply.) Criminal Activity/Legal Involvement Pertinent to Current Situation/Hospitalization: No - Comment as needed  Activities of Daily Living Home Assistive Devices/Equipment: None ADL Screening (condition at time of admission) Patient's cognitive ability adequate to safely complete daily activities?: Yes Is the patient deaf or have difficulty hearing?: No Does the patient have difficulty seeing, even when wearing glasses/contacts?: No Does the patient have difficulty concentrating, remembering, or  making decisions?: No Patient able to express need for assistance with ADLs?: Yes Does the patient have difficulty dressing or bathing?: Yes Independently performs ADLs?: No Does the patient have difficulty walking or climbing stairs?: Yes Weakness of Legs: Left Weakness of Arms/Hands: None  Permission Sought/Granted Permission sought to share information with : Case Manager Permission granted to share information with : Yes, Verbal Permission  Granted     Permission granted to share info Allen AGENCY: home health agency  Permission granted to share info Allen Relationship: patient's husband     Emotional Assessment Appearance:: Appears stated age Attitude/Demeanor/Rapport: Engaged Affect (typically observed): Accepting Orientation: : Oriented to Self, Oriented to Place, Oriented to  Time, Oriented to Situation Alcohol / Substance Use: Not Applicable Psych Involvement: No (comment)  Admission diagnosis:  Acute hypoxemic respiratory failure ([place]) [J96.01] Acute hypoxemic respiratory failure due to COVID-19 ([place]) [U07.1, J96.01] COVID-19 [U07.1] Patient Active Problem List   Diagnosis Date Noted  . Acute hypoxemic respiratory failure due to COVID-19 ([place]) 04/30/2020  . Acid reflux   . Closed left ankle fracture   . 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 Chest pain 06/15/2014  . Aortic regurgitation 04/16/2014  . Palpitations 10/19/2013  . PSVT (paroxysmal supraventricular tachycardia) ([place]) 10/19/2013  . Dyslipidemia 10/19/2013   PCP:  Patient, No Pcp Per Pharmacy:   [place], [address] [place] [address] Phone: [phone] Fax: [phone]     Social Determinants of Health ([place]) Interventions    Readmission Risk Interventions Readmission Risk Prevention Plan 05/13/2020  Transportation Screening Complete  PCP or Specialist Appt within 5-7 Days Complete  Home Care Screening Complete  Medication Review (RN CM) Complete  Some recent data might be hidden

## 2020-05-13 NOTE — Progress Notes (Signed)
PROGRESS NOTE                                                                                                                                                                                                             Patient Demographics:    Debbie Allen, is a 54 y.o. female, DOB - [birth year], UYQ:[id number]  Outpatient Primary MD for the patient is Patient, No Pcp Per    LOS - 13  Admit date - 04/30/2020    Chief Complaint  Patient presents with  . Shortness of Breath       Brief Narrative - Debbie Allen a 54 y.o.femalewith medical history significant ofparoxysmal supraventricular tachycardia, acid reflux, hyperlipidemia presents to emergency department with worsening cough and shortness of breath.  She came to the hospital after she incurred a syncopal episode and injured her left ankle, and the hospital she was diagnosed with left ankle fracture, of note she was recently diagnosed with COVID-19 infection prior to her hospitalization on 04/22/2020 at PCP office.  She was also developing some shortness of breath along with generalized weakness, this most likely caused her syncopal episode.  He was admitted for acute hypoxic respiratory failure due to COVID-19 pneumonia along with syncope causing fall and left ankle fracture.    Subjective:   Patient in bed, appears comfortable, denies any headache, no fever, no chest pain or pressure, no shortness of breath , no abdominal pain. No focal weakness.   Assessment  & Plan :     1. Acute Hypoxic Resp. Failure due to Acute Covid 19 Viral Pneumonitis during the ongoing 2020 Covid 19 Pandemic - she moderate disease and was initially on 15 L nasal cannula oxygen, she has been treated with combination of IV steroids, remdesivir and Baricitinib, she has shown considerable improvement and now at rest she is symptom-free and on room air. Will continue monitoring inflammatory  markers.  Encouraged the patient to sit up in chair in the daytime use I-S and flutter valve for pulmonary toiletry and then prone in bed when at night.  Will advance activity and titrate down oxygen as possible.    Recent Labs  Lab 05/07/20 0434 05/07/20 0434 05/08/20 1029 05/09/20 1335 05/09/20 1336 05/09/20 1814 05/10/20 1530 05/11/20 0132 05/11/20 0138 05/12/20 0458 05/13/20 0711  WBC 9.9   < >  --  10.7*  --   --  8.0  --  7.3 11.5* 9.7  PLT 394   < >  --  593*  --   --  609*  --  529* 555* 520*  CRP 13.1*  --   --   --   --  3.3* 2.4*  --  1.3*  --   --   BNP  --   --   --   --  76.7  --  71.3 56.8  --   --   --   DDIMER 0.83*  --  1.00*  --   --  0.92* 1.09*  --  0.81*  --   --   AST 59*   < >  --  175*  --   --  97*  --  63* 35 27  ALT 70*   < >  --  279*  --   --  229*  --  187* 129* 93*  ALKPHOS 101   < >  --  189*  --   --  178*  --  155* 136* 125  BILITOT 0.5   < >  --  0.7  --   --  0.6  --  0.6 0.7 0.5  ALBUMIN 2.5*   < >  --  3.0*  --   --  3.1*  --  2.8* 2.7* 2.7*  INR  --   --  1.0  --   --   --   --   --   --   --   --    < > = values in this interval not displayed.    2.  Decreased oral intake, generalized weakness, hypotension and syncope.  All due to COVID-19 infection, blood pressure is improved after hydration.  Stable echocardiogram which appears nonacute.  3.  Fall with left ankle fracture.  Reduced and splinted in the ER on 04/27/2020, she has lateral and medial malleolus fractures.  Orthopedic on board.  She underwent open reduction internal fixation of her fracture on 05/11/2020 by Dr. Victorino December, no weightbearing in left leg for at least 6 weeks then as per orthopedics, Lovenox for now for DVT prophylaxis.  Commence PT OT, likely HHPT in am  4.  Mild transaminitis.  Asymptomatic due to COVID-19 viral infection.  Monitor trend.  5.  PSVT.  Due to dehydration, currently on low-dose metoprolol continue.  Has been adequately hydrated.  6.  Mild  intermittent asthma.  Stable continue supportive care.  No wheezing.  7.  Dyslipidemia.  On statin.    Condition - Fair  Family Communication  :  Husband Merry Proud [phone] on 05/08/2020, 05/10/20 -  message left at 11:35 AM, on 05/12/2020  Code Status :  Full  Consults  :  Ortho  Procedures  :    Open treatment of left ankle fracture with internal fixation. Bimalleolar with application of short leg splint left ankle. SURGEON: Jason P. Stann Mainland, M.D. on 05/11/20    TTE - 1. Left ventricular ejection fraction, by estimation, is 55 to 60%. The left ventricle has normal function. The left ventricle has no regional wall motion abnormalities. Left ventricular diastolic parameters are consistent with Grade II diastolic dysfunction (pseudonormalization).  2. Right ventricular systolic function is normal. The right ventricular size is normal. There is normal pulmonary artery systolic pressure. The estimated right ventricular systolic pressure is 56.4 mmHg.  3. The mitral valve is normal in structure. Trivial mitral valve regurgitation. No evidence  of mitral stenosis.  4. The aortic valve is tricuspid. Aortic valve regurgitation is mild. No aortic stenosis is present.  5. The inferior vena cava is normal in size with greater than 50% respiratory variability, suggesting right atrial pressure of 3 mmHg.  PUD Prophylaxis : PPI  Disposition Plan  :    Status is: Inpatient  Remains inpatient appropriate because:IV treatments appropriate due to intensity of illness or inability to take PO   Dispo: The patient is from: Home              Anticipated d/c is to: Home              Anticipated d/c date is: > 3 days              Patient currently is not medically stable to d/c.  DVT Prophylaxis  :  Lovenox   Lab Results  Component Value Date   PLT 520 (H) 05/13/2020    Diet :  Diet Order            Diet regular Room service appropriate? Yes; Fluid consistency: Thin  Diet effective now                   Inpatient Medications  Scheduled Meds: . AeroChamber Plus Flo-Vu Medium  1 each Other Once  . aspirin EC  81 mg Oral Daily  . docusate sodium  100 mg Oral BID  . enoxaparin (LOVENOX) injection  40 mg Subcutaneous Q24H  . feeding supplement (ENSURE ENLIVE)  237 mL Oral BID BM  . mometasone-formoterol  2 puff Inhalation BID  . montelukast  10 mg Oral Daily  . morphine  15 mg Oral Q12H  . pantoprazole  40 mg Oral Daily  . rosuvastatin  20 mg Oral Daily  . senna  2 tablet Oral Daily  . verapamil  120 mg Oral Daily   Continuous Infusions: PRN Meds:.acetaminophen, albuterol, alum & mag hydroxide-simeth, bisacodyl, guaiFENesin-dextromethorphan, menthol-cetylpyridinium, [DISCONTINUED] ondansetron **OR** ondansetron (ZOFRAN) IV, polyethylene glycol, sodium chloride, sodium phosphate  Antibiotics  :    Anti-infectives (From admission, onward)   Start     Dose/Rate Route Frequency Ordered Stop   05/01/20 1000  remdesivir 100 mg in sodium chloride 0.9 % 100 mL IVPB       "Followed by" Linked Group Details   100 mg 200 mL/hr over 30 Minutes Intravenous Daily 04/30/20 1444 05/04/20 0944   04/30/20 1600  remdesivir 200 mg in sodium chloride 0.9% 250 mL IVPB       "Followed by" Linked Group Details   200 mg 580 mL/hr over 30 Minutes Intravenous Once 04/30/20 1444 04/30/20 2110       Time Spent in minutes  30   Lala Lund M.D on 05/13/2020 at 11:06 AM  To page go to [email or web address] - password [facility]  [facility] -  Office  [phone]    See all Orders from today for further details    Objective:   Vitals:   05/13/20 0300 05/13/20 0400 05/13/20 0444 05/13/20 1008  BP:   121/72 105/62  Pulse: (!) 58 (!) 55 61 80  Resp: 15 14 14 20   Temp:   98.1 F (36.7 C) 98 F (36.7 C)  TempSrc:   Oral   SpO2: 93% 93% 96% 97%  Weight:      Height:        Wt Readings from Last 3 Encounters:  05/01/20 80.7 kg  04/27/20 80.7 kg  12/06/19 82.6  kg     Intake/Output  Summary (Last 24 hours) at 05/13/2020 1106 Last data filed at 05/12/2020 2100 Gross per 24 hour  Intake --  Output 2 ml  Net -2 ml     Physical Exam  Awake Alert, No new F.N deficits, Normal affect [place].AT,PERRAL Supple Neck,No JVD, No cervical lymphadenopathy appriciated.  Symmetrical Chest wall movement, Good air movement bilaterally, CTAB RRR,No Gallops, Rubs or new Murmurs, No Parasternal Heave +ve B.Sounds, Abd Soft, No tenderness, No organomegaly appriciated, No rebound - guarding or rigidity. No Cyanosis,  left ankle under bandage    Data Review:    CBC Recent Labs  Lab 05/09/20 1335 05/10/20 1530 05/11/20 0138 05/12/20 0458 05/13/20 0711  WBC 10.7* 8.0 7.3 11.5* 9.7  HGB 11.6* 11.5* 10.6* 10.9* 10.6*  HCT 36.4 36.2 32.6* 34.2* 33.8*  PLT 593* 609* 529* 555* 520*  MCV 86.7 87.7 85.6 86.4 88.0  MCH 27.6 27.8 27.8 27.5 27.6  MCHC 31.9 31.8 32.5 31.9 31.4  RDW 13.4 13.3 13.2 13.2 13.6  LYMPHSABS  --  0.5* 0.7 1.1 1.9  MONOABS  --  0.2 0.7 1.1* 0.9  EOSABS  --  0.0 0.0 0.0 0.0  BASOSABS  --  0.0 0.0 0.0 0.0    Recent Labs  Lab 05/07/20 0434 05/07/20 0434 05/08/20 1029 05/09/20 1335 05/09/20 1336 05/09/20 1814 05/10/20 1530 05/11/20 0132 05/11/20 0138 05/12/20 0458 05/13/20 0711  NA 134*   < >  --  141  --   --  137  --  137 139 135  K 3.6   < >  --  3.7  --   --  3.8  --  4.2 3.9 3.3*  CL 98   < >  --  105  --   --  100  --  101 102 99  CO2 25   < >  --  22  --   --  24  --  27 29 25   GLUCOSE 114*   < >  --  115*  --   --  118*  --  116* 101* 92  BUN 11   < >  --  19  --   --  22*  --  17 17 17   CREATININE 0.67   < >  --  0.78  --   --  1.05*  --  0.83 0.98 0.99  CALCIUM 8.2*   < >  --  9.0  --   --  9.0  --  8.7* 8.7* 8.2*  AST 59*   < >  --  175*  --   --  97*  --  63* 35 27  ALT 70*   < >  --  279*  --   --  229*  --  187* 129* 93*  ALKPHOS 101   < >  --  189*  --   --  178*  --  155* 136* 125  BILITOT 0.5   < >  --  0.7  --   --  0.6  --  0.6 0.7  0.5  ALBUMIN 2.5*   < >  --  3.0*  --   --  3.1*  --  2.8* 2.7* 2.7*  MG  --   --   --  2.3  --   --  2.2  --  2.3  --   --   CRP 13.1*  --   --   --   --  3.3* 2.4*  --  1.3*  --   --   DDIMER 0.83*  --  1.00*  --   --  0.92* 1.09*  --  0.81*  --   --   INR  --   --  1.0  --   --   --   --   --   --   --   --   BNP  --   --   --   --  76.7  --  71.3 56.8  --   --   --    < > = values in this interval not displayed.    ------------------------------------------------------------------------------------------------------------------ No results for input(s): CHOL, HDL, LDLCALC, TRIG, CHOLHDL, LDLDIRECT in the last 72 hours.  No results found for: HGBA1C ------------------------------------------------------------------------------------------------------------------ No results for input(s): TSH, T4TOTAL, T3FREE, THYROIDAB in the last 72 hours.  Invalid input(s): FREET3  Cardiac Enzymes No results for input(s): CKMB, TROPONINI, MYOGLOBIN in the last 168 hours.  Invalid input(s): CK ------------------------------------------------------------------------------------------------------------------    Component Value Date/Time   BNP 56.8 05/11/2020 0132    Micro Results No results found for this or any previous visit (from the past 240 hour(s)).  Radiology Reports DG Ankle Complete Left  Result Date: 04/27/2020 CLINICAL DATA:  The patient suffered left medial and lateral malleolar fractures in a fall today. Post reduction imaging. Initial encounter. EXAM: LEFT ANKLE COMPLETE - 3+ VIEW COMPARISON:  Plain films of the left ankle earlier today. FINDINGS: The patient is now in a fiberglass cast. Position and alignment of the patient's medial and lateral malleolar fractures are markedly improved. No new abnormality. IMPRESSION: Marked improvement in position and alignment of medial and lateral malleolar fractures. No new abnormality. Electronically Signed   By: Inge Rise M.D.   On:  04/27/2020 15:24   DG Ankle Complete Left  Result Date: 04/27/2020 CLINICAL DATA:  The patient suffered a left ankle injury in a fall today. Initial encounter. EXAM: LEFT ANKLE COMPLETE - 3+ VIEW COMPARISON:  None. FINDINGS: The patient has acute medial and lateral malleolar fractures. Both fractures are laterally displaced approximately 1 shaft width. The talus is laterally subluxed 1.1 cm. No other acute bony or joint abnormality is identified. There is soft tissue swelling about the patient's fractures. Pins for hallux valgus repair in the distal first metatarsal noted. IMPRESSION: Acute, displaced medial and lateral malleolar fractures as described. Electronically Signed   By: Inge Rise M.D.   On: 04/27/2020 14:11   DG Chest Port 1 View  Result Date: 05/08/2020 CLINICAL DATA:  Shortness of breath.  Recent COVID diagnosis EXAM: PORTABLE CHEST 1 VIEW COMPARISON:  04/30/2020 FINDINGS: Stable cardiomediastinal contours. No pleural effusion or edema. Interval increase in peripheral interstitial and airspace densities within the right upper lobe and right lower lobe as well as the left midlung and left base. IMPRESSION: Interval increase in bilateral pulmonary opacities compatible with progression of multifocal infection. Electronically Signed   By: Kerby Moors M.D.   On: 05/08/2020 11:44   DG Chest Port 1 View  Result Date: 04/30/2020 CLINICAL DATA:  Cough and shortness of breath. Recent COVID diagnosis. EXAM: PORTABLE CHEST 1 VIEW COMPARISON:  Chest x-ray dated May 15, 2016. FINDINGS: The heart size and mediastinal contours are within normal limits. Streaky linear and reticulonodular interstitial thickening and opacity at both lung bases. No focal consolidation, pleural effusion, or pneumothorax. No acute osseous abnormality. IMPRESSION: 1. Bilateral lower lobe interstitial and airspace disease, consistent with atypical infection/viral pneumonia. Electronically  Signed   By: Titus Dubin  M.D.   On: 04/30/2020 12:23   DG MINI C-ARM IMAGE ONLY  Result Date: 05/11/2020 There is no interpretation for this exam.  This order is for images obtained during a surgical procedure.  Please See "Surgeries" Tab for more information regarding the procedure.   ECHOCARDIOGRAM LIMITED  Result Date: 05/07/2020    ECHOCARDIOGRAM LIMITED REPORT   Patient Name:   Jazmina S Manigo Date of Exam: 05/07/2020 Medical Rec #:  [id number]   Height:       62.0 in Accession #:    [id number]  Weight:       178.0 lb Date of Birth:  1966/02/27  BSA:          1.819 m Patient Age:    89 years    BP:           98/73 mmHg Patient Gender: F           HR:           69 bpm. Exam Location:  Inpatient Procedure: Limited Echo, Limited Color Doppler and Cardiac Doppler Indications:    Hypotension  History:        Patient has prior history of Echocardiogram examinations, most                 recent 07/05/2015. Covid-19 Positive; Aortic Valve Disease.  Sonographer:    Mikki Santee RDCS (AE) Referring Phys: [id number] [address] [place] IMPRESSIONS  1. Left ventricular ejection fraction, by estimation, is 55 to 60%. The left ventricle has normal function. The left ventricle has no regional wall motion abnormalities. Left ventricular diastolic parameters are consistent with Grade II diastolic dysfunction (pseudonormalization).  2. Right ventricular systolic function is normal. The right ventricular size is normal. There is normal pulmonary artery systolic pressure. The estimated right ventricular systolic pressure is 73.4 mmHg.  3. The mitral valve is normal in structure. Trivial mitral valve regurgitation. No evidence of mitral stenosis.  4. The aortic valve is tricuspid. Aortic valve regurgitation is mild. No aortic stenosis is present.  5. The inferior vena cava is normal in size with greater than 50% respiratory variability, suggesting right atrial pressure of 3 mmHg. FINDINGS  Left Ventricle: Left ventricular ejection fraction, by estimation,  is 55 to 60%. The left ventricle has normal function. The left ventricle has no regional wall motion abnormalities. The left ventricular internal cavity size was normal in size. There is  no left ventricular hypertrophy. Right Ventricle: The right ventricular size is normal. No increase in right ventricular wall thickness. Right ventricular systolic function is normal. There is normal pulmonary artery systolic pressure. The tricuspid regurgitant velocity is 2.19 m/s, and  with an assumed right atrial pressure of 3 mmHg, the estimated right ventricular systolic pressure is 28.7 mmHg. Left Atrium: Left atrial size was normal in size. Right Atrium: Right atrial size was normal in size. Mitral Valve: The mitral valve is normal in structure. Trivial mitral valve regurgitation. No evidence of mitral valve stenosis. Tricuspid Valve: The tricuspid valve is normal in structure. Tricuspid valve regurgitation is trivial. Aortic Valve: The aortic valve is tricuspid. Aortic valve regurgitation is mild. Aortic regurgitation PHT measures 750 msec. No aortic stenosis is present. Aorta: The aortic root is normal in size and structure. Venous: The inferior vena cava is normal in size with greater than 50% respiratory variability, suggesting right atrial pressure of 3 mmHg. IAS/Shunts: No atrial level shunt detected by color flow Doppler. LEFT  VENTRICLE PLAX 2D LVIDd:         4.43 cm  Diastology LVIDs:         3.30 cm  LV e' lateral:   7.29 cm/s LV PW:         0.90 cm  LV E/e' lateral: 11.6 LV IVS:        0.86 cm  LV e' medial:    6.64 cm/s LVOT diam:     1.80 cm  LV E/e' medial:  12.8 LVOT Area:     2.54 cm  LEFT ATRIUM             Index       RIGHT ATRIUM           Index LA diam:        3.40 cm 1.87 cm/m  RA Area:     14.20 cm LA Vol (A2C):   46.3 ml 25.45 ml/m RA Volume:   32.50 ml  17.86 ml/m LA Vol (A4C):   39.1 ml 21.49 ml/m LA Biplane Vol: 47.0 ml 25.83 ml/m  AORTIC VALVE AI PHT:      750 msec  AORTA Ao Root diam: 3.00  cm MITRAL VALVE               TRICUSPID VALVE MV Area (PHT): 2.91 cm    TR Peak grad:   19.2 mmHg MV Decel Time: 261 msec    TR Vmax:        219.00 cm/s MV E velocity: 84.80 cm/s MV A velocity: 80.60 cm/s  SHUNTS MV E/A ratio:  1.05        Systemic Diam: 1.80 cm Loralie Champagne MD Electronically signed by Loralie Champagne MD Signature Date/Time: 05/07/2020/4:46:48 PM    Final

## 2020-05-13 NOTE — Evaluation (Addendum)
Occupational Therapy Evaluation Patient Details Name: Debbie Allen MRN: [id number] DOB: 1966-06-08 Today's Date: 05/13/2020    History of Present Illness 54 yo female diagnosed with Covid 19 8/16 at PCP office with Syncope and visit to ED on 8/21 with left ankle fx s/p reduction and D/C. Pt returned 8/24 and admitted with hypotension, respiratory failure due to Covid. Pt s/p ORIF left ankle 9/4. PMhx: HLD, PSVT   Clinical Impression   This 54 y/o female presents with the above. PTA pt reports being independent with ADL and functional mobility. Pt very pleasant and willing to participate in therapy session however reporting nausea and feeling light headed start of session (RN providing nausea meds prior to session start which pt reports improvements with). Pt tolerating functional transfers and mobility a short distance in room (approx 4') to recliner using RW at [facility] assist level. She currently requires up to [facility] for LB ADL given LLE limitations. Initiated discussion of safety and compensatory techniques for completing ADL and mobility tasks after return home. Pt to benefit from continued acute OT services and currently recommend followup [place] services after discharge to maximize her overall safety and independence with ADL and mobility.   BP start of session 105/62 Seated EOB: 123/64 End of session (post toileting and mobility to recliner): 125/82 SpO2 >92% on RA     Follow Up Recommendations  Home health OT;Supervision/Assistance - 24 hour    Equipment Recommendations  None recommended by OT (pt to make sure she has 3:1 at home)           Precautions / Restrictions Precautions Precautions: Fall Restrictions Weight Bearing Restrictions: Yes LLE Weight Bearing: Non weight bearing      Mobility Bed Mobility Overal bed mobility: Needs Assistance Bed Mobility: Supine to Sit     Supine to sit: Supervision;HOB elevated        Transfers Overall transfer level: Needs  assistance Equipment used: Rolling walker (2 wheeled) Transfers: Sit to/from [facility] Sit to Stand: Min guard Stand pivot transfers: Min guard       General transfer comment: for safety and balance, VCs for safe hand placement initially, stand pivot to [facility] and then able to take few hops to recliner in room     Balance Overall balance assessment: Needs assistance Sitting-balance support: Feet supported Sitting balance-Leahy Scale: Good     Standing balance support: Bilateral upper extremity supported;Single extremity supported;During functional activity Standing balance-Leahy Scale: Poor Standing balance comment: reliant on at least single UE support given NWB status                            ADL either performed or assessed with clinical judgement   ADL Overall ADL's : Needs assistance/impaired Eating/Feeding: Modified independent;Sitting   Grooming: Wash/dry hands;Set up;Sitting Grooming Details (indicate cue type and reason): discussed safety techniques for performing in standing - will benefit from practicing  Upper Body Bathing: Sitting;Modified independent   Lower Body Bathing: Sitting/lateral leans;Sit to/from stand;Minimal assistance   Upper Body Dressing : Modified independent;Sitting   Lower Body Dressing: Moderate assistance;Sit to/from stand   Toilet Transfer: Min Press photographer Details (indicate cue type and reason): stand pivot vs mobility to BR given pt with lightheadedness and nausea start of session  Toileting- Clothing Manipulation and Hygiene: Minimal assistance;Sitting/lateral lean;Sit to/from stand Toileting - Clothing Manipulation Details (indicate cue type and reason): for gown management and to support part of RW, pt standing  for perciare      Functional mobility during ADLs: Min guard;Rolling walker                           Pertinent Vitals/Pain Pain Assessment: Faces Faces Pain  Scale: Hurts little more Pain Location: left ankle Pain Descriptors / Indicators: Aching;Guarding;Pressure Pain Intervention(s): Monitored during session;Limited activity within patient's tolerance;Repositioned     Hand Dominance     Extremity/Trunk Assessment Upper Extremity Assessment Upper Extremity Assessment: Overall WFL for tasks assessed   Lower Extremity Assessment Lower Extremity Assessment: Defer to PT evaluation   Cervical / Trunk Assessment Cervical / Trunk Assessment: Normal   Communication Communication Communication: No difficulties   Cognition Arousal/Alertness: Awake/alert Behavior During Therapy: WFL for tasks assessed/performed Overall Cognitive Status: Within Functional Limits for tasks assessed                                     General Comments       Exercises     Shoulder Instructions      Home Living Family/patient expects to be discharged to:: Private residence Living Arrangements: Spouse/significant other Available Help at Discharge: Family;Available 24 hours/day Type of Home: House Home Access: Stairs to enter [employer] of Steps: 4 Entrance Stairs-Rails: Right;Left Home Layout: Two level;Able to live on main level with bedroom/bathroom     Bathroom Shower/Tub: Walk-in shower   Bathroom Toilet: Handicapped height     Home Equipment: Environmental consultant - 2 wheels;Bedside commode          Prior Functioning/Environment Level of Independence: Independent        Comments: retired Actor Problem List: Decreased strength;Decreased range of motion;Decreased activity tolerance;Impaired balance (sitting and/or standing);Decreased safety awareness;Decreased knowledge of use of DME or AE;Decreased knowledge of precautions;Pain      OT Treatment/Interventions: Self-care/ADL training;Therapeutic exercise;Energy conservation;DME and/or AE instruction;Therapeutic activities;Patient/family education;Balance  training    OT Goals(Current goals can be found in the care plan section) Acute Rehab OT Goals Patient Stated Goal: return home OT Goal Formulation: With patient Time For Goal Achievement: 05/27/20 Potential to Achieve Goals: Good  OT Frequency: Min 2X/week   Barriers to D/C:            Co-evaluation              AM-PAC OT "6 Clicks" Daily Activity     Outcome Measure Help from another person eating meals?: None Help from another person taking care of personal grooming?: A Little Help from another person toileting, which includes using toliet, bedpan, or urinal?: A Little Help from another person bathing (including washing, rinsing, drying)?: A Little Help from another person to put on and taking off regular upper body clothing?: None Help from another person to put on and taking off regular lower body clothing?: A Little 6 Click Score: 20   End of Session Equipment Utilized During Treatment: Rolling walker Nurse Communication: Mobility status  Activity Tolerance: Patient tolerated treatment well Patient left: in chair;with call bell/phone within reach  OT Visit Diagnosis: Other abnormalities of gait and mobility (R26.89);Pain Pain - Right/Left: Left Pain - part of body: Leg;Ankle and joints of foot                Time: 1287-8676 OT Time Calculation (min): 35 min Charges:  OT General Charges $OT Visit: 1 Visit  OT Evaluation $OT Eval Moderate Complexity: 1 Mod OT Treatments $Self Care/Home Management : 8-22 mins  Lou Cal, OT Acute Rehabilitation Services Pager [phone] Office [place] 05/13/2020, 11:35 AM

## 2020-05-13 NOTE — Progress Notes (Signed)
     Subjective: 2 Days Post-Op Procedure(s) (LRB): OPEN REDUCTION INTERNAL FIXATION (ORIF) ANKLE FRACTURE (Left)   Patient reports pain as mild, pain has improved from yesterday.  Discussed rest, ice, elevation and NWB status.       Objective:   VITALS:   Vitals:   05/13/20 0400 05/13/20 0444  BP:  121/72  Pulse: (!) 55 61  Resp: 14 14  Temp:  98.1 F (36.7 C)  SpO2: 93% 96%    Splint appears to be good position and alignment.  No drainage Neurovascular intact Dorsiflexion/Plantar flexion intact Compartment soft  LABS Recent Labs    05/11/20 0138 05/12/20 0458 05/13/20 0711  HGB 10.6* 10.9* 10.6*  HCT 32.6* 34.2* 33.8*  WBC 7.3 11.5* 9.7  PLT 529* 555* 520*    Recent Labs    05/11/20 0138 05/12/20 0458 05/13/20 0711  NA 137 139 135  K 4.2 3.9 3.3*  BUN 17 17 17   CREATININE 0.83 0.98 0.99  GLUCOSE 116* 101* 92     Assessment/Plan: 2 Days Post-Op Procedure(s) (LRB): OPEN REDUCTION INTERNAL FIXATION (ORIF) ANKLE FRACTURE (Left)   NWB on the left leg for approximately 6 weeks Elevating the left leg Follow up in the clinic in 2 weeks for suture removal She will be immobilized in a short leg splint and then transitioned to a CAM walker at her first follow up appointment For now, she will return to the hospitalist service for the remainder of her admission. She can resume DVT prophylaxis with Lovenox on her return to the floor.        Danae Orleans PA-C  [facility]  [place] [address] [place], [place], [place] [address] Phone: [phone] [email or web address] Facebook  [employer]

## 2020-05-14 ENCOUNTER — Encounter (HOSPITAL_COMMUNITY): Payer: Self-pay | Admitting: Orthopedic Surgery

## 2020-05-14 LAB — CREATININE, SERUM
Creatinine, Ser: 0.8 mg/dL (ref 0.44–1.00)
GFR calc Af Amer: >60 mL/min (ref >60)
GFR calc non Af Amer: >60 mL/min (ref >60)

## 2020-05-14 LAB — CBC WITH DIFFERENTIAL/PLATELET
Abs Immature Granulocytes: 0.19 10*3/uL — ABNORMAL HIGH (ref 0.00–0.07)
Basophils Absolute: 0 10*3/uL (ref 0.0–0.1)
Basophils Relative: 0 %
Eosinophils Absolute: 0.2 10*3/uL (ref 0.0–0.5)
Eosinophils Relative: 2 %
HCT: 33.7 % — ABNORMAL LOW (ref 36.0–46.0)
Hemoglobin: 10.6 g/dL — ABNORMAL LOW (ref 12.0–15.0)
Immature Granulocytes: 2 %
Lymphocytes Relative: 14 %
Lymphs Abs: 1.2 10*3/uL (ref 0.7–4.0)
MCH: 27.4 pg (ref 26.0–34.0)
MCHC: 31.5 g/dL (ref 30.0–36.0)
MCV: 87.1 fL (ref 80.0–100.0)
Monocytes Absolute: 0.7 10*3/uL (ref 0.1–1.0)
Monocytes Relative: 8 %
Neutro Abs: 6 10*3/uL (ref 1.7–7.7)
Neutrophils Relative %: 74 %
Platelets: 474 10*3/uL — ABNORMAL HIGH (ref 150–400)
RBC: 3.87 MIL/uL (ref 3.87–5.11)
RDW: 13.7 % (ref 11.5–15.5)
WBC: 8.2 10*3/uL (ref 4.0–10.5)
nRBC: 0 % (ref 0.0–0.2)

## 2020-05-14 MED ORDER — ASPIRIN EC 81 MG PO TBEC: 81.0000 mg | DELAYED_RELEASE_TABLET | Freq: Every day | ORAL | 3 refills | Status: AC

## 2020-05-14 MED ORDER — TRAMADOL HCL 50 MG PO TABS
50.0000 mg | ORAL_TABLET | Freq: Four times a day (QID) | ORAL | 0 refills | Status: DC | PRN
Start: 2020-05-14 — End: 2020-11-05

## 2020-05-14 MED ORDER — APIXABAN 2.5 MG PO TABS: 2.5000 mg | ORAL_TABLET | Freq: Two times a day (BID) | ORAL | 0 refills | Status: DC

## 2020-05-14 MED ORDER — POTASSIUM CHLORIDE CRYS ER 20 MEQ PO TBCR
40.0000 meq | EXTENDED_RELEASE_TABLET | Freq: Once | ORAL | Status: AC
  Administered 2020-05-14: 40 meq via ORAL
  Filled 2020-05-14: qty 2

## 2020-05-14 MED ORDER — DOCUSATE SODIUM 100 MG PO CAPS: 200.0000 mg | ORAL_CAPSULE | Freq: Two times a day (BID) | ORAL | 0 refills | Status: AC | PRN

## 2020-05-14 NOTE — Progress Notes (Signed)
Occupational Therapy Treatment Patient Details Name: Debbie Allen MRN: [id number] DOB: May 31, 1966 Today's Date: 05/14/2020    History of present illness Pt is a 54 y.o. female with recent dx of COVID-19 on 04/22/20, syncope and fall on 04/27/20 with L ankle fx s/p reduction and d/c home. Pt now admitted 04/30/20 with hypotension and respiratory failure due to COVID. S/p L ankle ORIF 9/4. PMH includes HLD, PSVT.   OT comments  Pt presents supine in bed agreeable to therapy session. Session limited as pt reports continued "wooziness" from pain meds, but is agreeable to OOB. Pt performing functional transfers with minguard - supervision using RW. Further educated on safety and compensatory techniques for completing ADL and functional transfers given NWB status with pt verbalizing understanding throughout. Pt anticipating d/c home today with spouse assist available at home. Will continue per POC.  BP supine: 114/77 Seated EOB: 110/68 Post transfer to recliner 104/71   Follow Up Recommendations  Home health OT;Supervision/Assistance - 24 hour    Equipment Recommendations  None recommended by OT          Precautions / Restrictions Precautions Precautions: Fall;Other (comment) Precaution Comments: Soft BP Restrictions Weight Bearing Restrictions: Yes LLE Weight Bearing: Non weight bearing       Mobility Bed Mobility Overal bed mobility: Modified Independent             General bed mobility comments: HOB elevated - pt reports plans to sleep  in recliner initially   Transfers Overall transfer level: Needs assistance Equipment used: Rolling walker (2 wheeled) Transfers: Sit to/from Stand Sit to Stand: Supervision              Balance Overall balance assessment: Needs assistance Sitting-balance support: Feet supported Sitting balance-Leahy Scale: Good     Standing balance support: Bilateral upper extremity supported;Single extremity supported;During functional  activity Standing balance-Leahy Scale: Poor Standing balance comment: reliant on at least single UE support given NWB status                            ADL either performed or assessed with clinical judgement   ADL Overall ADL's : Needs assistance/impaired     Grooming: Set up;Sitting Grooming Details (indicate cue type and reason): performing in sitting given "wooziness" from medications                            Tub/Shower Transfer Details (indicate cue type and reason): discussed safe transfer techniques to walk-in shower and using 3:1 as shower seat Functional mobility during ADLs: Min guard;Rolling walker General ADL Comments: discussed safety with RW and compensatory strategies for performing ADL tasks including LB ADL and standing tasks                        Cognition Arousal/Alertness: Awake/alert Behavior During Therapy: WFL for tasks assessed/performed Overall Cognitive Status: Within Functional Limits for tasks assessed                                          Exercises     Shoulder Instructions       General Comments      Pertinent Vitals/ Pain       Pain Assessment: Faces Faces Pain Scale: Hurts little more Pain Location: left ankle Pain  Descriptors / Indicators: Discomfort Pain Intervention(s): Monitored during session  Home Living                                          Prior Functioning/Environment              Frequency  Min 2X/week        Progress Toward Goals  OT Goals(current goals can now be found in the care plan section)  Progress towards OT goals: Progressing toward goals  Acute Rehab OT Goals Patient Stated Goal: return home OT Goal Formulation: With patient Time For Goal Achievement: 05/27/20 Potential to Achieve Goals: Good ADL Goals Pt Will Perform Grooming: standing;with modified independence Pt Will Perform Lower Body Bathing: sitting/lateral leans;sit  to/from stand;with modified independence Pt Will Perform Lower Body Dressing: with modified independence;sit to/from stand;sitting/lateral leans Pt Will Transfer to Toilet: with modified independence;ambulating;bedside commode Pt Will Perform Toileting - Clothing Manipulation and hygiene: with modified independence;sit to/from stand;sitting/lateral leans Pt Will Perform Tub/Shower Transfer: Shower transfer;with modified independence;3 in 1;rolling walker;ambulating  Plan Discharge plan remains appropriate    Co-evaluation                 AM-PAC OT "6 Clicks" Daily Activity     Outcome Measure   Help from another person eating meals?: None Help from another person taking care of personal grooming?: A Little Help from another person toileting, which includes using toliet, bedpan, or urinal?: A Little Help from another person bathing (including washing, rinsing, drying)?: A Little Help from another person to put on and taking off regular upper body clothing?: None Help from another person to put on and taking off regular lower body clothing?: A Little 6 Click Score: 20    End of Session Equipment Utilized During Treatment: Rolling walker  OT Visit Diagnosis: Other abnormalities of gait and mobility (R26.89);Pain Pain - Right/Left: Left Pain - part of body: Leg;Ankle and joints of foot   Activity Tolerance Patient tolerated treatment well   Patient Left in chair;with call bell/phone within reach   Nurse Communication Mobility status        Time: 4709-6283 OT Time Calculation (min): 28 min  Charges: OT General Charges $OT Visit: 1 Visit OT Treatments $Self Care/Home Management : 23-37 mins  Lou Cal, [place] Pager [phone] Office [phone]    Raymondo Band 05/14/2020, 4:33 PM

## 2020-05-14 NOTE — TOC Progression Note (Signed)
Transition of Care [facility]) - Progression Note    Patient Details  Name: Debbie Allen MRN: [id number] Date of Birth: 23-Sep-1965  Transition of Care [facility]) CM/SW Contact  Curlene Labrum, RN Phone Number: 05/14/2020, 11:22 AM  Clinical Narrative:    Case Management spoke with [facility] this morning and they are unable to provide home health services for the patient since they are out of network with the patient's insurance provider.  I called and spoke with [place], [facility] at [facility] and they are checking with the central office to confirm acceptance of the patient for home health services.  Patient was give an Eliquis card yesterday for medication assistance.  Also, the patient will be provided with a RW for home if needed.   Expected Discharge Plan: [place] Barriers to Discharge: Continued Medical Work up  Expected Discharge Plan and Services Expected Discharge Plan: [place]   Discharge Planning Services: CM Consult Post Acute Care Choice: Home Health, Durable Medical Equipment Living arrangements for the past 2 months: Single Family Home Expected Discharge Date: 05/14/20                         HH Arranged: PT, OT HH Agency:  (find [place] services for patient.) Date [place]: 05/13/20 Time [place]: 57 Representative spoke with at [place]: message with [place] for ?[place] services with [place] OT/PT   Social Determinants of Health (SDOH) Interventions    Readmission Risk Interventions Readmission Risk Prevention Plan 05/13/2020  Transportation Screening Complete  PCP or Specialist Appt within 5-7 Days Complete  Home Care Screening Complete  Medication Review (RN CM) Complete  Some recent data might be hidden

## 2020-05-14 NOTE — Discharge Instructions (Signed)
Orthopedic discharge instructions:  -Maintain postoperative splint on the left ankle at all times.  You should elevate with your "toes above nose as able.  -No weightbearing to the left lower extremity.  -Your splint should remain clean and dry at all times.  Please do not get wet. -For mild to moderate pain use Tylenol and Advil around-the-clock.  For breakthrough pain use oxycodone as necessary.  -For the prevention of blood clots I would recommend utilizing Lovenox for 2 weeks postoperatively.  We will then transition you to an 81 mg aspirin once per day x4 weeks.  -Return to see Dr. Stann Mainland in 2 weeks postoperatively for routine postop care and check.  Follow with Primary MD Patient, No Pcp Per in 7 days   Get CBC, CMP, 2 view Chest X ray -  checked next visit within 1 week by Primary MD   Activity: No weightbearing on the left leg, as tolerated with Full fall precautions use walker/cane & assistance as needed  Disposition Home   Diet: Heart Healthy    Special Instructions: If you have smoked or chewed Tobacco  in the last 2 yrs please stop smoking, stop any regular Alcohol  and or any Recreational drug use.  On your next visit with your primary care physician please Get Medicines reviewed and adjusted.  Please request your Prim.MD to go over all Hospital Tests and Procedure/Radiological results at the follow up, please get all Hospital records sent to your Prim MD by signing hospital release before you go home.  If you experience worsening of your admission symptoms, develop shortness of breath, life threatening emergency, suicidal or homicidal thoughts you must seek medical attention immediately by calling 911 or calling your MD immediately  if symptoms less severe.  You Must read complete instructions/literature along with all the possible adverse reactions/side effects for all the Medicines you take and that have been prescribed to you. Take any new Medicines after you have  completely understood and accpet all the possible adverse reactions/side effects.

## 2020-05-14 NOTE — Progress Notes (Signed)
Physical Therapy Treatment Patient Details Name: Debbie Allen MRN: [id number] DOB: 24-Mar-1966 Today's Date: 05/14/2020    History of Present Illness Pt is a 54 y.o. female with recent dx of COVID-19 on 04/22/20, syncope and fall on 04/27/20 with L ankle fx s/p reduction and d/c home. Pt now admitted 04/30/20 with hypotension and respiratory failure due to COVID. S/p L ankle ORIF 9/4. PMH includes HLD, PSVT.   PT Comments    Pt with nausea/vomiting after OT session (RN aware), therefore session focused on seated therex ([place] HEP handout provided),and educ re: precautions, positioning, edema control, activity recommendations, DVT prevention. Pt preparing for d/c home today; will have necessary family assist. If to remain admitted, will continue to follow acutely.    Follow Up Recommendations  Home health PT;Supervision for mobility/OOB     Equipment Recommendations  Rolling walker with 5" wheels    Recommendations for Other Services       Precautions / Restrictions Precautions Precautions: Fall;Other (comment) Precaution Comments: Soft BP Restrictions Weight Bearing Restrictions: Yes LLE Weight Bearing: Non weight bearing    Mobility  Bed Mobility               General bed mobility comments: received sitting in recliner  Transfers                 General transfer comment: Deferred secondary to nausea/vomitting and feeling "woozy" - focused on seated therex  Ambulation/Gait                 Stairs             Wheelchair Mobility    Modified Rankin (Stroke Patients Only)       Balance                                            Cognition Arousal/Alertness: Awake/alert Behavior During Therapy: WFL for tasks assessed/performed Overall Cognitive Status: Within Functional Limits for tasks assessed                                        Exercises Other Exercises Other Exercises: [employer] HEP handout  (Access Code [email or web address]) provided - SLR, supine hip abd/add, quad set, LAQ, seated march - pt able to perform each 10x - encouraged performing with RLE too    General Comments        Pertinent Vitals/Pain Pain Assessment: Faces Faces Pain Scale: Hurts little more Pain Location: left ankle Pain Descriptors / Indicators: Discomfort Pain Intervention(s): Monitored during session    Home Living                      Prior Function            PT Goals (current goals can now be found in the care plan section) Progress towards PT goals: Progressing toward goals    Frequency    Min 5X/week      PT Plan Current plan remains appropriate    Co-evaluation              AM-PAC PT "6 Clicks" Mobility   Outcome Measure  Help needed turning from your back to your side while in a flat bed without using bedrails?: None Help needed moving from lying on your  back to sitting on the side of a flat bed without using bedrails?: None Help needed moving to and from a bed to a chair (including a wheelchair)?: A Little Help needed standing up from a chair using your arms (e.g., wheelchair or bedside chair)?: None Help needed to walk in hospital room?: A Little Help needed climbing 3-5 steps with a railing? : A Little 6 Click Score: 21    End of Session   Activity Tolerance: Other (comment) (limited by nausea/vomiting) Patient left: in chair;with call bell/phone within reach Nurse Communication: Mobility status;Other (comment) (request nausea medication) PT Visit Diagnosis: Other abnormalities of gait and mobility (R26.89);Muscle weakness (generalized) (M62.81)     Time: 5486-2824 PT Time Calculation (min) (ACUTE ONLY): 17 min  Charges:  $Therapeutic Exercise: 8-22 mins                    Mabeline Caras, PT, DPT Acute Rehabilitation Services  Pager [phone] Office [place] 05/14/2020, 12:16 PM

## 2020-05-14 NOTE — Progress Notes (Signed)
Pt given discharge instructions and gone over with her. She verbalized understanding. Walker delivered to room. All belongings gathered to be sent home.

## 2020-05-14 NOTE — Discharge Summary (Signed)
MACRINA Allen MBW:[id number] DOB: June 07, 1966 DOA: 04/30/2020  PCP: Patient, No Pcp Per  Admit date: 04/30/2020  Discharge date: 05/14/2020  Admitted From: Home   Disposition:  Home   Recommendations for Outpatient Follow-up:   Follow up with PCP in 1-2 weeks  PCP Please obtain BMP/CBC, 2 view CXR in 1week,  (see Discharge instructions)   PCP Please follow up on the following pending results:    Home Health: PT,OT   Equipment/Devices: Walker  Consultations: Ortho Discharge Condition: Stable    CODE STATUS: Full    Diet Recommendation: Heart Healthy   Diet Order            Diet regular Room service appropriate? Yes; Fluid consistency: Thin  Diet effective now                  Chief Complaint  Patient presents with  . Shortness of Breath     Brief history of present illness from the day of admission and additional interim summary    Debbie Allen a 54 y.o.femalewith medical history significant ofparoxysmal supraventricular tachycardia, acid reflux, hyperlipidemia presents to emergency department with worsening cough and shortness of breath.  She came to the hospital after she incurred a syncopal episode and injured her left ankle, and the hospital she was diagnosed with left ankle fracture, of note she was recently diagnosed with COVID-19 infection prior to her hospitalization on 04/22/2020 at PCP office.  She was also developing some shortness of breath along with generalized weakness, this most likely caused her syncopal episode.  He was admitted for acute hypoxic respiratory failure due to COVID-19 pneumonia along with syncope causing fall and left ankle fracture.                                                                 Hospital Course   1. Acute Hypoxic Resp. Failure due to Acute Covid 19  Viral Pneumonitis during the ongoing 2020 Covid 19 Pandemic - she moderate disease and was initially on 15 L nasal cannula oxygen, she has been treated with combination of IV steroids, remdesivir and Baricitinib, she has shown considerable improvement and now at rest she is symptom-free and on room air.  This problem is practically resolved will follow with PCP in a week.    Recent Labs  Lab 05/08/20 1029 05/09/20 1336 05/09/20 1814 05/10/20 1530 05/11/20 0132 05/11/20 0138  CRP  --   --  3.3* 2.4*  --  1.3*  DDIMER 1.00*  --  0.92* 1.09*  --  0.81*  BNP  --  76.7  --  71.3 56.8  --     Hepatic Function Latest Ref Rng & Units 05/13/2020 05/12/2020 05/11/2020  Total Protein 6.5 - 8.1 g/dL 5.5(L) 5.9(L) 5.9(L)  Albumin 3.5 - 5.0  g/dL 2.7(L) 2.7(L) 2.8(L)  AST 15 - 41 U/L 27 35 63(H)  ALT 0 - 44 U/L 93(H) 129(H) 187(H)  Alk Phosphatase 38 - 126 U/L 125 136(H) 155(H)  Total Bilirubin 0.3 - 1.2 mg/dL 0.5 0.7 0.6  Bilirubin, Direct 0.0 - 0.2 mg/dL - - -    2.  Decreased oral intake, generalized weakness, hypotension and syncope.  All due to COVID-19 infection, blood pressure is improved after hydration.  Stable echocardiogram which appears nonacute.  3.  Fall with left ankle fracture.  Reduced and splinted in the ER on 04/27/2020, she has lateral and medial malleolus fractures.  Orthopedic on board.  She underwent open reduction internal fixation of her fracture on 05/11/2020 by Dr. Victorino December, no weightbearing in left leg for at least 6 weeks then as per orthopedics, Lovenox for now for DVT prophylaxis, thereafter we will switch her to Eliquis for 3 weeks at home forming a blood clot is high due to recent Covid, fracture and inactivity.  Commence PT OT, will be discharged today with home health PT and OT.  4.  Mild transaminitis.  Asymptomatic due to COVID-19 viral infection.  Stable trend towards good improvement, PCP to repeat CMP in a week.  5.  PSVT.  Due to dehydration, currently on  low-dose metoprolol continue.  Has been adequately hydrated.  6.  Mild intermittent asthma.  Stable continue supportive care.  No wheezing.  7.  Dyslipidemia.    Continue home regimen and follow with PCP.   Discharge diagnosis     Principal Problem:   Acute hypoxemic respiratory failure due to COVID-19 [facility]) Active Problems:   PSVT (paroxysmal supraventricular tachycardia) (HCC)   Dyslipidemia   Acid reflux   Closed left ankle fracture    Discharge instructions    Discharge Instructions    Discharge instructions   Complete by: As directed    Orthopedic discharge instructions:  -Maintain postoperative splint on the left ankle at all times.  You should elevate with your "toes above nose as able.  -No weightbearing to the left lower extremity.  -Your splint should remain clean and dry at all times.  Please do not get wet. -For mild to moderate pain use Tylenol and Advil around-the-clock.  For breakthrough pain use oxycodone as necessary.  -For the prevention of blood clots I would recommend utilizing Lovenox for 2 weeks postoperatively.  We will then transition you to an 81 mg aspirin once per day x4 weeks.  -Return to see Dr. Stann Mainland in 2 weeks postoperatively for routine postop care and check.  Follow with Primary MD Patient, No Pcp Per in 7 days   Get CBC, CMP, 2 view Chest X ray -  checked next visit within 1 week by Primary MD   Activity: No weightbearing on the left leg, as tolerated with Full fall precautions use walker/cane & assistance as needed  Disposition Home   Diet: Heart Healthy    Special Instructions: If you have smoked or chewed Tobacco  in the last 2 yrs please stop smoking, stop any regular Alcohol  and or any Recreational drug use.  On your next visit with your primary care physician please Get Medicines reviewed and adjusted.  Please request your Prim.MD to go over all Hospital Tests and Procedure/Radiological results at the follow up, please  get all Hospital records sent to your Prim MD by signing hospital release before you go home.  If you experience worsening of your admission symptoms, develop shortness of breath,  life threatening emergency, suicidal or homicidal thoughts you must seek medical attention immediately by calling 911 or calling your MD immediately  if symptoms less severe.  You Must read complete instructions/literature along with all the possible adverse reactions/side effects for all the Medicines you take and that have been prescribed to you. Take any new Medicines after you have completely understood and accpet all the possible adverse reactions/side effects.   MyChart COVID-19 home monitoring program   Complete by: May 14, 2020    Is the patient willing to use the [place] for home monitoring?: Yes   No wound care   Complete by: As directed    Temperature monitoring   Complete by: May 14, 2020    After how many days would you like to receive a notification of this patient's flowsheet entries?: 1      Discharge Medications   Allergies as of 05/14/2020      Reactions   Codeine Itching      Medication List    STOP taking these medications   azithromycin 250 MG tablet Commonly known as: ZITHROMAX   HYDROcodone-acetaminophen 5-325 MG tablet Commonly known as: NORCO/VICODIN     TAKE these medications   albuterol 108 (90 Base) MCG/ACT inhaler Commonly known as: VENTOLIN HFA Inhale 2 puffs into the lungs every 6 (six) hours as needed for wheezing or shortness of breath.   apixaban 2.5 MG Tabs tablet Commonly known as: Eliquis Take 1 tablet (2.5 mg total) by mouth 2 (two) times daily.   aspirin EC 81 MG tablet Take 1 tablet (81 mg total) by mouth daily. Start taking on: June 04, 2020 What changed: These instructions start on June 04, 2020. If you are unsure what to do until then, ask your doctor or other care provider.   budesonide-formoterol 160-4.5 MCG/ACT inhaler Commonly  known as: SYMBICORT Inhale 2 puffs into the lungs daily.   docusate sodium 100 MG capsule Commonly known as: Colace Take 2 capsules (200 mg total) by mouth 2 (two) times daily as needed for mild constipation.   esomeprazole 20 MG capsule Commonly known as: NEXIUM Take 20 mg by mouth daily at 12 noon.   levocetirizine 5 MG tablet Commonly known as: XYZAL Take 5 mg by mouth daily.   metoprolol tartrate 25 MG tablet Commonly known as: LOPRESSOR TAKE ONE TABLET BY MOUTH 2 TIMES A DAY. What changed: See the new instructions.   montelukast 10 MG tablet Commonly known as: SINGULAIR Take 10 mg by mouth daily.   Olopatadine HCl 0.6 % Soln Place into the nose as needed.   ondansetron 4 MG disintegrating tablet Commonly known as: Zofran ODT Take 1 tablet (4 mg total) by mouth every 8 (eight) hours as needed for nausea or vomiting.   potassium chloride SA 20 MEQ tablet Commonly known as: KLOR-CON TAKE 2 TABLETS BY MOUTH DAILY. What changed:   how much to take  when to take this   rosuvastatin 20 MG tablet Commonly known as: CRESTOR Take 1 tablet (20 mg total) by mouth daily.   traMADol 50 MG tablet Commonly known as: ULTRAM Take 1 tablet (50 mg total) by mouth every 6 (six) hours as needed for moderate pain.   verapamil 240 MG 24 hr capsule Commonly known as: VERELAN PM TAKE ONE CAPSULE BY MOUTH ONCE DAILY. What changed: when to take this            Durable Medical Equipment  (From admission, onward)  Start     Ordered   05/13/20 1105  For home use only DME Walker rolling  Once       Comments: 5 wheel  Question Answer Comment  Walker: With [place] Wheels   Patient needs a walker to treat with the following condition Weakness      05/13/20 1104           Follow-up Information    Nicholes Stairs, MD In 2 weeks.   Specialty: Orthopedic Surgery Why: For suture removal, For wound re-check Contact information: [address] [place] [address] [phone]        Celene Squibb, MD. Call.   Specialty: Internal Medicine Why: Please make a primary care physician appointment in the next 7-10 days after your discharge home. Contact information: [facility] [address] [phone]        [facility] Follow up.   Specialty: Home Health Services Why: [facility] will be providing you with Home Health Physical Therapy and Occupational therapy for home.  They will call you in the next 24-48 hours of your discharge home to set up therapies. Contact information: [place] [address] [phone]               Major procedures and Radiology Reports - PLEASE review detailed and final reports thoroughly  -       DG Ankle Complete Left  Result Date: 04/27/2020 CLINICAL DATA:  The patient suffered left medial and lateral malleolar fractures in a fall today. Post reduction imaging. Initial encounter. EXAM: LEFT ANKLE COMPLETE - 3+ VIEW COMPARISON:  Plain films of the left ankle earlier today. FINDINGS: The patient is now in a fiberglass cast. Position and alignment of the patient's medial and lateral malleolar fractures are markedly improved. No new abnormality. IMPRESSION: Marked improvement in position and alignment of medial and lateral malleolar fractures. No new abnormality. Electronically Signed   By: Inge Rise M.D.   On: 04/27/2020 15:24   DG Ankle Complete Left  Result Date: 04/27/2020 CLINICAL DATA:  The patient suffered a left ankle injury in a fall today. Initial encounter. EXAM: LEFT ANKLE COMPLETE - 3+ VIEW COMPARISON:  None. FINDINGS: The patient has acute medial and lateral malleolar fractures. Both fractures are laterally displaced approximately 1 shaft width. The talus is laterally subluxed 1.1 cm. No other acute bony or joint abnormality is identified. There is soft tissue swelling about the patient's fractures. Pins for hallux  valgus repair in the distal first metatarsal noted. IMPRESSION: Acute, displaced medial and lateral malleolar fractures as described. Electronically Signed   By: Inge Rise M.D.   On: 04/27/2020 14:11   DG Chest Port 1 View  Result Date: 05/08/2020 CLINICAL DATA:  Shortness of breath.  Recent COVID diagnosis EXAM: PORTABLE CHEST 1 VIEW COMPARISON:  04/30/2020 FINDINGS: Stable cardiomediastinal contours. No pleural effusion or edema. Interval increase in peripheral interstitial and airspace densities within the right upper lobe and right lower lobe as well as the left midlung and left base. IMPRESSION: Interval increase in bilateral pulmonary opacities compatible with progression of multifocal infection. Electronically Signed   By: Kerby Moors M.D.   On: 05/08/2020 11:44   DG Chest Port 1 View  Result Date: 04/30/2020 CLINICAL DATA:  Cough and shortness of breath. Recent COVID diagnosis. EXAM: PORTABLE CHEST 1 VIEW COMPARISON:  Chest x-ray dated May 15, 2016. FINDINGS: The heart size and mediastinal contours  are within normal limits. Streaky linear and reticulonodular interstitial thickening and opacity at both lung bases. No focal consolidation, pleural effusion, or pneumothorax. No acute osseous abnormality. IMPRESSION: 1. Bilateral lower lobe interstitial and airspace disease, consistent with atypical infection/viral pneumonia. Electronically Signed   By: Titus Dubin M.D.   On: 04/30/2020 12:23   DG MINI C-ARM IMAGE ONLY  Result Date: 05/11/2020 There is no interpretation for this exam.  This order is for images obtained during a surgical procedure.  Please See "Surgeries" Tab for more information regarding the procedure.   ECHOCARDIOGRAM LIMITED  Result Date: 05/07/2020    ECHOCARDIOGRAM LIMITED REPORT   Patient Name:   Chiyeko S Schremp Date of Exam: 05/07/2020 Medical Rec #:  [id number]   Height:       62.0 in Accession #:    [id number]  Weight:       178.0 lb Date of Birth:  September 20, 1965   BSA:          1.819 m Patient Age:    54 years    BP:           98/73 mmHg Patient Gender: F           HR:           69 bpm. Exam Location:  Inpatient Procedure: Limited Echo, Limited Color Doppler and Cardiac Doppler Indications:    Hypotension  History:        Patient has prior history of Echocardiogram examinations, most                 recent 07/05/2015. Covid-19 Positive; Aortic Valve Disease.  Sonographer:    Mikki Santee RDCS (AE) Referring Phys: [id number] [address] IMPRESSIONS  1. Left ventricular ejection fraction, by estimation, is 55 to 60%. The left ventricle has normal function. The left ventricle has no regional wall motion abnormalities. Left ventricular diastolic parameters are consistent with Grade II diastolic dysfunction (pseudonormalization).  2. Right ventricular systolic function is normal. The right ventricular size is normal. There is normal pulmonary artery systolic pressure. The estimated right ventricular systolic pressure is 44.9 mmHg.  3. The mitral valve is normal in structure. Trivial mitral valve regurgitation. No evidence of mitral stenosis.  4. The aortic valve is tricuspid. Aortic valve regurgitation is mild. No aortic stenosis is present.  5. The inferior vena cava is normal in size with greater than 50% respiratory variability, suggesting right atrial pressure of 3 mmHg. FINDINGS  Left Ventricle: Left ventricular ejection fraction, by estimation, is 55 to 60%. The left ventricle has normal function. The left ventricle has no regional wall motion abnormalities. The left ventricular internal cavity size was normal in size. There is  no left ventricular hypertrophy. Right Ventricle: The right ventricular size is normal. No increase in right ventricular wall thickness. Right ventricular systolic function is normal. There is normal pulmonary artery systolic pressure. The tricuspid regurgitant velocity is 2.19 m/s, and  with an assumed right atrial pressure of 3 mmHg, the  estimated right ventricular systolic pressure is 67.5 mmHg. Left Atrium: Left atrial size was normal in size. Right Atrium: Right atrial size was normal in size. Mitral Valve: The mitral valve is normal in structure. Trivial mitral valve regurgitation. No evidence of mitral valve stenosis. Tricuspid Valve: The tricuspid valve is normal in structure. Tricuspid valve regurgitation is trivial. Aortic Valve: The aortic valve is tricuspid. Aortic valve regurgitation is mild. Aortic regurgitation PHT measures 750 msec. No aortic stenosis is present. Aorta:  The aortic root is normal in size and structure. Venous: The inferior vena cava is normal in size with greater than 50% respiratory variability, suggesting right atrial pressure of 3 mmHg. IAS/Shunts: No atrial level shunt detected by color flow Doppler. LEFT VENTRICLE PLAX 2D LVIDd:         4.43 cm  Diastology LVIDs:         3.30 cm  LV e' lateral:   7.29 cm/s LV PW:         0.90 cm  LV E/e' lateral: 11.6 LV IVS:        0.86 cm  LV e' medial:    6.64 cm/s LVOT diam:     1.80 cm  LV E/e' medial:  12.8 LVOT Area:     2.54 cm  LEFT ATRIUM             Index       RIGHT ATRIUM           Index LA diam:        3.40 cm 1.87 cm/m  RA Area:     14.20 cm LA Vol (A2C):   46.3 ml 25.45 ml/m RA Volume:   32.50 ml  17.86 ml/m LA Vol (A4C):   39.1 ml 21.49 ml/m LA Biplane Vol: 47.0 ml 25.83 ml/m  AORTIC VALVE AI PHT:      750 msec  AORTA Ao Root diam: 3.00 cm MITRAL VALVE               TRICUSPID VALVE MV Area (PHT): 2.91 cm    TR Peak grad:   19.2 mmHg MV Decel Time: 261 msec    TR Vmax:        219.00 cm/s MV E velocity: 84.80 cm/s MV A velocity: 80.60 cm/s  SHUNTS MV E/A ratio:  1.05        Systemic Diam: 1.80 cm Loralie Champagne MD Electronically signed by Loralie Champagne MD Signature Date/Time: 05/07/2020/4:46:48 PM    Final     Micro Results     No results found for this or any previous visit (from the past 240 hour(s)).  Today   Subjective    Kayci Siddall today has no  headache,no chest abdominal pain,no new weakness tingling or numbness, feels much better wants to go home today.     Objective   Blood pressure 115/62, pulse 69, temperature 98 F (36.7 C), temperature source Oral, resp. rate 17, height 5' 2"  (1.575 m), weight 80.7 kg, SpO2 95 %.   Intake/Output Summary (Last 24 hours) at 05/14/2020 0908 Last data filed at 05/14/2020 0700 Gross per 24 hour  Intake 960 ml  Output 900 ml  Net 60 ml    Exam  Awake Alert, No new F.N deficits, Normal affect [place].AT,PERRAL Supple Neck,No JVD, No cervical lymphadenopathy appriciated.  Symmetrical Chest wall movement, Good air movement bilaterally, CTAB RRR,No Gallops,Rubs or new Murmurs, No Parasternal Heave +ve B.Sounds, Abd Soft, Non tender, No organomegaly appriciated, No rebound -guarding or rigidity. No Cyanosis, left foot in splint   Data Review   CBC w Diff:  Lab Results  Component Value Date   WBC 8.2 05/14/2020   HGB 10.6 (L) 05/14/2020   HCT 33.7 (L) 05/14/2020   PLT 474 (H) 05/14/2020   LYMPHOPCT 14 05/14/2020   BANDSPCT 0 01/29/2017   MONOPCT 8 05/14/2020   EOSPCT 2 05/14/2020   BASOPCT 0 05/14/2020    CMP:  Lab Results  Component Value Date  NA 135 05/13/2020   K 3.3 (L) 05/13/2020   CL 99 05/13/2020   CO2 25 05/13/2020   BUN 17 05/13/2020   CREATININE 0.80 05/14/2020   CREATININE 0.76 06/16/2014   PROT 5.5 (L) 05/13/2020   ALBUMIN 2.7 (L) 05/13/2020   BILITOT 0.5 05/13/2020   ALKPHOS 125 05/13/2020   AST 27 05/13/2020   ALT 93 (H) 05/13/2020  .   Total Time in preparing paper work, data evaluation and todays exam - 54 minutes  Lala Lund M.D on 05/14/2020 at 9:08 AM  [facility]   Office  [phone]

## 2020-05-20 ENCOUNTER — Other Ambulatory Visit: Payer: Self-pay | Admitting: Cardiology

## 2020-08-27 ENCOUNTER — Other Ambulatory Visit: Payer: Self-pay | Admitting: Cardiology

## 2020-09-13 ENCOUNTER — Telehealth: Payer: Self-pay | Admitting: Gastroenterology

## 2020-09-13 NOTE — Telephone Encounter (Signed)
Patient called back and scheduled an office visit on 09/24/2020 to discuss colon.

## 2020-09-13 NOTE — Telephone Encounter (Signed)
Good morning Dr. Havery Moros, we received a referral and records for patient to be seen for recurrent cecal polyp.  Her last colon was on 08/2020.  Will be sending for records to you (DOD 09/05/20 PM) to review please.  Can you advise on scheduling?  Thank you.

## 2020-09-13 NOTE — Telephone Encounter (Signed)
Called patient and left voicemail to schedule either procedure of office visit.

## 2020-09-13 NOTE — Telephone Encounter (Signed)
I had a chance to review her records.  She has had 2 colonoscopies by [facility] general surgery in the past year, cecal polyp noted on the last exam, 10 mm in size removed with forceps, in the setting of poor prep.  She had a history of multiple adenomas polyps removed in 2020 as well.  Given poor prep on the last exam I would repeat another colonoscopy in 3 to 4 months using a double prep to reevaluate her colon and ensure no residual cecal polyp.  If she is comfortable direct booking for this procedure with me in March to April timeframe we can do that.  If she would like to meet me first and review things prior to booking a procedure that is also fine, we can book her in the office.  Thank you

## 2020-09-16 ENCOUNTER — Other Ambulatory Visit: Payer: Self-pay | Admitting: Cardiology

## 2020-09-24 ENCOUNTER — Encounter: Payer: Self-pay | Admitting: Gastroenterology

## 2020-09-24 ENCOUNTER — Ambulatory Visit: Payer: BC Managed Care – PPO | Admitting: Gastroenterology

## 2020-11-05 ENCOUNTER — Other Ambulatory Visit: Payer: Self-pay

## 2020-11-05 ENCOUNTER — Encounter: Payer: Self-pay | Admitting: Gastroenterology

## 2020-11-05 ENCOUNTER — Ambulatory Visit: Payer: BC Managed Care – PPO | Admitting: Gastroenterology

## 2020-11-05 VITALS — BP 120/60 | HR 60 | Ht 62.0 in | Wt 188.0 lb

## 2020-11-05 DIAGNOSIS — R194 Change in bowel habit: Secondary | ICD-10-CM | POA: Diagnosis not present

## 2020-11-05 DIAGNOSIS — Z86010 Personal history of colonic polyps: Secondary | ICD-10-CM | POA: Diagnosis not present

## 2020-11-05 MED ORDER — POLYETHYLENE GLYCOL 3350 17 G PO PACK: 17.0000 g | PACK | Freq: Two times a day (BID) | ORAL | 0 refills | Status: DC

## 2020-11-05 MED ORDER — SUTAB 1479-225-188 MG PO TABS: 1.0000 | ORAL_TABLET | Freq: Once | ORAL | 0 refills | Status: AC

## 2020-11-05 NOTE — Progress Notes (Signed)
HPI :  55 year old female with a history of colon polyps, history of severe Covid 19 infection last year, history of PSVT, referred here by Adelina Mings MD for history of colon polyps.   She has had 2 colonoscopies since November 2020 as outlined below  Colonoscopy 07/17/19 - Lindalou Hose MD - She had three polyps. (1) Polyp at 45 cm: Tubular adenoma 0.4 cm (2) Cecal polyp: Tubular adenoma, 7 fragments on pathology ranging from 0.3 to 0.7 cm (taken in 2 segments during the colonoscopy), (3) splenic flexure polyp: Tubular adenoma 0.5 cm  Colonoscopy 08/23/20 - Lindalou Hose MD - Preparation of the colon was poor. One 10 mm adenomatous polyp in the cecum, removed with a cold biopsy forceps. Resected and retrieved.    Patient states she is here to schedule a colonoscopy.  As above she had multiple polyps removed in November 2020 and then surveillance exam in December 2021 was remarkable for a poor prep but she did have a recurrent polyp in the cecum that was difficult to remove.  Patient has no blood in her stools or routine significant abdominal pains, has occasional lower abdominal discomfort that appear to be related to her bowel movements..  She does have some altered bowel habits and states she has a sense of incomplete evacuation and some constipation at times.  She did go on MiraLAX in the past at once daily dose which did increase her frequency slightly.  She denies any history of colon cancer in the family or strong family history of colon polyps.  Her colonoscopy in November 2020 was her first exam.  She did have a severe Covid infection last August for which she was mated to the hospital for a few weeks.  She states she had some elevated liver enzymes at that time which have since been downtrending and has follow-up blood work with her primary care for that later this month.  She was placed on Eliquis to prevent blood clots during that time but has since been off of it.  She states she  has slowly recovered from this and tolerated anesthesia well during her last colonoscopy.  She does have a history of PSVT which is controlled on verapamil and Lopressor.  She denies any chest pains or shortness of breath.  Echocardiogram 05/07/20 - EF 55-60%, grade II DD  Past Medical History:  Diagnosis Date  . Acid reflux   . Aortic regurgitation    Asymptomatic  . Asthma   . Brain tumor (benign) ([place])    had concussion as a child and they think its scar tissue on her scalp  . Colon polyps   . 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 PSVT (paroxysmal supraventricular tachycardia) ([facility])    Palpitations - suspected diagnosis (LGL postulated by Dr. Debara Pickett but never documented)  . Umbilical hernia      Past Surgical History:  Procedure Laterality Date  . COLONOSCOPY  07/2019  . DILATION AND CURETTAGE OF UTERUS    . LEFT HEART CATH    . ORIF ANKLE FRACTURE Left 05/11/2020   Procedure: OPEN REDUCTION INTERNAL FIXATION (ORIF) ANKLE FRACTURE;  Surgeon: Nicholes Stairs, MD;  Location: [facility];  Service: Orthopedics;  Laterality: Left;   Family History  Problem Relation Age of Onset  . Heart disease Father 54  . Hyperlipidemia Father   . Colon polyps Father  benign  . Hyperlipidemia Sister   . Hypertension Paternal Grandmother   . Heart disease Paternal Grandfather   . Heart attack Paternal Grandfather   . Colon cancer Neg Hx   . Esophageal cancer Neg Hx   . Rectal cancer Neg Hx    Social History   Tobacco Use  . Smoking status: Never Smoker  . Smokeless tobacco: Never Used  Vaping Use  . Vaping Use: Never used  Substance Use Topics  . Alcohol use: Yes    Alcohol/week: 0.0 standard drinks    Comment: Occasional  . Drug use: No   Current Outpatient Medications  Medication Sig Dispense Refill  . albuterol (PROVENTIL HFA;VENTOLIN HFA) 108 (90 BASE) MCG/ACT inhaler Inhale 2  puffs into the lungs every 6 (six) hours as needed for wheezing or shortness of breath.    Marland Kitchen aspirin EC 81 MG tablet Take 1 tablet (81 mg total) by mouth daily. 90 tablet 3  . budesonide-formoterol (SYMBICORT) 160-4.5 MCG/ACT inhaler Inhale 2 puffs into the lungs daily.     Marland Kitchen esomeprazole (NEXIUM) 20 MG capsule Take 20 mg by mouth daily at 12 noon.    Marland Kitchen levocetirizine (XYZAL) 5 MG tablet Take 5 mg by mouth daily.    . metoprolol tartrate (LOPRESSOR) 25 MG tablet Take 1 tablet (25 mg total) by mouth 2 (two) times daily. 180 tablet 2  . montelukast (SINGULAIR) 10 MG tablet Take 10 mg by mouth daily.     . Olopatadine HCl 0.6 % SOLN Place into the nose as needed.    . potassium chloride SA (KLOR-CON) 20 MEQ tablet TAKE 2 TABLETS BY MOUTH DAILY. (Patient taking differently: Take 20 mEq by mouth 2 (two) times daily.) 180 tablet 3  . rosuvastatin (CRESTOR) 20 MG tablet TAKE ONE TABLET BY MOUTH ONCE DAILY. 90 tablet 3  . verapamil (VERELAN PM) 240 MG 24 hr capsule Take 1 capsule (240 mg total) by mouth at bedtime. 90 capsule 3   No current facility-administered medications for this visit.   Allergies  Allergen Reactions  . Codeine Itching     Review of Systems: All systems reviewed and negative except where noted in HPI.   Labs reviewed in care everywhere and Epic  Physical Exam: BP 120/60   Pulse 60   Ht 5\' 2"  (1.575 m)   Wt 188 lb (85.3 kg)   BMI 34.39 kg/m  Constitutional: Pleasant,well-developed, female in no acute distress. HEENT: Normocephalic and atraumatic. Conjunctivae are normal. No scleral icterus. Neck supple.  Cardiovascular: Normal rate, regular rhythm.  Pulmonary/chest: Effort normal and breath sounds normal. No wheezing, rales or rhonchi. Abdominal: Soft, nondistended, nontender. There are no masses palpable.  Extremities: no edema Lymphadenopathy: No cervical adenopathy noted. Neurological: Alert and oriented to person place and time. Skin: Skin is warm and dry. No  rashes noted. Psychiatric: Normal mood and affect. Behavior is normal.   ASSESSMENT AND PLAN: 55 year old female here for new patient assessment of the following:  History of colon polyps Altered bowel habits  As above, the patient has had multiple colon polyps removed over the past 2 years, sounds like she had a residual or recurrent polyp in her cecum on her last exam about 1cm in size that was attempted to be removed with forceps.  Unfortunately she had a poor prep at that time and difficult to evaluate for other polyps.  Based on description of the last exam, I do think she warrants a follow-up colonoscopy to ensure the cecal polyp is  gone and provide further endoscopic therapy to remove it if residual polyp remains.  Discussed with her that there may be scarring from prior polypectomy attempts which could make this lesion more challenging to be removed than usual.  We discussed risks for bleeding and perforation with polypectomy removal in the cecum, but the overall risk remains low.  It been about 3 months since her last exam and I think reasonable to proceed at this time with another colonoscopy for this purpose.  Given her baseline constipation I recommend increasing her MiraLAX to twice daily dosing at this time and would recommend a double bowel prep for her exam to ensure she is adequately prepared.  Hopefully we can address this polyp and remove it in the office setting safely.  Following discussion of risks and benefits of colonoscopy and anesthesia she wants to proceed.  Further recommendations pending results and her course.  All questions answered.  Manvel Cellar, MD [facility]  CC: Adelina Mings, MD

## 2020-11-05 NOTE — Patient Instructions (Addendum)
If you are age 55 or older, your body mass index should be between 23-30. Your Body mass index is 34.39 kg/m. If this is out of the aforementioned range listed, please consider follow up with your Primary Care Provider.  If you are age 98 or younger, your body mass index should be between 19-25. Your Body mass index is 34.39 kg/m. If this is out of the aformentioned range listed, please consider follow up with your Primary Care Provider.   You have been scheduled for a colonoscopy. Please follow written instructions given to you at your visit today.  Please pick up your prep supplies at the pharmacy within the next 1-3 days. If you use inhalers (even only as needed), please bring them with you on the day of your procedure.   Take Miralax over-the-counter twice a day until 2 days before your procedure. Then follow the instructions for your colonoscopy that we went through with you today.  Thank you for entrusting me with your care and for choosing [facility], Dr. Babbie Cellar

## 2020-11-08 ENCOUNTER — Ambulatory Visit (AMBULATORY_SURGERY_CENTER): Payer: BC Managed Care – PPO | Admitting: Gastroenterology

## 2020-11-08 ENCOUNTER — Encounter: Payer: Self-pay | Admitting: Gastroenterology

## 2020-11-08 ENCOUNTER — Other Ambulatory Visit: Payer: Self-pay

## 2020-11-08 VITALS — BP 111/63 | HR 51 | Temp 96.6°F | Resp 17 | Ht 62.0 in | Wt 188.0 lb

## 2020-11-08 DIAGNOSIS — Z86010 Personal history of colonic polyps: Secondary | ICD-10-CM | POA: Diagnosis not present

## 2020-11-08 DIAGNOSIS — D123 Benign neoplasm of transverse colon: Secondary | ICD-10-CM

## 2020-11-08 DIAGNOSIS — R194 Change in bowel habit: Secondary | ICD-10-CM

## 2020-11-08 MED ORDER — SODIUM CHLORIDE 0.9 % IV SOLN: 500.0000 mL | Freq: Once | INTRAVENOUS | Status: DC

## 2020-11-08 NOTE — Progress Notes (Signed)
VS-CW 

## 2020-11-08 NOTE — Patient Instructions (Signed)
YOU HAD AN ENDOSCOPIC PROCEDURE TODAY AT THE [facility]:   Refer to the procedure report that was given to you for any specific questions about what was found during the examination.  If the procedure report does not answer your questions, please call your gastroenterologist to clarify.  If you requested that your care partner not be given the details of your procedure findings, then the procedure report has been included in a sealed envelope for you to review at your convenience later.  YOU SHOULD EXPECT: Some feelings of bloating in the abdomen. Passage of more gas than usual.  Walking can help get rid of the air that was put into your GI tract during the procedure and reduce the bloating. If you had a lower endoscopy (such as a colonoscopy or flexible sigmoidoscopy) you may notice spotting of blood in your stool or on the toilet paper. If you underwent a bowel prep for your procedure, you may not have a normal bowel movement for a few days.  Please Note:  You might notice some irritation and congestion in your nose or some drainage.  This is from the oxygen used during your procedure.  There is no need for concern and it should clear up in a day or so.  SYMPTOMS TO REPORT IMMEDIATELY:   Following lower endoscopy (colonoscopy or flexible sigmoidoscopy):  Excessive amounts of blood in the stool  Significant tenderness or worsening of abdominal pains  Swelling of the abdomen that is new, acute  Fever of 100F or higher   Following upper endoscopy (EGD)  Vomiting of blood or coffee ground material  New chest pain or pain under the shoulder blades  Painful or persistently difficult swallowing  New shortness of breath  Fever of 100F or higher  Black, tarry-looking stools  For urgent or emergent issues, a gastroenterologist can be reached at any hour by calling [phone]. Do not use MyChart messaging for urgent concerns.    DIET:  We do recommend a [place] meal at first, but  then you may proceed to your regular diet.  Drink plenty of fluids but you should avoid alcoholic beverages for 24 hours.  ACTIVITY:  You should plan to take it easy for the rest of today and you should NOT DRIVE or use heavy machinery until tomorrow (because of the sedation medicines used during the test).    FOLLOW UP: Our staff will call the number listed on your records 48-72 hours following your procedure to check on you and address any questions or concerns that you may have regarding the information given to you following your procedure. If we do not reach you, we will leave a message.  We will attempt to reach you two times.  During this call, we will ask if you have developed any symptoms of COVID 19. If you develop any symptoms (ie: fever, flu-like symptoms, shortness of breath, cough etc.) before then, please call [phone].  If you test positive for Covid 19 in the 2 weeks post procedure, please call and report this information to us.    If any biopsies were taken you will be contacted by phone or by letter within the next 1-3 weeks.  Please call us at [phone] if you have not heard about the biopsies in 3 weeks.    SIGNATURES/CONFIDENTIALITY: You and/or your care partner have signed paperwork which will be entered into your electronic medical record.  These signatures attest to the fact that that the information above on   your After Visit Summary has been reviewed and is understood.  Full responsibility of the confidentiality of this discharge information lies with you and/or your care-partner. 

## 2020-11-08 NOTE — Op Note (Signed)
[facility] Patient Name: Jaidah Group Procedure Date: 11/08/2020 10:29 AM MRN: [id number] Endoscopist: Remo Lipps P. Havery Moros , MD Age: 55 Referring MD:  Date of Birth: [birth year] Gender: Female Account #: [id number] Procedure:                Colonoscopy Indications:              High risk colon cancer surveillance: Personal                            history of colonic polyps (difficult to remove                            cecal adenoma on last 2 exams, last done 12/21,                            [facility]), here to assess for residual / recurrent                            adenomatous polyp. Double prep used for this exam                            in light of history of inadequate preps. Medicines:                Monitored Anesthesia Care Procedure:                Pre-Anesthesia Assessment:                           - Prior to the procedure, a History and Physical                            was performed, and patient medications and                            allergies were reviewed. The patient's tolerance of                            previous anesthesia was also reviewed. The risks                            and benefits of the procedure and the sedation                            options and risks were discussed with the patient.                            All questions were answered, and informed consent                            was obtained. Prior Anticoagulants: The patient has                            taken no previous anticoagulant or antiplatelet  agents. ASA Grade Assessment: III - A patient with                            severe systemic disease. After reviewing the risks                            and benefits, the patient was deemed in                            satisfactory condition to undergo the procedure.                           After obtaining informed consent, the colonoscope                            was passed under direct  vision. Throughout the                            procedure, the patient's blood pressure, pulse, and                            oxygen saturations were monitored continuously. The                            Olympus PFC-[id number]) Colonoscope was                            introduced through the anus and advanced to the the                            cecum, identified by appendiceal orifice and                            ileocecal valve. The colonoscopy was performed                            without difficulty. The patient tolerated the                            procedure well. The quality of the bowel                            preparation was good. The ileocecal valve,                            appendiceal orifice, and rectum were photographed. Scope In: 10:47:13 AM Scope Out: 11:13:50 AM Scope Withdrawal Time: 0 hours 23 minutes 14 seconds  Total Procedure Duration: 0 hours 26 minutes 37 seconds  Findings:                 The perianal and digital rectal examinations were                            normal.  A medium post polypectomy scar was found in the                            cecum. The scar tissue was healthy in appearance.                            Multiple passes made in the right colon, including                            retroflexed views of the IC valve. No residual /                            recurrent polyp tissue appreciated. The IC valve                            was lipomatous.                           A 12 to 15 mm polyp was found in the transverse                            colon. The polyp was sessile. The polyp was removed                            with a cold snare. Resection and retrieval were                            complete.                           A 5 mm polyp was found in the transverse colon. The                            polyp was sessile. The polyp was removed with a                            cold snare.  Resection and retrieval were complete.                           Multiple medium-mouthed diverticula were found in                            the distal transverse colon and left colon.                           Internal hemorrhoids were found during retroflexion.                           The exam was otherwise without abnormality. Complications:            No immediate complications. Estimated blood loss:                            Minimal. Estimated Blood  Loss:     Estimated blood loss was minimal. Impression:               - Post-polypectomy scar in the cecum without                            obvious recurrence / residual polyp.                           - One 12 to 15 mm polyp in the transverse colon,                            removed with a cold snare. Resected and retrieved.                           - One 5 mm polyp in the transverse colon, removed                            with a cold snare. Resected and retrieved.                           - Diverticulosis in the distal transverse colon and                            in the left colon.                           - Internal hemorrhoids.                           - The examination was otherwise normal. Recommendation:           - Patient has a contact number available for                            emergencies. The signs and symptoms of potential                            delayed complications were discussed with the                            patient. Return to normal activities tomorrow.                            Written discharge instructions were provided to the                            patient.                           - Resume previous diet.                           - Continue present medications.                           -  Await pathology results.                           - Anticipate repeat colonoscopy in 3 years for                            surveillance purposes Carlota Raspberry. Juliett Eastburn, MD 11/08/2020 11:21:06  AM This report has been signed electronically.

## 2020-11-08 NOTE — Progress Notes (Signed)
pt tolerated well. VSS. awake and to recovery. Report given to RN.  

## 2020-11-08 NOTE — Progress Notes (Signed)
Called to room to assist during endoscopic procedure.  Patient ID and intended procedure confirmed with present staff. Received instructions for my participation in the procedure from the performing physician.  

## 2020-11-12 ENCOUNTER — Telehealth: Payer: Self-pay

## 2020-11-12 NOTE — Telephone Encounter (Signed)
  Follow up Call-  Call back number 11/08/2020  Post procedure Call Back phone  # [phone]  Permission to leave phone message Yes  Some recent data might be hidden     Patient questions:  Do you have a fever, pain , or abdominal swelling? No. Pain Score  0 *  Have you tolerated food without any problems? Yes.    Have you been able to return to your normal activities? Yes.    Do you have any questions about your discharge instructions: Diet   No. Medications  No. Follow up visit  No.  Do you have questions or concerns about your Care? No.  Actions: * If pain score is 4 or above: No action needed, pain <4.  1. Have you developed a fever since your procedure? no  2.   Have you had an respiratory symptoms (SOB or cough) since your procedure? no  3.   Have you tested positive for COVID 19 since your procedure no  4.   Have you had any family members/close contacts diagnosed with the COVID 19 since your procedure?  no   If yes to any of these questions please route to Joylene John, RN and Joella Prince, RN

## 2020-11-12 NOTE — Telephone Encounter (Signed)
First post procedure follow up call, no answer 

## 2020-11-20 NOTE — Progress Notes (Signed)
Cardiology Office Note  Date: 11/21/2020   ID: Debbie Allen, DOB 04-17-1966, MRN [id number]  PCP:  Celene Squibb, MD  Cardiologist:  Rozann Lesches, MD Electrophysiologist:  None   Chief Complaint: Vertigo, chest heaviness/neck heaviness, dizziness, sinus pressure  History of Present Illness: Debbie Allen is a 55 y.o. female with a history of PSVT, intramyocardial bridging of septal perforator as well as anomalous origin of RCA.  Mixed hyperlipidemia.  Last seen by Dr. Domenic Polite 12/06/2019.  She denied any palpitations or angina.  Medications were stable from cardiac perspective.  Lipid panel was ordered.  EKG demonstrated normal sinus rhythm with poor R wave progression.  Hospitalization on 04/22/2020 at PCP office.  She was having some shortness of breath but generalized weakness.  She was admitted for acute hypoxic respiratory failure due to COVID-19 pneumonia along with syncope causing fall and left ankle fracture.  She was treated with combination of IV steroids, remdesivir and baricitinib.  Patient here with complaints of recent vertigo and primary care provider placed her on steroids and antibiotics for sinusitis.  She is concerned about a heavy feeling on her left chest and neck.  Having some dizziness and sinus pressure.  States she has been working out and exercising at the gym recently and has occasional dizziness.  Occasional palpitations which are short-lived.  She denies any near syncope or syncopal episodes.  Describes a sense of heaviness on her left chest and neck which can occur with and without activity.  She denies any associated symptoms i.e. diaphoresis, nausea, vomiting.  States this has only happened a couple of times.  Describes occasional brief palpitations.  Blood pressure is elevated today but she states she is being treated for sinus infection with prednisone and antibiotics.  Advised her to purchase a blood pressure cuff to monitor.  Ideal blood pressure is less than  130/80 sustained over time.  Past Medical History:  Diagnosis Date  . Acid reflux   . Allergy   . Aortic regurgitation    Asymptomatic  . Arthritis   . Asthma   . Brain tumor (benign) ([facility])    had concussion as a child and they think its scar tissue on her scalp  . Colon polyps   . Dysrhythmia    "heartbeat can be either really fast or slow"  . Heart murmur   . 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 Hyperlipidemia   . Neuromuscular disorder ([facility])    nerve damage to Left ankle and foot from fracture and surgery  . PSVT (paroxysmal supraventricular tachycardia) ([facility])    Palpitations - suspected diagnosis (LGL postulated by Dr. Debara Pickett but never documented)  . Umbilical hernia     Past Surgical History:  Procedure Laterality Date  . COLONOSCOPY  07/2019  . DILATION AND CURETTAGE OF UTERUS    . LEFT HEART CATH    . ORIF ANKLE FRACTURE Left 05/11/2020   Procedure: OPEN REDUCTION INTERNAL FIXATION (ORIF) ANKLE FRACTURE;  Surgeon: Nicholes Stairs, MD;  Location: [facility];  Service: Orthopedics;  Laterality: Left;    Current Outpatient Medications  Medication Sig Dispense Refill  . albuterol (PROVENTIL HFA;VENTOLIN HFA) 108 (90 BASE) MCG/ACT inhaler Inhale 2 puffs into the lungs every 6 (six) hours as needed for wheezing or shortness of breath.    Marland Kitchen amoxicillin-clavulanate (AUGMENTIN) 875-125 MG tablet Take 1 tablet by mouth 2 (two) times daily.    Marland Kitchen  aspirin EC 81 MG tablet Take 1 tablet (81 mg total) by mouth daily. 90 tablet 3  . budesonide-formoterol (SYMBICORT) 160-4.5 MCG/ACT inhaler Inhale 2 puffs into the lungs daily.     Marland Kitchen EPINEPHrine 0.3 mg/0.3 mL IJ SOAJ injection SMARTSIG:1 Pre-Filled Pen Syringe IM As Directed    . esomeprazole (NEXIUM) 20 MG capsule Take 20 mg by mouth daily at 12 noon.    Marland Kitchen guaiFENesin (MUCINEX) 600 MG 12 hr tablet Take 600 mg by mouth 2 (two) times daily.     Marland Kitchen ketotifen (ZADITOR) 0.025 % ophthalmic solution 1 drop into affected eye    . levocetirizine (XYZAL) 5 MG tablet Take 5 mg by mouth daily.    . meclizine (ANTIVERT) 12.5 MG tablet Take 25 mg by mouth 3 (three) times daily as needed for dizziness.    . metoprolol tartrate (LOPRESSOR) 25 MG tablet Take 1 tablet (25 mg total) by mouth 2 (two) times daily. 180 tablet 2  . montelukast (SINGULAIR) 10 MG tablet Take 10 mg by mouth daily.     . Olopatadine HCl 0.6 % SOLN Place into the nose as needed.    . potassium chloride SA (KLOR-CON) 20 MEQ tablet TAKE 2 TABLETS BY MOUTH DAILY. (Patient taking differently: Take 20 mEq by mouth 2 (two) times daily.) 180 tablet 3  . predniSONE (DELTASONE) 10 MG tablet Take 10-60 mg by mouth as directed.    . rosuvastatin (CRESTOR) 20 MG tablet TAKE ONE TABLET BY MOUTH ONCE DAILY. 90 tablet 3  . verapamil (VERELAN PM) 240 MG 24 hr capsule Take 1 capsule (240 mg total) by mouth at bedtime. 90 capsule 3   No current facility-administered medications for this visit.   Allergies:  Codeine   Social History: The patient  reports that she has never smoked. She has never used smokeless tobacco. She reports current alcohol use. She reports that she does not use drugs.   Family History: The patient's family history includes Colon polyps in her father; Heart attack in her paternal grandfather; Heart disease in her paternal grandfather; Heart disease (age of onset: 12) in her father; Hyperlipidemia in her father and sister; Hypertension in her paternal grandmother.   ROS:  Please see the history of present illness. Otherwise, complete review of systems is positive for none.  All other systems are reviewed and negative.   Physical Exam: VS:  BP 140/87 (BP Location: Right Arm, Cuff Size: Normal)   Pulse 74   Ht 5\' 2"  (1.575 m)   Wt 189 lb 9.6 oz (86 kg)   SpO2 98%   BMI 34.68 kg/m , BMI Body mass index is 34.68 kg/m.  Wt Readings from Last 3 Encounters:  11/21/20 189  lb 9.6 oz (86 kg)  11/08/20 188 lb (85.3 kg)  11/05/20 188 lb (85.3 kg)    General: Patient appears comfortable at rest. Neck: Supple, no elevated JVP or carotid bruits, no thyromegaly. Lungs: Clear to auscultation, nonlabored breathing at rest. Cardiac: Regular rate and rhythm, no S3 or significant systolic murmur, no pericardial rub. Extremities: No pitting edema, distal pulses 2+. Skin: Warm and dry. Musculoskeletal: No kyphosis. Neuropsychiatric: Alert and oriented x3, affect grossly appropriate.  ECG:  An ECG dated 11/21/2020 was personally reviewed today and demonstrated:  Normal sinus rhythm rate of 71.  Recent Labwork: 05/11/2020: B Natriuretic Peptide 56.8; Magnesium 2.3 05/13/2020: ALT 93; AST 27; BUN 17; Potassium 3.3; Sodium 135 05/14/2020: Creatinine, Ser 0.80; Hemoglobin 10.6; Platelets 474     Component  Value Date/Time   CHOL 178 03/19/2020 0914   TRIG 252 (H) 04/30/2020 1241   HDL 52 03/19/2020 0914   CHOLHDL 3.4 03/19/2020 0914   VLDL 59 (H) 03/19/2020 0914   LDLCALC 67 03/19/2020 0914    Other Studies Reviewed Today:  Echocardiogram  05/07/2020 1. Left ventricular ejection fraction, by estimation, is 55 to 60%. The left ventricle has normal function. The left ventricle has no regional wall motion abnormalities. Left ventricular diastolic parameters are consistent with Grade II diastolic dysfunction (pseudonormalization). 2. Right ventricular systolic function is normal. The right ventricular size is normal. There is normal pulmonary artery systolic pressure. The estimated right ventricular systolic pressure is 87.5 mmHg. 3. The mitral valve is normal in structure. Trivial mitral valve regurgitation. No evidence of mitral stenosis. 4. The aortic valve is tricuspid. Aortic valve regurgitation is mild. No aortic stenosis is present. 5. The inferior vena cava is normal in size with greater than 50% respiratory variability, suggesting right atrial pressure of 3  mmHg.    Lexiscan Cardiolite 06/21/2014: FINDINGS: Stress/ECG data: The patient was stressed according to the Lexiscan protocol. The heart rate ranged from 69 to 115 beats per min. The blood pressure averaged 131/88. The patient experienced mild chest tightness with Lexiscan infusion.  Resting ECG demonstrated normal sinus rhythm. With stress, there were no ischemic ST segment or T-wave abnormalities, nor any arrhythmias.  Perfusion: No decreased activity in the left ventricle on stress imaging to suggest reversible ischemia or infarction.  Wall Motion: Normal left ventricular wall motion. No left ventricular dilation.  Left Ventricular Ejection Fraction: 51 %  End diastolic volume 68 ml  End systolic volume 33 ml  IMPRESSION: 1. No reversible ischemia or infarction.  2. Normal left ventricular wall motion.  3. Left ventricular ejection fraction 51%  4. Low-risk stress test findings*.  Echocardiogram 07/05/2015: Study Conclusions  - Left ventricle: The cavity size was normal. Wall thickness was normal. Systolic function was normal. The estimated ejection fraction was in the range of 60% to 65%. Wall motion was normal; there were no regional wall motion abnormalities. Left ventricular diastolic function parameters were normal. - Aortic valve: There was mild to moderate regurgitation. Regurgitation pressure half-time: 848 ms. - Mitral valve: There was mild regurgitation. - Tricuspid valve: There was mild regurgitation.  Assessment and Plan:  1. Chest pressure   2. Dizziness   3. PSVT (paroxysmal supraventricular tachycardia) ([place])   4. Myocardial bridge   5. Anomalous origin of right coronary artery    1. Chest pressure Describes some recent stress pressure with left arm discomfort occurring with and without exertion.  States this has only happened a couple of times.  No associated symptoms.  Continue aspirin 81 mg daily.  Continue  rosuvastatin 20 mg daily.  Symptoms do not appear to be ischemic in origin.  However she does have intramyocardial bridging of the septal perforator.  She has been working out at [employer] and does not always complain of pressure when performing exercise.  Advised her if symptoms became more persistent or she had more associated symptoms we would likely perform another ischemic evaluation in the form of a stress test.  2. Dizziness States she has had some recent episodes of mild dizziness but no near syncopal or syncopal episodes.  States she has occasional brief palpitations but nothing significant.  3. PSVT (paroxysmal supraventricular tachycardia) (HCC) Mentions occasional brief palpitations which are not bothersome.  She continues on metoprolol 25 mg p.o. twice daily, verapamil  240 mg daily.  4. Myocardial bridge/anomalous origin of right coronary artery Intramyocardial bridging of septal perforator /anomalous origin of right coronary artery.    Medication Adjustments/Labs and Tests Ordered: Current medicines are reviewed at length with the patient today.  Concerns regarding medicines are outlined above.   Disposition: Follow-up with Dr. Domenic Polite or APP 6 months.  Signed, Levell July, NP 11/21/2020 9:41 AM    [facility] [place] [place], [place], [place] [address] Phone: [phone]; Fax: [phone]

## 2020-11-21 ENCOUNTER — Encounter: Payer: Self-pay | Admitting: Family Medicine

## 2020-11-21 ENCOUNTER — Ambulatory Visit: Payer: BC Managed Care – PPO | Admitting: Family Medicine

## 2020-11-21 VITALS — BP 140/87 | HR 74 | Ht 62.0 in | Wt 189.6 lb

## 2020-11-21 DIAGNOSIS — R42 Dizziness and giddiness: Secondary | ICD-10-CM | POA: Diagnosis not present

## 2020-11-21 DIAGNOSIS — I471 Supraventricular tachycardia: Secondary | ICD-10-CM

## 2020-11-21 DIAGNOSIS — R0789 Other chest pain: Secondary | ICD-10-CM

## 2020-11-21 DIAGNOSIS — Q245 Malformation of coronary vessels: Secondary | ICD-10-CM | POA: Diagnosis not present

## 2020-11-21 NOTE — Patient Instructions (Signed)
Medication Instructions:  Continue all current medications.   Labwork: none  Testing/Procedures: none  Follow-Up: 6 months   Any Other Special Instructions Will Be Listed Below (If Applicable).   If you need a refill on your cardiac medications before your next appointment, please call your pharmacy.  

## 2020-11-21 NOTE — Addendum Note (Signed)
Addended by: Merlene Laughter on: 11/21/2020 04:16 PM   Modules accepted: Orders

## 2020-12-11 ENCOUNTER — Other Ambulatory Visit: Payer: Self-pay | Admitting: Cardiology

## 2020-12-18 ENCOUNTER — Other Ambulatory Visit (HOSPITAL_COMMUNITY): Payer: Self-pay | Admitting: Internal Medicine

## 2020-12-18 ENCOUNTER — Other Ambulatory Visit: Payer: Self-pay | Admitting: Internal Medicine

## 2020-12-18 DIAGNOSIS — D329 Benign neoplasm of meninges, unspecified: Secondary | ICD-10-CM

## 2021-01-03 ENCOUNTER — Ambulatory Visit (HOSPITAL_COMMUNITY)
Admission: RE | Admit: 2021-01-03 | Discharge: 2021-01-03 | Disposition: A | Discharge status: Home / Self Care | Payer: BC Managed Care – PPO | Source: Ambulatory Visit | Attending: Internal Medicine | Admitting: Internal Medicine

## 2021-01-03 DIAGNOSIS — D329 Benign neoplasm of meninges, unspecified: Secondary | ICD-10-CM | POA: Insufficient documentation

## 2021-01-03 MED ORDER — GADOBUTROL 1 MMOL/ML IV SOLN
7.5000 mL | Freq: Once | INTRAVENOUS | Status: AC | PRN
  Administered 2021-01-03: 7.5 mL via INTRAVENOUS

## 2021-02-24 ENCOUNTER — Other Ambulatory Visit (HOSPITAL_COMMUNITY): Payer: Self-pay | Admitting: Family Medicine

## 2021-02-24 DIAGNOSIS — R748 Abnormal levels of other serum enzymes: Secondary | ICD-10-CM

## 2021-03-05 ENCOUNTER — Other Ambulatory Visit: Payer: Self-pay

## 2021-03-05 ENCOUNTER — Ambulatory Visit (HOSPITAL_COMMUNITY)
Admission: RE | Admit: 2021-03-05 | Discharge: 2021-03-05 | Disposition: A | Discharge status: Home / Self Care | Payer: BC Managed Care – PPO | Source: Ambulatory Visit | Attending: Family Medicine | Admitting: Family Medicine

## 2021-03-05 DIAGNOSIS — R748 Abnormal levels of other serum enzymes: Secondary | ICD-10-CM | POA: Insufficient documentation

## 2021-05-15 ENCOUNTER — Other Ambulatory Visit: Payer: Self-pay | Admitting: Cardiology

## 2021-05-29 ENCOUNTER — Other Ambulatory Visit: Payer: Self-pay | Admitting: Cardiology

## 2021-05-30 ENCOUNTER — Encounter: Payer: Self-pay | Admitting: Cardiology

## 2021-05-30 ENCOUNTER — Ambulatory Visit: Payer: BC Managed Care – PPO | Admitting: Cardiology

## 2021-05-30 NOTE — Progress Notes (Deleted)
Cardiology Office Note  Date: 05/30/2021   ID: Debbie Allen, DOB Jan 27, 1966, MRN [id number]  PCP:  Celene Squibb, MD  Cardiologist:  Rozann Lesches, MD Electrophysiologist:  None   No chief complaint on file.   History of Present Illness: Debbie Allen is a 55 y.o. female last seen in March by Mr. Leonides Sake NP.  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 Debara Pickett but never documented)   Umbilical hernia     Past Surgical History:  Procedure Laterality Date   COLONOSCOPY  07/2019   DILATION AND CURETTAGE OF UTERUS     LEFT HEART CATH     ORIF ANKLE FRACTURE Left 05/11/2020   Procedure: OPEN REDUCTION INTERNAL FIXATION (ORIF) ANKLE FRACTURE;  Surgeon: Nicholes Stairs, MD;  Location: [facility];  Service: Orthopedics;  Laterality: Left;    Current Outpatient Medications  Medication Sig Dispense Refill   albuterol (PROVENTIL HFA;VENTOLIN HFA) 108 (90 BASE) MCG/ACT inhaler Inhale 2 puffs into the lungs every 6 (six) hours as needed for wheezing or shortness of breath.     aspirin EC 81 MG tablet Take 1 tablet (81 mg total) by mouth daily. 90 tablet 3   budesonide-formoterol (SYMBICORT) 160-4.5 MCG/ACT inhaler Inhale 2 puffs into the lungs daily.      EPINEPHrine 0.3 mg/0.3 mL IJ SOAJ injection SMARTSIG:1 Pre-Filled Pen Syringe IM As Directed     esomeprazole (NEXIUM) 20 MG capsule Take 20 mg by mouth daily at 12 noon.     guaiFENesin (MUCINEX) 600 MG 12 hr tablet Take 600 mg by mouth 2 (two) times daily.     ketotifen (ZADITOR)  0.025 % ophthalmic solution 1 drop into affected eye     levocetirizine (XYZAL) 5 MG tablet Take 5 mg by mouth daily.     meclizine (ANTIVERT) 12.5 MG tablet Take 25 mg by mouth 3 (three) times daily as needed for dizziness.     metoprolol tartrate (LOPRESSOR) 25 MG tablet TAKE ONE TABLET BY MOUTH 2 TIMES A DAY. 180 tablet 0   montelukast (SINGULAIR) 10 MG tablet Take 10 mg by mouth daily.      Olopatadine HCl 0.6 % SOLN Place into the nose as needed.     potassium chloride SA (KLOR-CON) 20 MEQ tablet TAKE 2 TABLETS BY MOUTH DAILY. 180 tablet 0   rosuvastatin (CRESTOR) 20 MG tablet TAKE ONE TABLET BY MOUTH ONCE DAILY. 90 tablet 3   verapamil (VERELAN PM) 240 MG 24 hr capsule TAKE ONE CAPSULE BY MOUTH ONCE DAILY. 90 capsule 3   No current facility-administered medications for this visit.   Allergies:  Codeine   Social History: The patient  reports that she has never smoked. She has never used smokeless tobacco. She reports current alcohol use. She reports that she does not use drugs.   Family History: The patient's family history includes Colon polyps in her father; Heart attack in her paternal grandfather; Heart disease in  her paternal grandfather; Heart disease (age of onset: 31) in her father; Hyperlipidemia in her father and sister; Hypertension in her paternal grandmother.   ROS:  Please see the history of present illness. Otherwise, complete review of systems is positive for {NONE DEFAULTED:18576}.  All other systems are reviewed and negative.   Physical Exam: VS:  There were no vitals taken for this visit., BMI There is no height or weight on file to calculate BMI.  Wt Readings from Last 3 Encounters:  11/21/20 189 lb 9.6 oz (86 kg)  11/08/20 188 lb (85.3 kg)  11/05/20 188 lb (85.3 kg)    General: Patient appears comfortable at rest. HEENT: Conjunctiva and lids normal, oropharynx clear with moist mucosa. Neck: Supple, no elevated JVP or carotid bruits, no thyromegaly. Lungs:  Clear to auscultation, nonlabored breathing at rest. Cardiac: Regular rate and rhythm, no S3 or significant systolic murmur, no pericardial rub. Abdomen: Soft, nontender, no hepatomegaly, bowel sounds present, no guarding or rebound. Extremities: No pitting edema, distal pulses 2+. Skin: Warm and dry. Musculoskeletal: No kyphosis. Neuropsychiatric: Alert and oriented x3, affect grossly appropriate.  ECG:  An ECG dated 11/21/2020 was personally reviewed today and demonstrated:  Sinus rhythm.  Recent Labwork:    Component Value Date/Time   CHOL 178 03/19/2020 0914   TRIG 252 (H) 04/30/2020 1241   HDL 52 03/19/2020 0914   CHOLHDL 3.4 03/19/2020 0914   VLDL 59 (H) 03/19/2020 0914   LDLCALC 67 03/19/2020 0914    Other Studies Reviewed Today:  Echocardiogram 05/07/2020:  1. Left ventricular ejection fraction, by estimation, is 55 to 60%. The  left ventricle has normal function. The left ventricle has no regional  wall motion abnormalities. Left ventricular diastolic parameters are  consistent with Grade II diastolic  dysfunction (pseudonormalization).   2. Right ventricular systolic function is normal. The right ventricular  size is normal. There is normal pulmonary artery systolic pressure. The  estimated right ventricular systolic pressure is 53.6 mmHg.   3. The mitral valve is normal in structure. Trivial mitral valve  regurgitation. No evidence of mitral stenosis.   4. The aortic valve is tricuspid. Aortic valve regurgitation is mild. No  aortic stenosis is present.   5. The inferior vena cava is normal in size with greater than 50%  respiratory variability, suggesting right atrial pressure of 3 mmHg.   Assessment and Plan:    Medication Adjustments/Labs and Tests Ordered: Current medicines are reviewed at length with the patient today.  Concerns regarding medicines are outlined above.   Tests Ordered: No orders of the defined types were placed in this  encounter.   Medication Changes: No orders of the defined types were placed in this encounter.   Disposition:  Follow up {follow up:15908}  Signed, Satira Sark, MD, [facility] 05/30/2021 12:53 PM    [facility] [place], [place],  [address] Phone: [phone]; Fax: [phone]

## 2021-06-09 ENCOUNTER — Other Ambulatory Visit: Payer: Self-pay | Admitting: Cardiology

## 2021-07-29 ENCOUNTER — Ambulatory Visit (INDEPENDENT_AMBULATORY_CARE_PROVIDER_SITE_OTHER): Payer: BC Managed Care – PPO

## 2021-07-29 ENCOUNTER — Ambulatory Visit
Admission: EM | Admit: 2021-07-29 | Discharge: 2021-07-29 | Disposition: A | Discharge status: Home / Self Care | Payer: BC Managed Care – PPO | Attending: Student | Admitting: Student

## 2021-07-29 ENCOUNTER — Other Ambulatory Visit: Payer: Self-pay

## 2021-07-29 DIAGNOSIS — W19XXXA Unspecified fall, initial encounter: Secondary | ICD-10-CM

## 2021-07-29 DIAGNOSIS — S52602A Unspecified fracture of lower end of left ulna, initial encounter for closed fracture: Secondary | ICD-10-CM

## 2021-07-29 DIAGNOSIS — S52502A Unspecified fracture of the lower end of left radius, initial encounter for closed fracture: Secondary | ICD-10-CM

## 2021-07-29 DIAGNOSIS — M25532 Pain in left wrist: Secondary | ICD-10-CM

## 2021-07-29 NOTE — ED Provider Notes (Signed)
[facility]    CSN: [id number] Arrival date & time: 07/29/21  1545      History   Chief Complaint Chief Complaint  Patient presents with   Wrist Pain    Left wrist     HPI Debbie Allen is a 55 y.o. female presenting with left wrist pain following a slip and fall that occurred today.  Medical history arthritis, left ankle fracture, left wrist fracture.  She is right-handed.  States that she stepped on a book in her house and slipped, landing on the right knee and the left hand.  Initially was painful, but has gotten a lot worse over the last few hours.  She does have a unusual bony protrusion of the left radius at baseline, but states this is a lot more swollen.  Also with pain over the fifth metatarsal.  Initially with some tingling in the lateral distal fingers, this is improving.  Right knee pain over the patella, states this is minimally painful.  Has not attempted interventions at home.  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   Chest pain 06/15/2014   Aortic regurgitation 04/16/2014   Palpitations 10/19/2013   PSVT (paroxysmal supraventricular tachycardia) ([place]) 10/19/2013   Dyslipidemia  10/19/2013    Past Surgical History:  Procedure Laterality Date   COLONOSCOPY  07/2019   DILATION AND CURETTAGE OF UTERUS     LEFT HEART CATH     ORIF ANKLE FRACTURE Left 05/11/2020   Procedure: OPEN REDUCTION INTERNAL FIXATION (ORIF) ANKLE FRACTURE;  Surgeon: Nicholes Stairs, MD;  Location: [place];  Service: Orthopedics;  Laterality: Left;    OB History     Gravida  2   Para  2   Term  2   Preterm      AB      Living  2      SAB      IAB      Ectopic      Multiple      Live Births               Home Medications    Prior to Admission medications   Medication Sig Start Date End Date Taking? Authorizing Provider  albuterol (PROVENTIL HFA;VENTOLIN HFA) 108 (90 BASE) MCG/ACT inhaler Inhale 2 puffs into the lungs every 6 (six) hours as needed for wheezing or shortness of breath.    [provider]  aspirin EC 81 MG tablet Take 1 tablet (81 mg total) by mouth daily. 06/04/20   Thurnell Lose, MD  budesonide-formoterol [facility]) 160-4.5 MCG/ACT inhaler Inhale 2  puffs into the lungs daily.     [provider]  EPINEPHrine 0.3 mg/0.3 mL IJ SOAJ injection SMARTSIG:1 Pre-Filled Pen Syringe IM As Directed 09/17/20   [provider]  esomeprazole (NEXIUM) 20 MG capsule Take 20 mg by mouth daily at 12 noon.    [provider]  guaiFENesin (MUCINEX) 600 MG 12 hr tablet Take 600 mg by mouth 2 (two) times daily.    [provider]  ketotifen (ZADITOR) 0.025 % ophthalmic solution 1 drop into affected eye    [provider]  levocetirizine (XYZAL) 5 MG tablet Take 5 mg by mouth daily. 10/01/13   [provider]  meclizine (ANTIVERT) 12.5 MG tablet Take 25 mg by mouth 3 (three) times daily as needed for dizziness.    [provider]  metoprolol tartrate (LOPRESSOR) 25 MG tablet TAKE ONE TABLET BY MOUTH 2 TIMES A DAY. 05/15/21   Satira Sark, MD  montelukast (SINGULAIR) 10 MG tablet Take 10 mg by  mouth daily.  10/01/13   [provider]  Olopatadine HCl 0.6 % SOLN Place into the nose as needed.    [provider]  potassium chloride SA (KLOR-CON) 20 MEQ tablet TAKE 2 TABLETS BY MOUTH DAILY. 06/09/21   Verta Ellen., NP  rosuvastatin (CRESTOR) 20 MG tablet TAKE ONE TABLET BY MOUTH ONCE DAILY. 09/16/20   Satira Sark, MD  verapamil (VERELAN PM) 240 MG 24 hr capsule TAKE ONE CAPSULE BY MOUTH ONCE DAILY. 05/30/21   Satira Sark, MD    Family History Family History  Problem Relation Age of Onset   Heart disease Father 23   Hyperlipidemia Father    Colon polyps Father        benign   Hyperlipidemia Sister    Hypertension Paternal Grandmother    Heart disease Paternal Grandfather    Heart attack Paternal Grandfather    Colon cancer Neg Hx    Esophageal cancer Neg Hx    Rectal cancer Neg Hx    Stomach cancer Neg Hx     Social History Social History   Tobacco Use   Smoking status: Never   Smokeless tobacco: Never  Vaping Use   Vaping Use: Never used  Substance Use Topics   Alcohol use: Yes    Alcohol/week: 0.0 standard drinks    Comment: Occasional   Drug use: No     Allergies   Codeine   Review of Systems Review of Systems  Musculoskeletal:        L wrist pain  All other systems reviewed and are negative.   Physical Exam Triage Vital Signs ED Triage Vitals  Enc Vitals Group     BP 07/29/21 1756 (!) 147/89     Pulse Rate 07/29/21 1756 84     Resp 07/29/21 1756 16     Temp 07/29/21 1756 (!) 97.5 F (36.4 C)     Temp Source 07/29/21 1756 Temporal     SpO2 07/29/21 1756 95 %     Weight --      Height --      Head Circumference --      Peak Flow --      Pain Score 07/29/21 1755 5     Pain Loc --      Pain Edu? --      Excl. in [place]? --    No data found.  Updated Vital Signs BP (!) 147/89 (BP Location: Right Arm)   Pulse 84  Temp (!) 97.5 F (36.4 C) (Temporal)   Resp 16   SpO2 95%   Visual Acuity Right Eye  Distance:   Left Eye Distance:   Bilateral Distance:    Right Eye Near:   Left Eye Near:    Bilateral Near:     Physical Exam Vitals reviewed.  Constitutional:      General: She is not in acute distress.    Appearance: Normal appearance. She is not ill-appearing.  HENT:     Head: Normocephalic and atraumatic.  Pulmonary:     Effort: Pulmonary effort is normal.  Musculoskeletal:     Comments: R knee- no effusion or skin changes. Minimally tender over distal patella. Flexion and extension intact and with minimal pain. No crepitus or joint laxity. No calf tenderness.   L wrist- TTP distal radius and ulna. Effusion and bony abnormality palpated distal radius. ROM limited due to pain. 5th metarasal tenderness. Grip strength 2/5. Positive snuffbox tenderness. Cap refill <2 seconds throughout, sensation intact.   No R snuffbox tenderness.   Neurological:     General: No focal deficit present.     Mental Status: She is alert and oriented to person, place, and time.  Psychiatric:        Mood and Affect: Mood normal.        Behavior: Behavior normal.        Thought Content: Thought content normal.        Judgment: Judgment normal.     UC Treatments / Results  Labs (all labs ordered are listed, but only abnormal results are displayed) Labs Reviewed - No data to display  EKG   Radiology DG Wrist Complete Left  Result Date: 07/29/2021 CLINICAL DATA:  Fall with pain and effusion EXAM: LEFT WRIST - COMPLETE 3+ VIEW COMPARISON:  None. FINDINGS: Probable acute intra-articular distal radius fracture. No subluxation. Heterogeneous lucencies within the carpal bones potentially due to demineralization. Minimal deformity at the ulnar styloid which could be due to erosion versus Smartt fracture less likely. IMPRESSION: 1. Suspect acute intra-articular nondisplaced distal radius fracture 2. Erosion versus [place] fracture deformity at the ulnar styloid. Electronically Signed   By: Donavan Foil  M.D.   On: 07/29/2021 18:24    Procedures Procedures (including critical care time)  Medications Ordered in UC Medications - No data to display  Initial Impression / Assessment and Plan / UC Course  I have reviewed the triage vital signs and the nursing notes.  Pertinent labs & imaging results that were available during my care of the patient were reviewed by me and considered in my medical decision making (see chart for details).     This patient is a very pleasant 55 y.o. year old female presenting with ulnar and radial fractures. Neurovascularly intact. Distant history distal L radius fracture pr pt.   Xray L wrist -  1. Suspect acute intra-articular nondisplaced distal radius fracture 2. Erosion versus [place] fracture deformity at the ulnar styloid.  We are not equipped to perform splinting at this location. Given time of day, placed in wrist splint, which she will not remove until she follow-up with orthopedist tomorrow ASAP.  Given frequent fractures, suspect osteopenia. F/u with PCP for Dexa.  ED return precautions discussed. Patient verbalizes understanding and agreement.     Final Clinical Impressions(s) / UC Diagnoses   Final diagnoses:  Closed fracture of distal end of left radius, unspecified fracture morphology, initial encounter  Closed fracture of distal end of left ulna, unspecified  fracture morphology, initial encounter     Discharge Instructions      -You have two [place] fractures of the end of your wrist. Keep your wrist brace on until you see your orthopedist.  -You can take Tylenol up to 1000 mg 3 times daily, and ibuprofen up to 600 mg 3 times daily with food.  You can take these together, or alternate every 3-4 hours. Rest, ice.  -Follow-up with an orthopedist. I recommend [facility] at [address], [place], [place] [address]. You can schedule an appointment by calling [phone]) or online ([email or web address]), but they also have a walk-in  clinic M-F 8a-8p and Sat 10a-3p.      ED Prescriptions   None    PDMP not reviewed this encounter.   Hazel Sams, PA-C 07/29/21 [phone]

## 2021-07-29 NOTE — ED Triage Notes (Signed)
Patient presents to Urgent Care with complaints of left wrist pain from a slip and fall today. Pt states limited movement and pain has increased since injury. Treating pain with advil last dose 1600.

## 2021-07-29 NOTE — Discharge Instructions (Signed)
-  You have two [place] fractures of the end of your wrist. Keep your wrist brace on until you see your orthopedist.  -You can take Tylenol up to 1000 mg 3 times daily, and ibuprofen up to 600 mg 3 times daily with food.  You can take these together, or alternate every 3-4 hours. Rest, ice.  -Follow-up with an orthopedist. I recommend [facility] at [address], [place], [place] [address]. You can schedule an appointment by calling [phone]) or online ([email or web address]), but they also have a walk-in clinic M-F 8a-8p and Sat 10a-3p.

## 2021-08-04 ENCOUNTER — Encounter: Payer: Self-pay | Admitting: Cardiology

## 2021-08-04 ENCOUNTER — Ambulatory Visit: Payer: BC Managed Care – PPO | Admitting: Cardiology

## 2021-08-04 VITALS — BP 132/84 | HR 70 | Ht 62.0 in | Wt 180.4 lb

## 2021-08-04 DIAGNOSIS — E782 Mixed hyperlipidemia: Secondary | ICD-10-CM

## 2021-08-04 DIAGNOSIS — I471 Supraventricular tachycardia: Secondary | ICD-10-CM

## 2021-08-04 NOTE — Progress Notes (Signed)
Cardiology Office Note  Date: 08/04/2021   ID: Debbie Allen, DOB 01-Jul-1966, MRN [id number]  PCP:  Celene Squibb, MD  Cardiologist:  Rozann Lesches, MD Electrophysiologist:  None   Chief Complaint  Patient presents with   Cardiac follow-up    History of Present Illness: Debbie Allen is a 55 y.o. female last seen in March.  She is here for a routine visit.  Since last assessment she does not report any sense of recurring palpitations or chest pain.  Still working as a Pharmacist, hospital.  She has had some falls, most recently with a left wrist fracture currently in a brace.  I reviewed her cardiac regimen which is stable and outlined below.  She does not report any intolerances and will be having follow-up lab work with Dr. Nevada Crane next month.  Her last LDL looked good at 42.  She has had both COVID-19 and influenza A in the interim as well.  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 Debara Pickett but never documented)   Umbilical hernia     Past Surgical History:  Procedure Laterality Date   COLONOSCOPY  07/2019   DILATION AND CURETTAGE OF UTERUS     LEFT HEART CATH     ORIF ANKLE FRACTURE Left 05/11/2020   Procedure: OPEN REDUCTION INTERNAL FIXATION (ORIF) ANKLE FRACTURE;  Surgeon: Nicholes Stairs, MD;  Location: [facility];  Service: Orthopedics;  Laterality: Left;    Current Outpatient Medications  Medication Sig Dispense Refill   albuterol (PROVENTIL HFA;VENTOLIN HFA) 108 (90 BASE) MCG/ACT inhaler Inhale 2 puffs into the lungs  every 6 (six) hours as needed for wheezing or shortness of breath.     aspirin EC 81 MG tablet Take 1 tablet (81 mg total) by mouth daily. 90 tablet 3   budesonide-formoterol (SYMBICORT) 160-4.5 MCG/ACT inhaler Inhale 2 puffs into the lungs daily.      EPINEPHrine 0.3 mg/0.3 mL IJ SOAJ injection SMARTSIG:1 Pre-Filled Pen Syringe IM As Directed     esomeprazole (NEXIUM) 20 MG capsule Take 20 mg by mouth daily at 12 noon.     ketotifen (ZADITOR) 0.025 % ophthalmic solution 1 drop into affected eye     levocetirizine (XYZAL) 5 MG tablet Take 5 mg by mouth daily.     levothyroxine (SYNTHROID) 25 MCG tablet Take 25 mcg by mouth every morning.     meclizine (ANTIVERT) 12.5 MG tablet Take 25 mg by mouth 3 (three) times daily as needed for dizziness.     metoprolol tartrate (LOPRESSOR) 25 MG tablet TAKE ONE TABLET BY MOUTH 2 TIMES A DAY. 180 tablet 0   montelukast (SINGULAIR) 10 MG tablet Take 10 mg by mouth daily.      Olopatadine HCl 0.6 % SOLN Place into the nose as needed.     potassium chloride SA (KLOR-CON) 20 MEQ tablet TAKE 2 TABLETS BY MOUTH DAILY. 180 tablet 0   rosuvastatin (CRESTOR) 20 MG tablet TAKE  ONE TABLET BY MOUTH ONCE DAILY. 90 tablet 3   verapamil (VERELAN PM) 240 MG 24 hr capsule TAKE ONE CAPSULE BY MOUTH ONCE DAILY. 90 capsule 3   guaiFENesin (MUCINEX) 600 MG 12 hr tablet Take 600 mg by mouth 2 (two) times daily. (Patient not taking: Reported on 08/04/2021)     No current facility-administered medications for this visit.   Allergies:  Codeine   ROS: No palpitations or syncope.  Physical Exam: VS:  BP 132/84   Pulse 70   Ht 5\' 2"  (1.575 m)   Wt 180 lb 6.4 oz (81.8 kg)   SpO2 98%   BMI 33.00 kg/m , BMI Body mass index is 33 kg/m.  Wt Readings from Last 3 Encounters:  08/04/21 180 lb 6.4 oz (81.8 kg)  11/21/20 189 lb 9.6 oz (86 kg)  11/08/20 188 lb (85.3 kg)    General: Patient appears comfortable at rest. HEENT: Conjunctiva and lids normal. Neck: Supple, no  elevated JVP or carotid bruits, no thyromegaly. Lungs: Clear to auscultation, nonlabored breathing at rest. Cardiac: Regular rate and rhythm, no S3 or significant systolic murmur. Extremities: No pitting edema.  ECG:  An ECG dated 11/21/2020 was personally reviewed today and demonstrated:  Sinus rhythm.  Recent Labwork:    Component Value Date/Time   CHOL 178 03/19/2020 0914   TRIG 252 (H) 04/30/2020 1241   HDL 52 03/19/2020 0914   CHOLHDL 3.4 03/19/2020 0914   VLDL 59 (H) 03/19/2020 0914   LDLCALC 67 03/19/2020 0914    Other Studies Reviewed Today:  Echocardiogram 05/07/2020:  1. Left ventricular ejection fraction, by estimation, is 55 to 60%. The  left ventricle has normal function. The left ventricle has no regional  wall motion abnormalities. Left ventricular diastolic parameters are  consistent with Grade II diastolic  dysfunction (pseudonormalization).   2. Right ventricular systolic function is normal. The right ventricular  size is normal. There is normal pulmonary artery systolic pressure. The  estimated right ventricular systolic pressure is 56.9 mmHg.   3. The mitral valve is normal in structure. Trivial mitral valve  regurgitation. No evidence of mitral stenosis.   4. The aortic valve is tricuspid. Aortic valve regurgitation is mild. No  aortic stenosis is present.   5. The inferior vena cava is normal in size with greater than 50%  respiratory variability, suggesting right atrial pressure of 3 mmHg.   Assessment and Plan:  1.  History of PSVT.  She is doing well at this point without active palpitations on combination of Lopressor and verapamil.  Continue observation.  2.  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  No angina symptoms reported.  3.  Mixed hyperlipidemia, remains on Crestor with last LDL 67.  Medication Adjustments/Labs and Tests Ordered: Current medicines are reviewed at length with the patient  today.  Concerns regarding medicines are outlined above.   Tests Ordered: No orders of the defined types were placed in this encounter.   Medication Changes: No orders of the defined types were placed in this encounter.   Disposition:  Follow up  6 months.  Signed, Satira Sark, MD, [facility] 08/04/2021 4:43 PM    [facility] [place], [place], [place] [address] Phone: [phone]; Fax: [phone]

## 2021-08-04 NOTE — Patient Instructions (Signed)

## 2021-08-16 ENCOUNTER — Other Ambulatory Visit: Payer: Self-pay | Admitting: Cardiology

## 2021-08-21 IMAGING — DX DG ANKLE COMPLETE 3+V*L*
3 series · 3 of 3 positions shown · non-contrast
Comparison: Plain films of the left ankle earlier today.

CLINICAL DATA: The patient suffered left medial and lateral
malleolar fractures in a fall today. Post reduction imaging. Initial
encounter.

EXAM:
LEFT ANKLE COMPLETE - 3+ VIEW

[ankle ap]
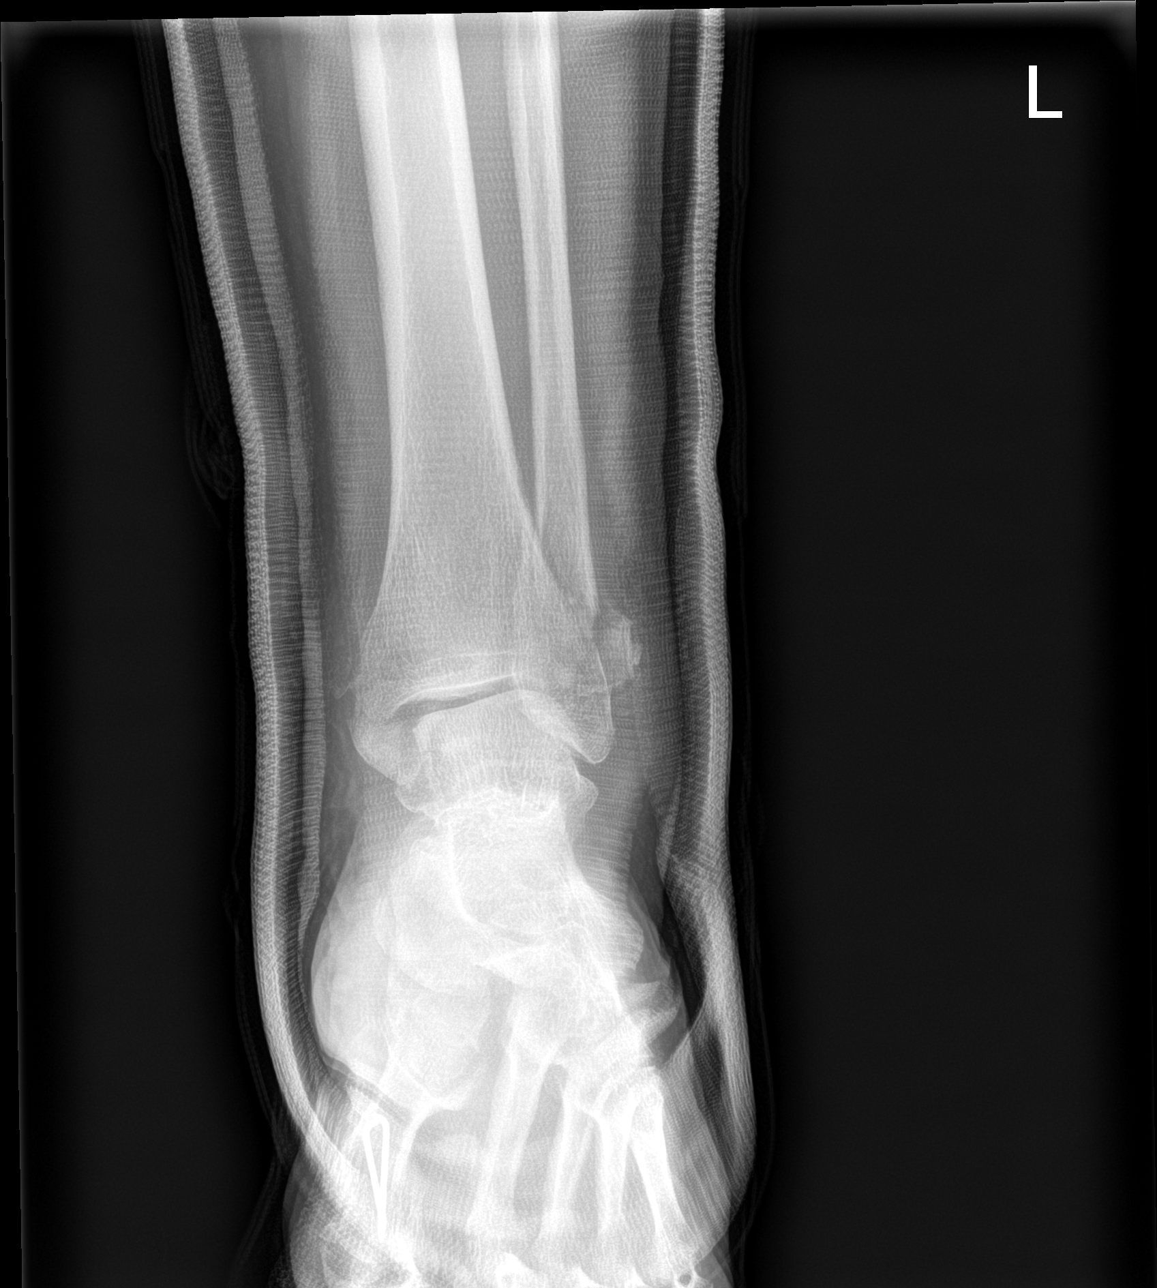

[ankle obl]
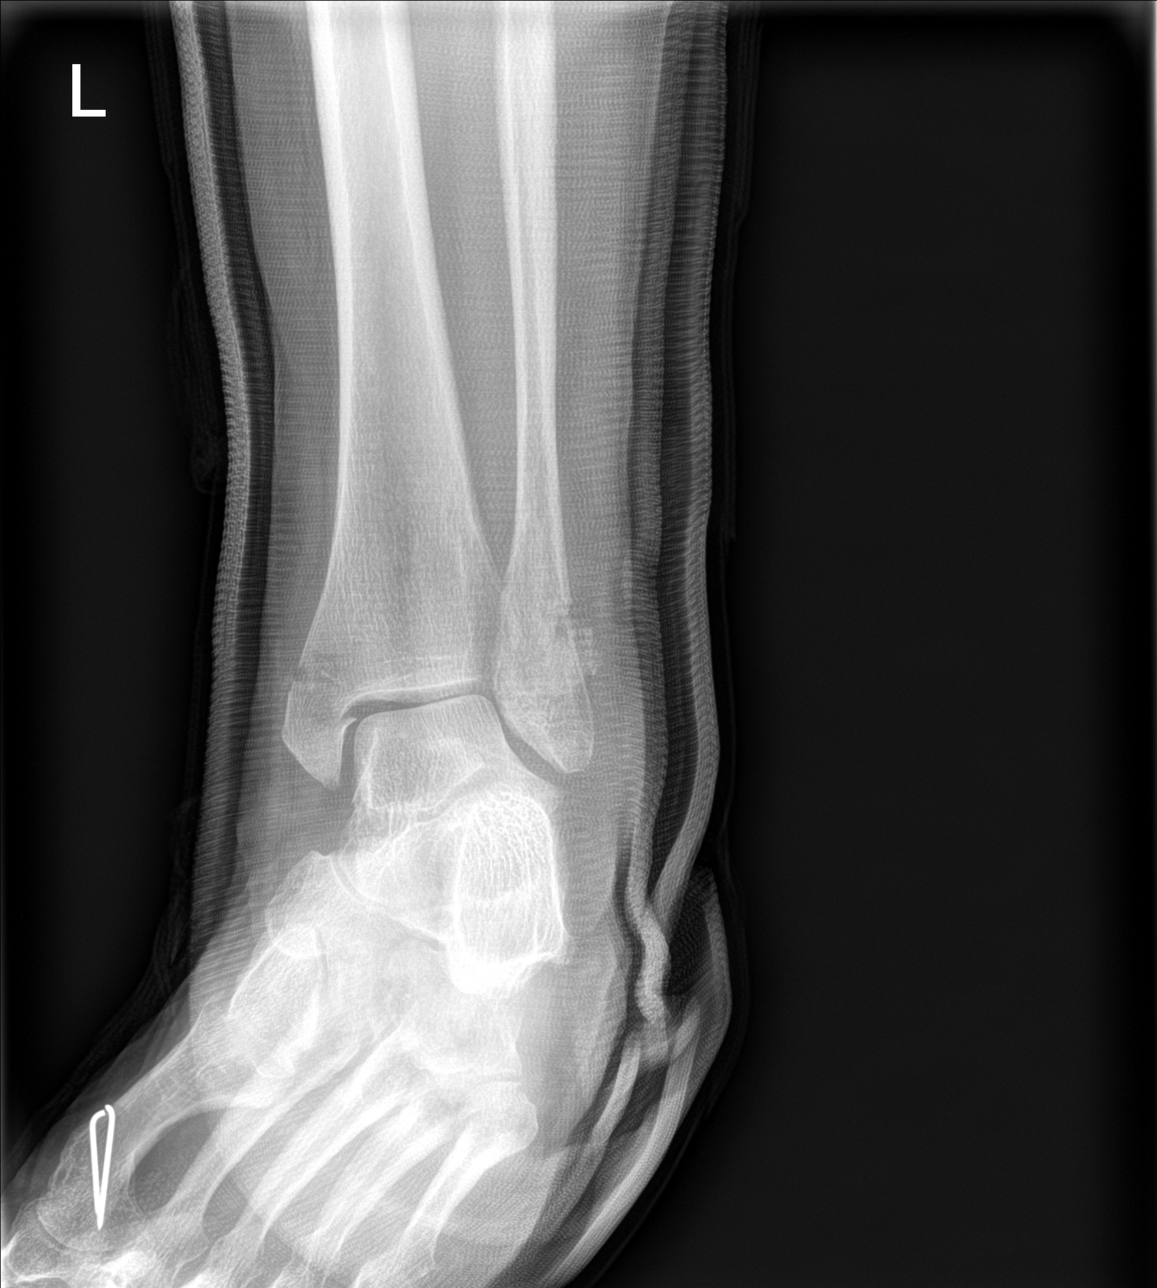

[ankle lat]
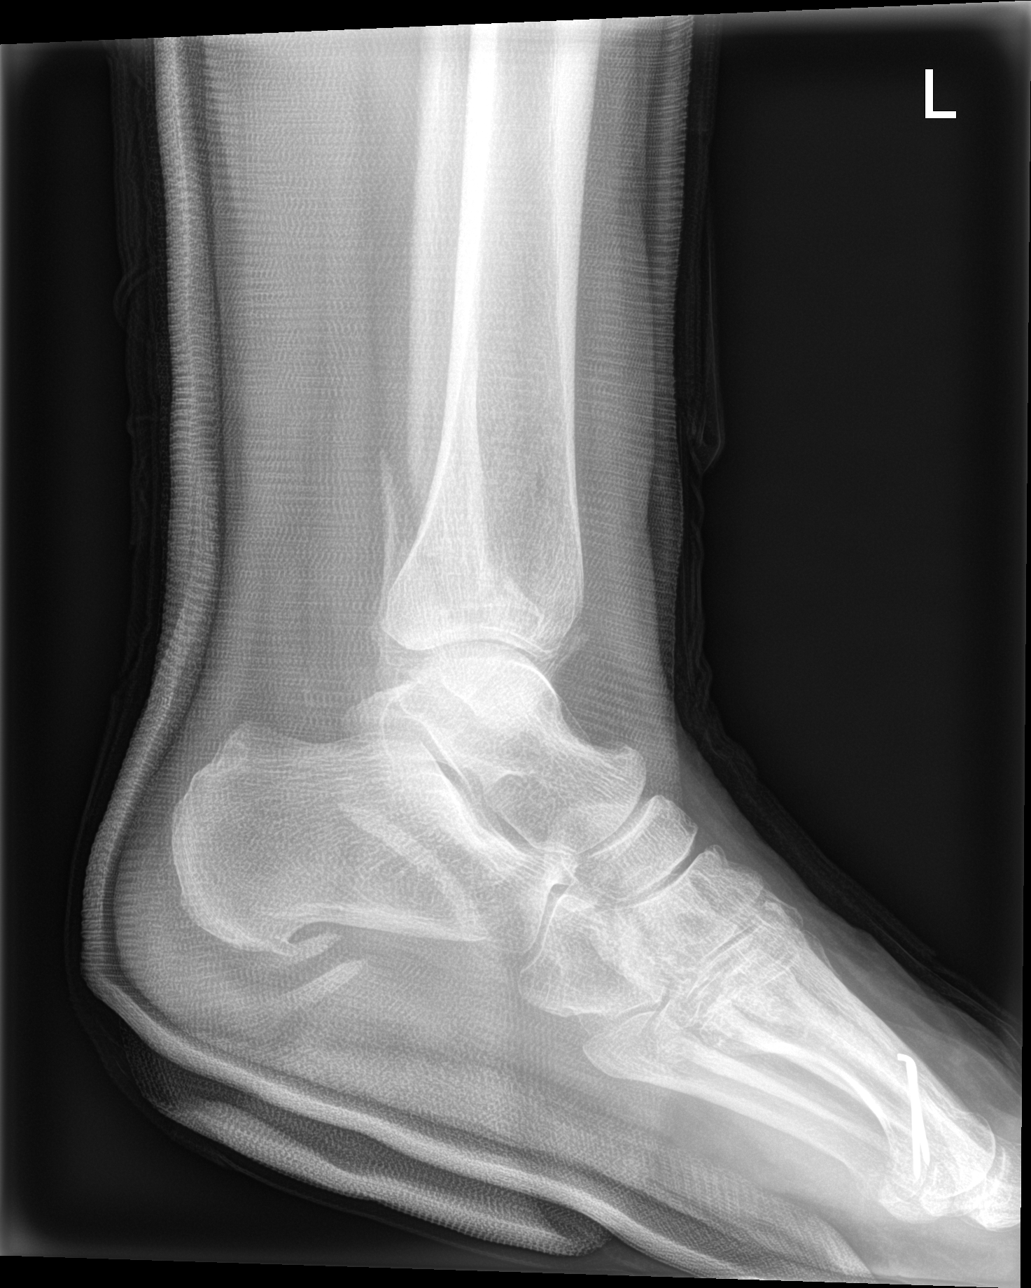

[3 of 3 positions shown; findings below may reference images not displayed]

FINDINGS: The patient is now in a fiberglass cast. Position and alignment of
the patient's medial and lateral malleolar fractures are markedly
improved. No new abnormality.
IMPRESSION: Marked improvement in position and alignment of medial and lateral
malleolar fractures. No new abnormality.

## 2021-08-21 IMAGING — DX DG ANKLE COMPLETE 3+V*L*
3 series · 3 of 3 positions shown · non-contrast
Comparison: None.

CLINICAL DATA: The patient suffered a left ankle injury in a fall
today. Initial encounter.

EXAM:
LEFT ANKLE COMPLETE - 3+ VIEW

[ankle ap]
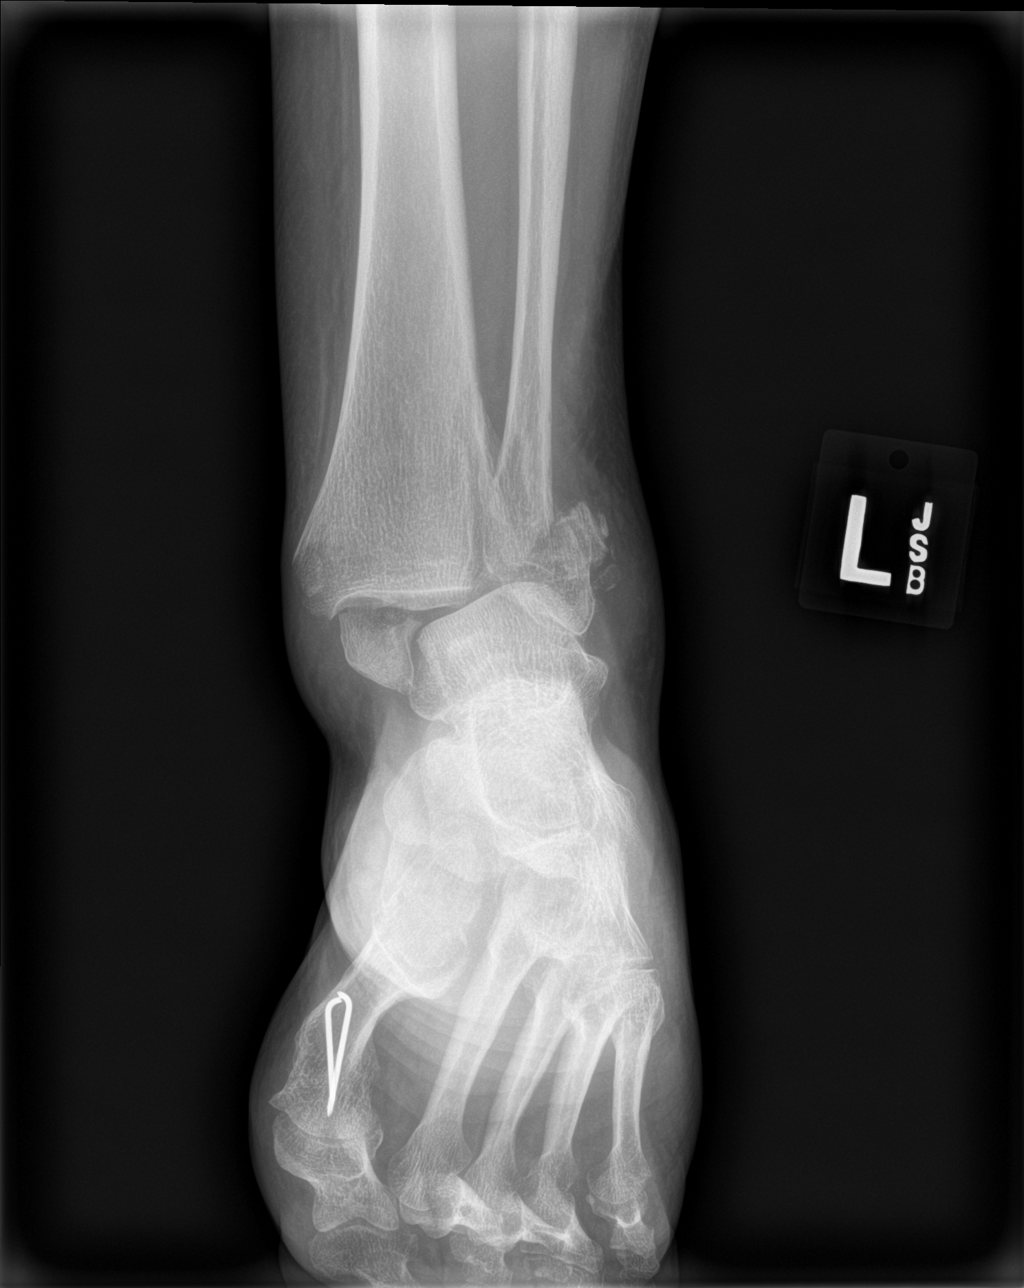

[ankle obl]
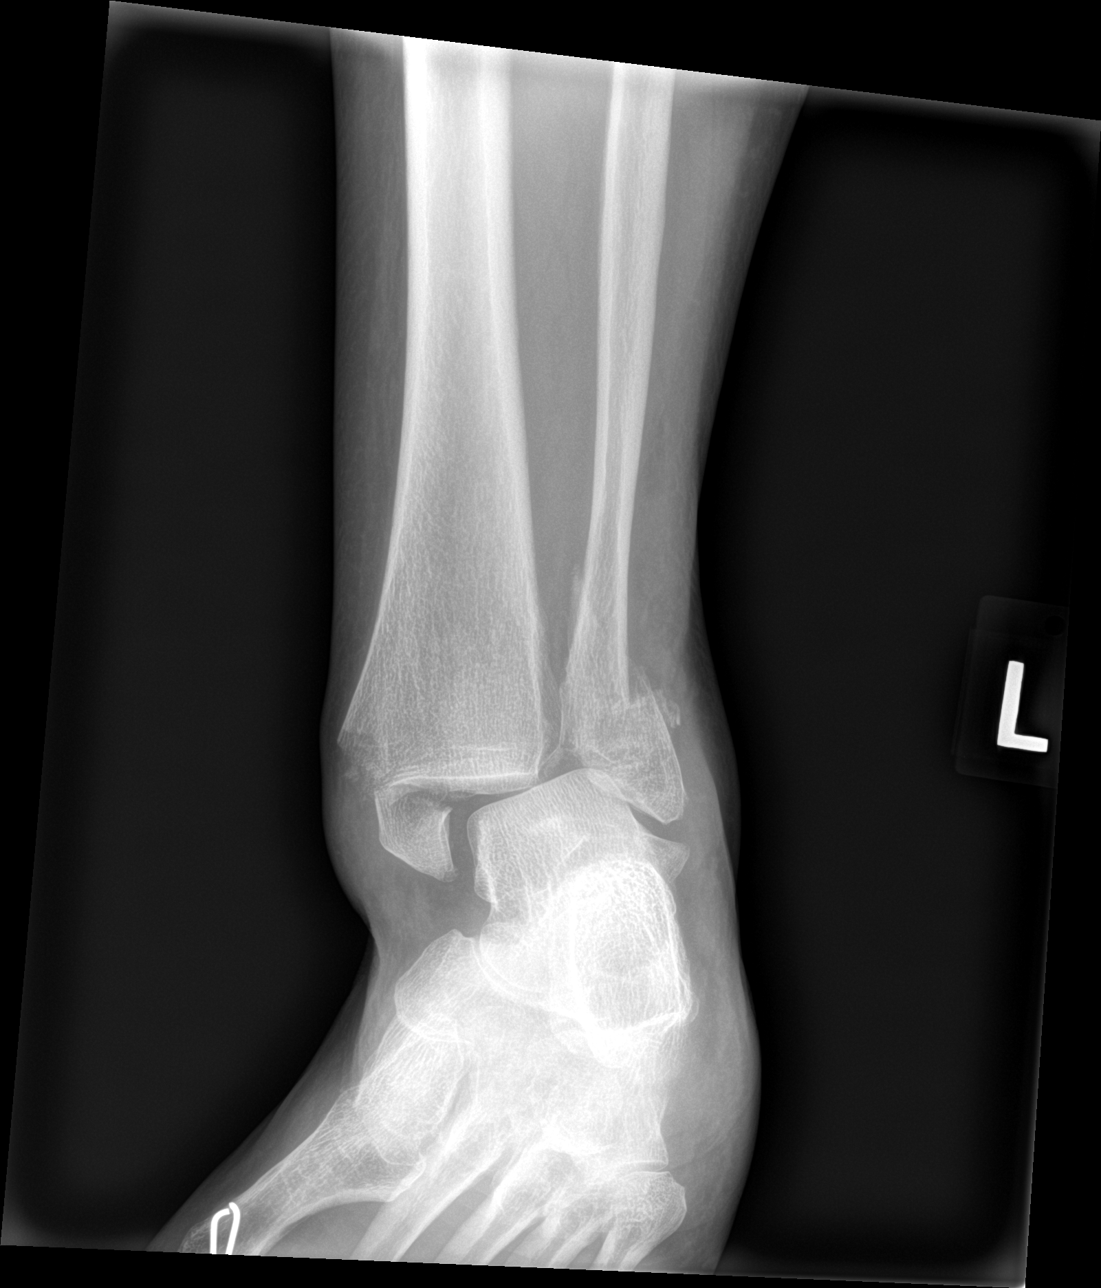

[ankle lat]
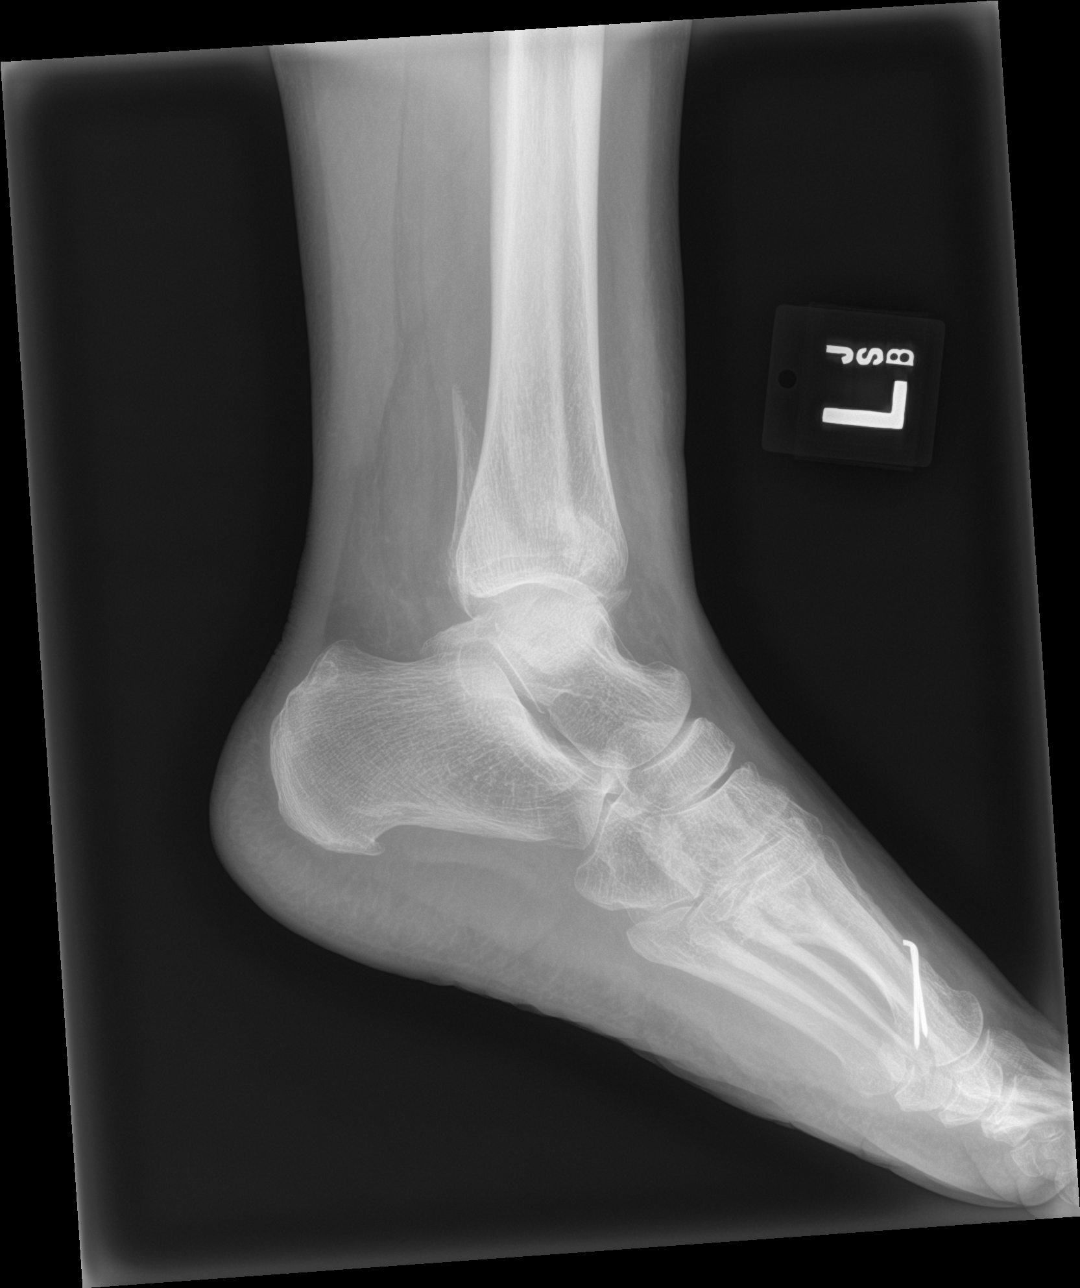

[3 of 3 positions shown; findings below may reference images not displayed]

FINDINGS: The patient has acute medial and lateral malleolar fractures. Both
fractures are laterally displaced approximately 1 shaft width. The
talus is laterally subluxed 1.1 cm. No other acute bony or joint
abnormality is identified. There is soft tissue swelling about the
patient's fractures. Pins for hallux valgus repair in the distal
first metatarsal noted.
IMPRESSION: Acute, displaced medial and lateral malleolar fractures as
described.

## 2021-08-24 IMAGING — DX DG CHEST 1V PORT
1 series · 1 of 1 positions shown · non-contrast
Comparison: Chest x-ray dated May 15, 2016.

CLINICAL DATA: Cough and shortness of breath. Recent COVID
diagnosis.

EXAM:
PORTABLE CHEST 1 VIEW

[chest ap]
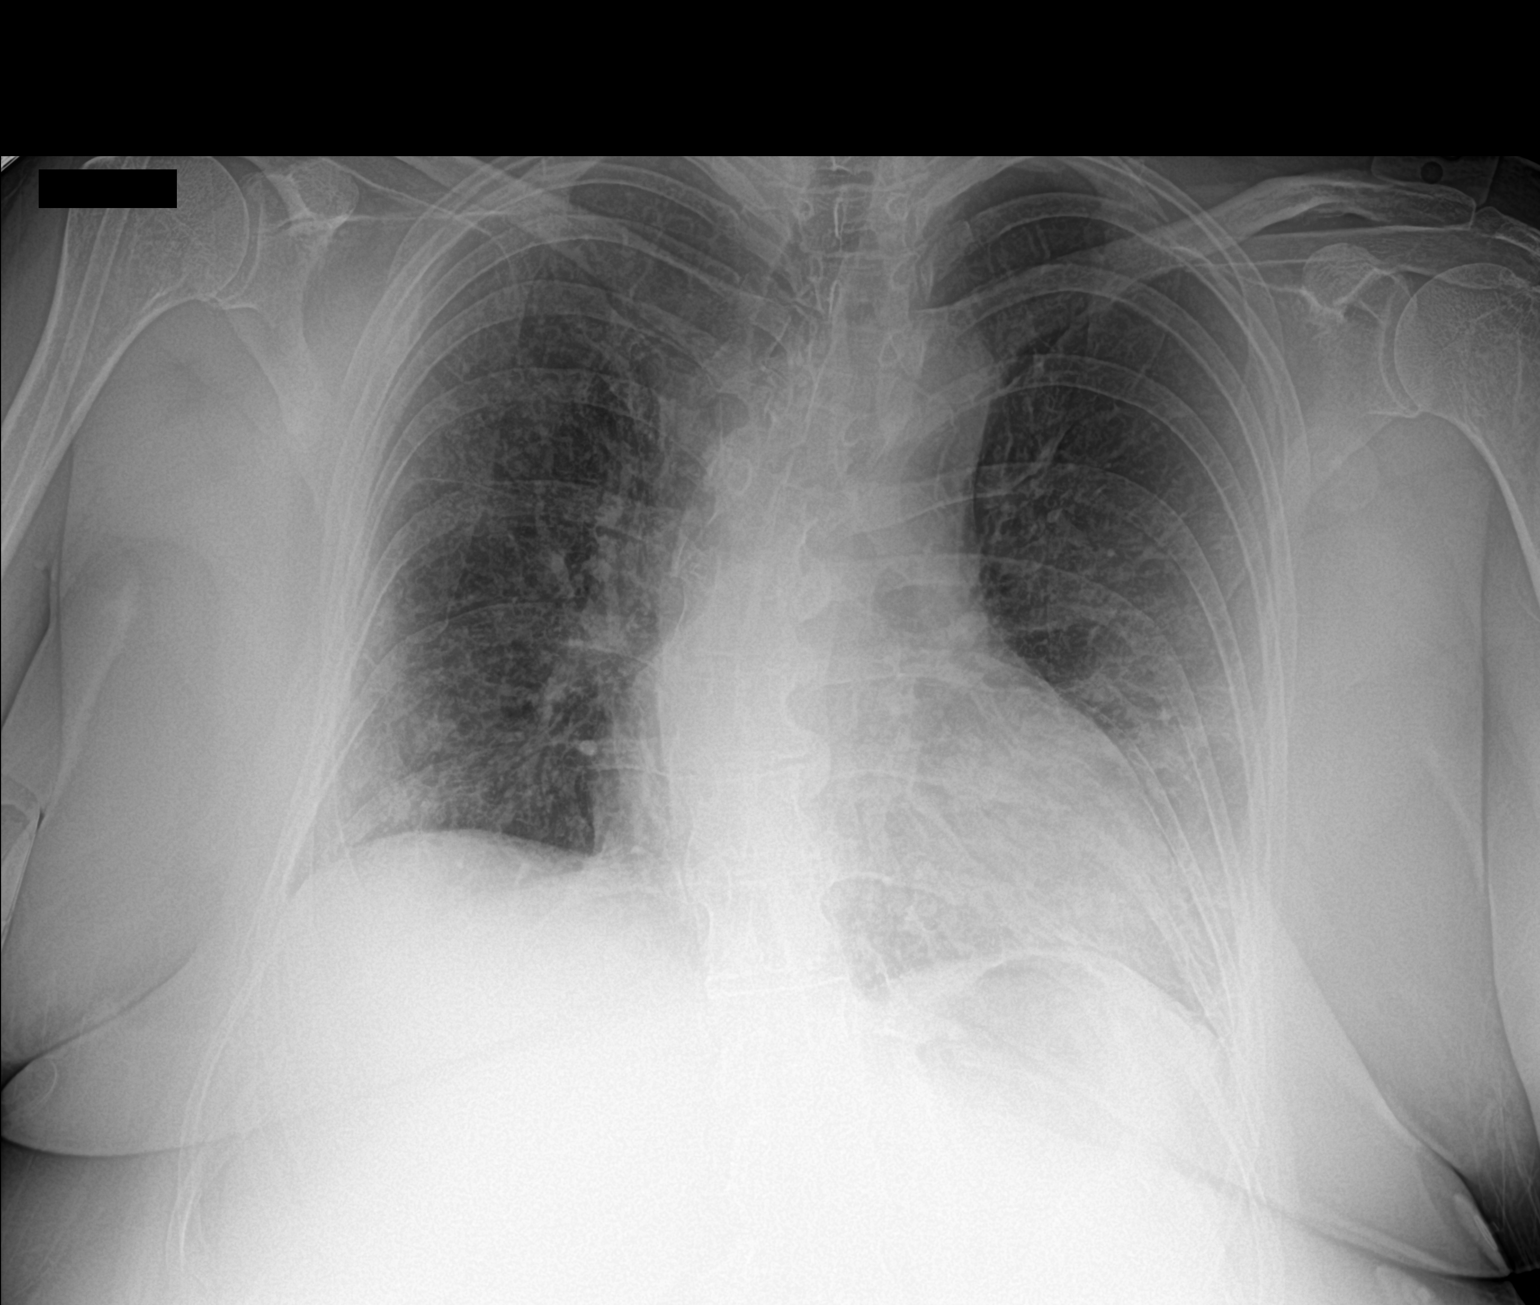

[1 of 1 positions shown; findings below may reference images not displayed]

FINDINGS: The heart size and mediastinal contours are within normal limits.
Streaky linear and reticulonodular interstitial thickening and
opacity at both lung bases. No focal consolidation, pleural
effusion, or pneumothorax. No acute osseous abnormality.
IMPRESSION: 1. Bilateral lower lobe interstitial and airspace disease,
consistent with atypical infection/viral pneumonia.

## 2021-08-25 NOTE — Progress Notes (Signed)
Surgical Instructions    Your procedure is scheduled on 08/29/21.  Report to [facility] Main Entrance "A" at 1:00 P.M., then check in with the Admitting office.  Call this number if you have problems the morning of surgery:  [phone]   If you have any questions prior to your surgery date call [phone]: Open Monday-Friday 8am-4pm    Remember:  Do not eat after midnight the night before your surgery  You may drink clear liquids until 12:00 PM the morning of your surgery.   Clear liquids allowed are: Water, Non-Citrus Juices (without pulp), Carbonated Beverages, Clear Tea, Black Coffee ONLY (NO MILK, CREAM OR POWDERED CREAMER of any kind), and Gatorade    Take these medicines the morning of surgery with A SIP OF WATER  esomeprazole (NEXIUM)  levothyroxine (SYNTHROID)  metoprolol tartrate (LOPRESSOR) Olopatadine nasal spray rosuvastatin (CRESTOR)  potassium chloride SA (KLOR-CON)   IF NEEDED: albuterol (PROVENTIL HFA;VENTOLIN HFA)- bring inhaler with you the day of surgery budesonide-formoterol (SYMBICORT) - bring with you the day of surgery EPINEPHrine injection meclizine (ANTIVERT)   As of today, STOP taking any Aspirin (unless otherwise instructed by your surgeon) Aleve, Naproxen, Ibuprofen, Motrin, Advil, Goody's, BC's, all herbal medications, fish oil, and all vitamins.     After your COVID test   You are not required to quarantine however you are required to wear a well-fitting mask when you are out and around people not in your household.  If your mask becomes wet or soiled, replace with a new one.  Wash your hands often with soap and water for 20 seconds or clean your hands with an alcohol-based hand sanitizer that contains at least 60% alcohol.  Do not share personal items.  Notify your provider: if you are in close contact with someone who has COVID  or if you develop a fever of 100.4 or greater, sneezing, cough, sore throat, shortness of breath or body  aches.             Do not wear jewelry or makeup Do not wear lotions, powders, perfumes/colognes, or deodorant. Do not shave 48 hours prior to surgery.   Do not bring valuables to the hospital. DO Not wear nail polish, gel polish, artificial nails, or any other type of covering on natural nails including finger and toenails. If patients have artificial nails, gel coating, etc. that need to be removed by a nail salon, please have this removed prior to surgery or surgery may need to be canceled/delayed if the surgeon/ anesthesia feels like the patient is unable to be adequately monitored.             [facility] is not responsible for any belongings or valuables.  Do NOT Smoke (Tobacco/Vaping)  24 hours prior to your procedure  If you use a CPAP at night, you may bring your mask for your overnight stay.   Contacts, glasses, hearing aids, dentures or partials may not be worn into surgery, please bring cases for these belongings   For patients admitted to the hospital, discharge time will be determined by your treatment team.   Patients discharged the day of surgery will not be allowed to drive home, and someone needs to stay with them for 24 hours.  NO VISITORS WILL BE ALLOWED IN PRE-OP WHERE PATIENTS ARE PREPPED FOR SURGERY.  ONLY 1 SUPPORT PERSON MAY BE PRESENT IN THE WAITING ROOM WHILE YOU ARE IN SURGERY.  IF YOU ARE TO BE ADMITTED, ONCE YOU ARE IN YOUR ROOM  YOU WILL BE ALLOWED TWO (2) VISITORS. 1 (ONE) VISITOR MAY STAY OVERNIGHT BUT MUST ARRIVE TO THE ROOM BY 8pm.  Minor children may have two parents present. Special consideration for safety and communication needs will be reviewed on a case by case basis.  Special instructions:    Oral Hygiene is also important to reduce your risk of infection.  Remember - BRUSH YOUR TEETH THE MORNING OF SURGERY WITH YOUR REGULAR TOOTHPASTE   - Preparing For Surgery  Before surgery, you can play an important role. Because skin is not  sterile, your skin needs to be as free of germs as possible. You can reduce the number of germs on your skin by washing with CHG (chlorahexidine gluconate) Soap before surgery.  CHG is an antiseptic cleaner which kills germs and bonds with the skin to continue killing germs even after washing.     Please do not use if you have an allergy to CHG or antibacterial soaps. If your skin becomes reddened/irritated stop using the CHG.  Do not shave (including legs and underarms) for at least 48 hours prior to first CHG shower. It is OK to shave your face.  Please follow these instructions carefully.     Shower the NIGHT BEFORE SURGERY and the MORNING OF SURGERY with CHG Soap.   If you chose to wash your hair, wash your hair first as usual with your normal shampoo. After you shampoo, rinse your hair and body thoroughly to remove the shampoo.  Then [employer] and genitals (private parts) with your normal soap and rinse thoroughly to remove soap.  After that Use CHG Soap as you would any other liquid soap. You can apply CHG directly to the skin and wash gently with a scrungie or a clean washcloth.   Apply the CHG Soap to your body ONLY FROM THE NECK DOWN.  Do not use on open wounds or open sores. Avoid contact with your eyes, ears, mouth and genitals (private parts). Wash Face and genitals (private parts)  with your normal soap.   Wash thoroughly, paying special attention to the area where your surgery will be performed.  Thoroughly rinse your body with warm water from the neck down.  DO NOT shower/wash with your normal soap after using and rinsing off the CHG Soap.  Pat yourself dry with a CLEAN TOWEL.  Wear CLEAN PAJAMAS to bed the night before surgery  Place CLEAN SHEETS on your bed the night before your surgery  DO NOT SLEEP WITH PETS.   Day of Surgery: Take a shower with CHG soap. Wear Clean/Comfortable clothing the morning of surgery Do not apply any deodorants/lotions.   Remember to  brush your teeth WITH YOUR REGULAR TOOTHPASTE.   Please read over the following fact sheets that you were given.

## 2021-08-26 ENCOUNTER — Encounter (HOSPITAL_COMMUNITY): Payer: Self-pay

## 2021-08-26 ENCOUNTER — Other Ambulatory Visit: Payer: Self-pay

## 2021-08-26 ENCOUNTER — Encounter (HOSPITAL_COMMUNITY)
Admission: RE | Admit: 2021-08-26 | Discharge: 2021-08-26 | Disposition: A | Discharge status: Home / Self Care | Payer: BC Managed Care – PPO | Source: Ambulatory Visit | Attending: Orthopedic Surgery | Admitting: Orthopedic Surgery

## 2021-08-26 VITALS — BP 132/80 | HR 70 | Temp 98.3°F | Resp 18 | Ht 63.0 in | Wt 180.9 lb

## 2021-08-26 DIAGNOSIS — X58XXXA Exposure to other specified factors, initial encounter: Secondary | ICD-10-CM | POA: Diagnosis not present

## 2021-08-26 DIAGNOSIS — J45909 Unspecified asthma, uncomplicated: Secondary | ICD-10-CM | POA: Diagnosis not present

## 2021-08-26 DIAGNOSIS — Z01812 Encounter for preprocedural laboratory examination: Secondary | ICD-10-CM | POA: Insufficient documentation

## 2021-08-26 DIAGNOSIS — Y831 Surgical operation with implant of artificial internal device as the cause of abnormal reaction of the patient, or of later complication, without mention of misadventure at the time of the procedure: Secondary | ICD-10-CM | POA: Diagnosis not present

## 2021-08-26 DIAGNOSIS — Z01818 Encounter for other preprocedural examination: Secondary | ICD-10-CM

## 2021-08-26 DIAGNOSIS — Z7951 Long term (current) use of inhaled steroids: Secondary | ICD-10-CM | POA: Diagnosis not present

## 2021-08-26 DIAGNOSIS — K219 Gastro-esophageal reflux disease without esophagitis: Secondary | ICD-10-CM | POA: Diagnosis not present

## 2021-08-26 DIAGNOSIS — T8484XA Pain due to internal orthopedic prosthetic devices, implants and grafts, initial encounter: Secondary | ICD-10-CM | POA: Diagnosis present

## 2021-08-26 LAB — SURGICAL PCR SCREEN
MRSA, PCR: NEGATIVE
Staphylococcus aureus: NEGATIVE

## 2021-08-26 LAB — CBC
HCT: 42 % (ref 36.0–46.0)
Hemoglobin: 13.2 g/dL (ref 12.0–15.0)
MCH: 27.7 pg (ref 26.0–34.0)
MCHC: 31.4 g/dL (ref 30.0–36.0)
MCV: 88.2 fL (ref 80.0–100.0)
Platelets: 274 10*3/uL (ref 150–400)
RBC: 4.76 MIL/uL (ref 3.87–5.11)
RDW: 13.6 % (ref 11.5–15.5)
WBC: 7 10*3/uL (ref 4.0–10.5)
nRBC: 0 % (ref 0.0–0.2)

## 2021-08-26 LAB — BASIC METABOLIC PANEL
Anion gap: 9 (ref 5–15)
BUN: 12 mg/dL (ref 6–20)
CO2: 23 mmol/L (ref 22–32)
Calcium: 9 mg/dL (ref 8.9–10.3)
Chloride: 108 mmol/L (ref 98–111)
Creatinine, Ser: 0.94 mg/dL (ref 0.44–1.00)
GFR, Estimated: >60 mL/min (ref >60)
Glucose, Bld: 91 mg/dL (ref 70–99)
Potassium: 4.1 mmol/L (ref 3.5–5.1)
Sodium: 140 mmol/L (ref 135–145)

## 2021-08-26 NOTE — Progress Notes (Addendum)
Surgical Instructions    Your procedure is scheduled on Friday, December 23rd.  Report to [facility] Main Entrance "A" at 1:00 P.M., then check in with the Admitting office.  Call this number if you have problems the morning of surgery:  [phone]   If you have any questions prior to your surgery date call [phone]: Open Monday-Friday 8am-4pm    Remember:  Do not eat after midnight the night before your surgery  You may drink clear liquids until 12:00 PM the morning of your surgery.   Clear liquids allowed are: Water, Non-Citrus Juices (without pulp), Carbonated Beverages, Clear Tea, Black Coffee ONLY (NO MILK, CREAM OR POWDERED CREAMER of any kind), and Gatorade  Please complete your PRE-SURGERY ENSURE that was provided to you by 12:00 PM the afternoon of surgery.  Please, if able, drink it in one setting. DO NOT SIP. Nothing else to drink after you finish the Ensure.      Take these medicines the morning of surgery with A SIP OF WATER  esomeprazole (NEXIUM)  levothyroxine (SYNTHROID)  metoprolol tartrate (LOPRESSOR) Olopatadine nasal spray rosuvastatin (CRESTOR)  potassium chloride SA (KLOR-CON)   IF NEEDED: albuterol (PROVENTIL HFA;VENTOLIN HFA)- bring inhaler with you the day of surgery budesonide-formoterol (SYMBICORT) - bring with you the day of surgery EPINEPHrine injection meclizine (ANTIVERT)   As of today, STOP taking any Aspirin (unless otherwise instructed by your surgeon) Aleve, Naproxen, Ibuprofen, Motrin, Advil, Goody's, BC's, all herbal medications, fish oil, and all vitamins.  DAY OF SURGERY:         Do not wear jewelry, makeup, or nail polish Do not wear lotions, powders, perfumes, or deodorant. Do not shave 48 hours prior to surgery.   Do not bring valuables to the hospital.             [facility] is not responsible for any belongings or valuables.  Do NOT Smoke (Tobacco/Vaping)  24 hours prior to your procedure  If you use a CPAP at night, you  may bring your mask for your overnight stay.   Contacts, glasses, hearing aids, dentures or partials may not be worn into surgery, please bring cases for these belongings   For patients admitted to the hospital, discharge time will be determined by your treatment team.   Patients discharged the day of surgery will not be allowed to drive home, and someone needs to stay with them for 24 hours.  NO VISITORS WILL BE ALLOWED IN PRE-OP WHERE PATIENTS ARE PREPPED FOR SURGERY.  ONLY 1 SUPPORT PERSON MAY BE PRESENT IN THE WAITING ROOM WHILE YOU ARE IN SURGERY.  IF YOU ARE TO BE ADMITTED, ONCE YOU ARE IN YOUR ROOM YOU WILL BE ALLOWED TWO (2) VISITORS. 1 (ONE) VISITOR MAY STAY OVERNIGHT BUT MUST ARRIVE TO THE ROOM BY 8pm.  Minor children may have two parents present. Special consideration for safety and communication needs will be reviewed on a case by case basis.  Special instructions:    Oral Hygiene is also important to reduce your risk of infection.  Remember - BRUSH YOUR TEETH THE MORNING OF SURGERY WITH YOUR REGULAR TOOTHPASTE   [facility]- Preparing For Surgery  Before surgery, you can play an important role. Because skin is not sterile, your skin needs to be as free of germs as possible. You can reduce the number of germs on your skin by washing with CHG (chlorahexidine gluconate) Soap before surgery.  CHG is an antiseptic cleaner which kills germs and bonds with the  skin to continue killing germs even after washing.     Please do not use if you have an allergy to CHG or antibacterial soaps. If your skin becomes reddened/irritated stop using the CHG.  Do not shave (including legs and underarms) for at least 48 hours prior to first CHG shower. It is OK to shave your face.  Please follow these instructions carefully.     Shower the NIGHT BEFORE SURGERY and the MORNING OF SURGERY with CHG Soap.   If you chose to wash your hair, wash your hair first as usual with your normal shampoo. After you  shampoo, rinse your hair and body thoroughly to remove the shampoo.  Then [employer] and genitals (private parts) with your normal soap and rinse thoroughly to remove soap.  After that Use CHG Soap as you would any other liquid soap. You can apply CHG directly to the skin and wash gently with a scrungie or a clean washcloth.   Apply the CHG Soap to your body ONLY FROM THE NECK DOWN.  Do not use on open wounds or open sores. Avoid contact with your eyes, ears, mouth and genitals (private parts). Wash Face and genitals (private parts)  with your normal soap.   Wash thoroughly, paying special attention to the area where your surgery will be performed.  Thoroughly rinse your body with warm water from the neck down.  DO NOT shower/wash with your normal soap after using and rinsing off the CHG Soap.  Pat yourself dry with a CLEAN TOWEL.  Wear CLEAN PAJAMAS to bed the night before surgery  Place CLEAN SHEETS on your bed the night before your surgery  DO NOT SLEEP WITH PETS.   Day of Surgery: Take a shower with CHG soap. Wear Clean/Comfortable clothing the morning of surgery Do not apply any deodorants/lotions.   Remember to brush your teeth WITH YOUR REGULAR TOOTHPASTE.   Please read over the following fact sheets that you were given.

## 2021-08-26 NOTE — Progress Notes (Addendum)
PCP - Allyn Kenner Cardiologist - [place] Last office visit 08/04/21  Chest x-ray - n/a EKG - 11/21/20 Stress Test - 06/21/14 ECHO - 05/07/20 Cardiac Cath - 2008   ERAS Protcol - yes, Ensure given  COVID TEST- patient was positive for Covid on July 15, 2021, no need to retest Received verbal confirmation from Dr. Juel Burrow office. Office is supposed to fax over proof of positive test result.    Anesthesia review: yes, heart history   Patient denies shortness of breath, fever, cough and chest pain at PAT appointment   All instructions explained to the patient, with a verbal understanding of the material. Patient agrees to go over the instructions while at home for a better understanding. Patient also instructed to self quarantine after being tested for COVID-19. The opportunity to ask questions was provided.

## 2021-08-28 NOTE — Anesthesia Preprocedure Evaluation (Addendum)
Anesthesia Evaluation  Patient identified by MRN, date of birth, ID band Patient awake    Reviewed: Allergy & Precautions, NPO status , Patient's Chart, lab work & pertinent test results  Airway Mallampati: II  TM Distance: >3 FB Neck ROM: Full    Dental no notable dental hx. (+) Teeth Intact, Dental Advisory Given   Pulmonary asthma ,    Pulmonary exam normal breath sounds clear to auscultation       Cardiovascular Normal cardiovascular exam+ dysrhythmias Supra Ventricular Tachycardia + Valvular Problems/Murmurs AI  Rhythm:Regular Rate:Normal  04/2020 TTE 1. Left ventricular ejection fraction, by estimation, is 55 to 60%. The  left ventricle has normal function. The left ventricle has no regional  wall motion abnormalities. Left ventricular diastolic parameters are  consistent with Grade II diastolic  dysfunction (pseudonormalization).  2. Right ventricular systolic function is normal. The right ventricular  size is normal. There is normal pulmonary artery systolic pressure. The  estimated right ventricular systolic pressure is 70.3 mmHg.  3. The mitral valve is normal in structure. Trivial mitral valve  regurgitation. No evidence of mitral stenosis.  4. The aortic valve is tricuspid. Aortic valve regurgitation is mild. No  aortic stenosis is present.  5. The inferior vena cava is normal in size with greater than 50%  respiratory variability, suggesting right atrial pressure of 3 mmHg.    Neuro/Psych  Neuromuscular disease negative psych ROS   GI/Hepatic Neg liver ROS, GERD  ,  Endo/Other    Renal/GU Lab Results      Component                Value               Date                      CREATININE               0.94                08/26/2021                       K                        4.1                 08/26/2021                        Musculoskeletal  (+) Arthritis ,   Abdominal (+) + obese (BMI 32.04),    Peds  Hematology Lab Results      Component                Value               Date                      WBC                      7.0                 08/26/2021                HGB                      13.2  08/26/2021                HCT                      42.0                08/26/2021                MCV                      88.2                08/26/2021                PLT                      274                 08/26/2021              Anesthesia Other Findings ALL: Codeine  Reproductive/Obstetrics                            Anesthesia Physical Anesthesia Plan  ASA: 2  Anesthesia Plan: Regional   Post-op Pain Management: Tylenol PO (pre-op) and Minimal or no pain anticipated   Induction:   PONV Risk Score and Plan: 2 and Treatment may vary due to age or medical condition, Ondansetron, Midazolam and Dexamethasone  Airway Management Planned: Natural Airway and Simple Face Mask  Additional Equipment: None  Intra-op Plan:   Post-operative Plan:   Informed Consent:     Dental advisory given  Plan Discussed with: CRNA and Anesthesiologist  Anesthesia Plan Comments: (Pt w hx of neuropathy will investigate further  Mac w block)       Anesthesia Quick Evaluation

## 2021-08-29 ENCOUNTER — Encounter (HOSPITAL_COMMUNITY): Payer: Self-pay | Admitting: Orthopedic Surgery

## 2021-08-29 ENCOUNTER — Ambulatory Visit (HOSPITAL_COMMUNITY)
Admission: RE | Admit: 2021-08-29 | Discharge: 2021-08-29 | Disposition: A | Discharge status: Home / Self Care | Payer: BC Managed Care – PPO | Attending: Orthopedic Surgery | Admitting: Orthopedic Surgery

## 2021-08-29 ENCOUNTER — Ambulatory Visit (HOSPITAL_COMMUNITY): Payer: BC Managed Care – PPO

## 2021-08-29 ENCOUNTER — Ambulatory Visit (HOSPITAL_COMMUNITY): Payer: BC Managed Care – PPO | Admitting: Physician Assistant

## 2021-08-29 ENCOUNTER — Encounter (HOSPITAL_COMMUNITY)
Admission: RE | Disposition: A | Discharge status: Home / Self Care | Payer: Self-pay | Source: Home / Self Care | Attending: Orthopedic Surgery

## 2021-08-29 ENCOUNTER — Ambulatory Visit (HOSPITAL_COMMUNITY): Payer: BC Managed Care – PPO | Admitting: Anesthesiology

## 2021-08-29 DIAGNOSIS — K219 Gastro-esophageal reflux disease without esophagitis: Secondary | ICD-10-CM | POA: Diagnosis not present

## 2021-08-29 DIAGNOSIS — Y831 Surgical operation with implant of artificial internal device as the cause of abnormal reaction of the patient, or of later complication, without mention of misadventure at the time of the procedure: Secondary | ICD-10-CM | POA: Insufficient documentation

## 2021-08-29 DIAGNOSIS — J45909 Unspecified asthma, uncomplicated: Secondary | ICD-10-CM | POA: Insufficient documentation

## 2021-08-29 DIAGNOSIS — Z7951 Long term (current) use of inhaled steroids: Secondary | ICD-10-CM | POA: Insufficient documentation

## 2021-08-29 DIAGNOSIS — T8484XA Pain due to internal orthopedic prosthetic devices, implants and grafts, initial encounter: Secondary | ICD-10-CM | POA: Diagnosis present

## 2021-08-29 DIAGNOSIS — X58XXXA Exposure to other specified factors, initial encounter: Secondary | ICD-10-CM | POA: Diagnosis not present

## 2021-08-29 HISTORY — PX: HARDWARE REMOVAL: SHX979

## 2021-08-29 SURGERY — REMOVAL, HARDWARE
Anesthesia: Regional | Site: Ankle | Laterality: Left

## 2021-08-29 MED ORDER — PHENYLEPHRINE 40 MCG/ML (10ML) SYRINGE FOR IV PUSH (FOR BLOOD PRESSURE SUPPORT)
PREFILLED_SYRINGE | INTRAVENOUS | Status: DC | PRN
  Administered 2021-08-29 (×2): 80 ug via INTRAVENOUS

## 2021-08-29 MED ORDER — ORAL CARE MOUTH RINSE: 15.0000 mL | Freq: Once | OROMUCOSAL | Status: AC

## 2021-08-29 MED ORDER — ONDANSETRON HCL 4 MG/2ML IJ SOLN
INTRAMUSCULAR | Status: DC | PRN
  Administered 2021-08-29: 4 mg via INTRAVENOUS

## 2021-08-29 MED ORDER — PROPOFOL 500 MG/50ML IV EMUL
INTRAVENOUS | Status: DC | PRN
  Administered 2021-08-29: 75 ug/kg/min via INTRAVENOUS

## 2021-08-29 MED ORDER — ONDANSETRON HCL 4 MG/2ML IJ SOLN: 4.0000 mg | Freq: Once | INTRAMUSCULAR | Status: DC | PRN

## 2021-08-29 MED ORDER — OXYCODONE HCL 5 MG/5ML PO SOLN: 5.0000 mg | Freq: Once | ORAL | Status: DC | PRN

## 2021-08-29 MED ORDER — LACTATED RINGERS IV SOLN: INTRAVENOUS | Status: DC

## 2021-08-29 MED ORDER — CHLORHEXIDINE GLUCONATE 0.12 % MT SOLN
15.0000 mL | Freq: Once | OROMUCOSAL | Status: AC
  Administered 2021-08-29: 13:00:00 15 mL via OROMUCOSAL
  Filled 2021-08-29: qty 15

## 2021-08-29 MED ORDER — MIDAZOLAM HCL 2 MG/2ML IJ SOLN: 2.0000 mg | Freq: Once | INTRAMUSCULAR | Status: AC

## 2021-08-29 MED ORDER — BUPIVACAINE-EPINEPHRINE (PF) 0.5% -1:200000 IJ SOLN
INTRAMUSCULAR | Status: DC | PRN
  Administered 2021-08-29: 20 mL via PERINEURAL

## 2021-08-29 MED ORDER — FENTANYL CITRATE (PF) 100 MCG/2ML IJ SOLN: 100.0000 ug | Freq: Once | INTRAMUSCULAR | Status: AC

## 2021-08-29 MED ORDER — ACETAMINOPHEN 10 MG/ML IV SOLN: 1000.0000 mg | Freq: Once | INTRAVENOUS | Status: DC | PRN

## 2021-08-29 MED ORDER — ONDANSETRON HCL 4 MG PO TABS: 4.0000 mg | ORAL_TABLET | Freq: Every day | ORAL | 1 refills | Status: AC | PRN

## 2021-08-29 MED ORDER — OXYCODONE HCL 5 MG PO TABS: 5.0000 mg | ORAL_TABLET | Freq: Once | ORAL | Status: DC | PRN

## 2021-08-29 MED ORDER — HYDROCODONE-ACETAMINOPHEN 5-325 MG PO TABS: 1.0000 | ORAL_TABLET | Freq: Four times a day (QID) | ORAL | 0 refills | Status: DC | PRN

## 2021-08-29 MED ORDER — CLONIDINE HCL (ANALGESIA) 100 MCG/ML EP SOLN
EPIDURAL | Status: DC | PRN
  Administered 2021-08-29: 100 ug

## 2021-08-29 MED ORDER — 0.9 % SODIUM CHLORIDE (POUR BTL) OPTIME
TOPICAL | Status: DC | PRN
  Administered 2021-08-29: 15:00:00 1000 mL

## 2021-08-29 MED ORDER — EPHEDRINE SULFATE-NACL 50-0.9 MG/10ML-% IV SOSY
PREFILLED_SYRINGE | INTRAVENOUS | Status: DC | PRN
  Administered 2021-08-29: 10 mg via INTRAVENOUS

## 2021-08-29 MED ORDER — PROPOFOL 10 MG/ML IV BOLUS
INTRAVENOUS | Status: DC | PRN
  Administered 2021-08-29 (×2): 20 mg via INTRAVENOUS

## 2021-08-29 MED ORDER — HYDROMORPHONE HCL 1 MG/ML IJ SOLN: 0.2500 mg | INTRAMUSCULAR | Status: DC | PRN

## 2021-08-29 MED ORDER — BUPIVACAINE HCL (PF) 0.5 % IJ SOLN
INTRAMUSCULAR | Status: DC | PRN
  Administered 2021-08-29: 15 mL via PERINEURAL

## 2021-08-29 MED ORDER — AMISULPRIDE (ANTIEMETIC) 5 MG/2ML IV SOLN: 10.0000 mg | Freq: Once | INTRAVENOUS | Status: DC | PRN

## 2021-08-29 MED ORDER — MIDAZOLAM HCL 2 MG/2ML IJ SOLN
INTRAMUSCULAR | Status: AC
  Administered 2021-08-29: 14:00:00 2 mg via INTRAVENOUS
  Filled 2021-08-29: qty 2

## 2021-08-29 MED ORDER — CEFAZOLIN SODIUM-DEXTROSE 2-4 GM/100ML-% IV SOLN
2.0000 g | INTRAVENOUS | Status: AC
  Administered 2021-08-29: 15:00:00 2 g via INTRAVENOUS
  Filled 2021-08-29: qty 100

## 2021-08-29 MED ORDER — FENTANYL CITRATE (PF) 100 MCG/2ML IJ SOLN
INTRAMUSCULAR | Status: AC
  Administered 2021-08-29: 14:00:00 100 ug via INTRAVENOUS
  Filled 2021-08-29: qty 2

## 2021-08-29 SURGICAL SUPPLY — 43 items
ALCOHOL 70% 16 OZ (MISCELLANEOUS) ×3 IMPLANT
BAG COUNTER SPONGE SURGICOUNT (BAG) ×2 IMPLANT
BAG SURGICOUNT SPONGE COUNTING (BAG) ×1
BNDG COHESIVE 4X5 TAN STRL (GAUZE/BANDAGES/DRESSINGS) ×3 IMPLANT
BNDG ELASTIC 4X5.8 VLCR STR LF (GAUZE/BANDAGES/DRESSINGS) ×2 IMPLANT
BNDG ELASTIC 6X5.8 VLCR STR LF (GAUZE/BANDAGES/DRESSINGS) IMPLANT
CANISTER SUCT 3000ML PPV (MISCELLANEOUS) ×3 IMPLANT
COVER SURGICAL LIGHT HANDLE (MISCELLANEOUS) ×3 IMPLANT
CUFF TOURN SGL QUICK 34 (TOURNIQUET CUFF) ×3
CUFF TRNQT CYL 34X4.125X (TOURNIQUET CUFF) IMPLANT
DRAPE C-ARM 42X72 X-RAY (DRAPES) ×3 IMPLANT
DRAPE U-SHAPE 47X51 STRL (DRAPES) ×3 IMPLANT
DRSG ADAPTIC 3X8 NADH LF (GAUZE/BANDAGES/DRESSINGS) ×3 IMPLANT
DRSG PAD ABDOMINAL 8X10 ST (GAUZE/BANDAGES/DRESSINGS) ×1 IMPLANT
DURAPREP 26ML APPLICATOR (WOUND CARE) ×3 IMPLANT
ELECT REM PT RETURN 9FT ADLT (ELECTROSURGICAL) ×3
ELECTRODE REM PT RTRN 9FT ADLT (ELECTROSURGICAL) ×1 IMPLANT
GAUZE SPONGE 4X4 12PLY STRL (GAUZE/BANDAGES/DRESSINGS) ×3 IMPLANT
GAUZE SPONGE 4X4 12PLY STRL LF (GAUZE/BANDAGES/DRESSINGS) ×2 IMPLANT
GLOVE SRG 8 PF TXTR STRL LF DI (GLOVE) ×2 IMPLANT
GLOVE SURG ENC MOIS LTX SZ7.5 (GLOVE) ×6 IMPLANT
GLOVE SURG UNDER POLY LF SZ8 (GLOVE) ×6
GOWN STRL REUS W/ TWL LRG LVL3 (GOWN DISPOSABLE) ×2 IMPLANT
GOWN STRL REUS W/ TWL XL LVL3 (GOWN DISPOSABLE) ×2 IMPLANT
GOWN STRL REUS W/TWL LRG LVL3 (GOWN DISPOSABLE) ×6
GOWN STRL REUS W/TWL XL LVL3 (GOWN DISPOSABLE) ×6
KIT BASIN OR (CUSTOM PROCEDURE TRAY) ×3 IMPLANT
KIT TURNOVER KIT B (KITS) ×3 IMPLANT
NS IRRIG 1000ML POUR BTL (IV SOLUTION) ×3 IMPLANT
PACK ORTHO EXTREMITY (CUSTOM PROCEDURE TRAY) ×3 IMPLANT
PAD ARMBOARD 7.5X6 YLW CONV (MISCELLANEOUS) ×6 IMPLANT
PAD CAST 4YDX4 CTTN HI CHSV (CAST SUPPLIES) ×2 IMPLANT
PADDING CAST COTTON 4X4 STRL (CAST SUPPLIES) ×3
PADDING CAST COTTON 6X4 STRL (CAST SUPPLIES) ×3 IMPLANT
SUCTION FRAZIER HANDLE 10FR (MISCELLANEOUS) ×3
SUCTION TUBE FRAZIER 10FR DISP (MISCELLANEOUS) ×1 IMPLANT
SUT ETHILON 3 0 PS 1 (SUTURE) ×5 IMPLANT
SUT VIC AB 2-0 CT1 27 (SUTURE) ×6
SUT VIC AB 2-0 CT1 TAPERPNT 27 (SUTURE) ×1 IMPLANT
TOWEL GREEN STERILE (TOWEL DISPOSABLE) ×6 IMPLANT
TUBE CONNECTING 12'X1/4 (SUCTIONS) ×1
TUBE CONNECTING 12X1/4 (SUCTIONS) ×2 IMPLANT
YANKAUER SUCT BULB TIP NO VENT (SUCTIONS) ×3 IMPLANT

## 2021-08-29 NOTE — Anesthesia Procedure Notes (Addendum)
Anesthesia Regional Block: Adductor canal block   Pre-Anesthetic Checklist: , timeout performed,  Correct Patient, Correct Site, Correct Laterality,  Correct Procedure, Correct Position, site marked,  Risks and benefits discussed,  Surgical consent,  Pre-op evaluation,  At surgeon's request and post-op pain management  Laterality: Lower and Left  Prep: chloraprep       Needles:  Injection technique: Single-shot  Needle Type: Echogenic Needle     Needle Length: 9cm  Needle Gauge: 22     Additional Needles:   Procedures:,,,, ultrasound used (permanent image in chart),,    Narrative:  Start time: 08/29/2021 1:36 PM End time: 08/29/2021 1:40 PM Injection made incrementally with aspirations every 5 mL.  Performed by: Personally  Anesthesiologist: Barnet Glasgow, MD  Additional Notes: Block assessed prior to surgery. Pt tolerated procedure well.

## 2021-08-29 NOTE — Progress Notes (Signed)
Orthopedic Tech Progress Note Patient Details:  Debbie Allen 1966-06-10 [id number]  Ortho Devices Type of Ortho Device: Postop shoe/boot Ortho Device/Splint Location: left Ortho Device/Splint Interventions: Kennon Holter Kaetlin Bullen 08/29/2021, 4:19 PM Delivered post op shoe to [facility]. Spoke with RN and they are going to wait on putting in on the patient.

## 2021-08-29 NOTE — Anesthesia Postprocedure Evaluation (Signed)
Anesthesia Post Note  Patient: Debbie Allen  Procedure(s) Performed: HARDWARE REMOVAL LEFT ANKLE (Left: Ankle)     Patient location during evaluation: PACU Anesthesia Type: Regional Level of consciousness: awake and alert Pain management: pain level controlled Vital Signs Assessment: post-procedure vital signs reviewed and stable Respiratory status: spontaneous breathing, nonlabored ventilation and respiratory function stable Cardiovascular status: stable and blood pressure returned to baseline Anesthetic complications: no   No notable events documented.  Last Vitals:  Vitals:   08/29/21 1536 08/29/21 1605  BP: (!) 88/74 140/89  Pulse: 69 68  Resp: 19 18  Temp: (!) 36.4 C (!) 36.1 C  SpO2: 98% 100%    Last Pain:  Vitals:   08/29/21 1605  TempSrc:   PainSc: 0-No pain                 Audry Pili

## 2021-08-29 NOTE — Discharge Instructions (Addendum)
Orthopedic surgery discharge instructions:  -You are appropriate for full weightbearing as tolerated to the left lower extremity starting immediately after surgery.  You should use the postoperative shoe on that foot until you are able to fit sure ankle brace into a normal shoe.  You may remove your postoperative bandages 24 hours from surgery.  You may begin showering 48 hours from surgery.  Should keep your incisions covered with dry dressings and an Ace bandage.  Elevate the extremity as well when able with toes above nose.  -Apply ice to the left ankle for 20 to 30 minutes out of each hour that you are able around-the-clock.  -For mild to moderate pain use Tylenol and Advil or alternating fashion every 6 hours respectively.  For breakthrough pain use oxycodone as necessary.  - for the prevention of blood clots take an 81 mg aspirin once per day x6 weeks.

## 2021-08-29 NOTE — Anesthesia Procedure Notes (Addendum)
Anesthesia Regional Block: Popliteal block   Pre-Anesthetic Checklist: , timeout performed,  Correct Patient, Correct Site, Correct Laterality,  Correct Procedure, Correct Position, site marked,  Risks and benefits discussed,  Pre-op evaluation,  At surgeon's request and post-op pain management  Laterality: Lower and Left  Prep: Maximum Sterile Barrier Precautions used, chloraprep       Needles:  Injection technique: Single-shot  Needle Type: Echogenic Needle     Needle Length: 9cm  Needle Gauge: 21     Additional Needles:   Procedures:,,,, ultrasound used (permanent image in chart),,    Narrative:  Start time: 08/29/2021 1:30 PM End time: 08/29/2021 1:35 PM Injection made incrementally with aspirations every 5 mL.  Performed by: Personally  Anesthesiologist: Barnet Glasgow, MD  Additional Notes: Block assessed. Patient tolerated procedure well.

## 2021-08-29 NOTE — Op Note (Signed)
Date of Surgery: 08/29/2021  INDICATIONS: Ms. Prichett is a 55 y.o.-year-old female with a left painful orthopedic hardware following open reduction and internal fixation of her left ankle about a year and a half ago.;  The Patient did consent to the procedure after discussion of the risks and benefits.  PREOPERATIVE DIAGNOSIS:  Painful deep orthopedic implant left ankle  POSTOPERATIVE DIAGNOSIS: Same.  PROCEDURE: Removal of deep implant of medial and lateral ankle, left via 2 separate incisions.  SURGEON: Geralynn Rile, M.D.  ASSIST: Jonelle Sidle, PA-C  Assistant attestation: PA Mcclung was present for the entire procedure.  He participated in all critical portions..  ANESTHESIA: Regional anesthetic with IV sedation  IV FLUIDS AND URINE: See anesthesia.  ESTIMATED BLOOD LOSS: 20 mL.  IMPLANTS: None  Explants: Ar[employer] titanium plate, 8 hole x1 3.5 mm cortical screws x5 2.7 mm cortical screw x1 4.0 mm cannulated screw x2  DRAINS: None  Tourniquet:  250 mmHg left thigh for 30 minutes.  COMPLICATIONS: None.  DESCRIPTION OF PROCEDURE: The patient was brought to the operating room and placed supine on the operating table.  The patient had been signed prior to the procedure and this was documented. The patient had the anesthesia placed by the anesthesiologist.  A time-out was performed to confirm that this was the correct patient, site, side and location. The patient did receive antibiotics prior to the incision and was re-dosed during the procedure as needed at indicated intervals.  A tourniquet was placed.  The patient had the operative extremity prepped and draped in the standard surgical fashion.     We began the procedure by establishing the previously utilized lateral incision to the distal fibula.  Dissection was then carried down through skin and subcutaneous tissue to the level of the distal fibula plate.  We utilized a Soil scientist as well as electrocautery to dissect  scar tissue and periosteum off of the plate.  Once we identified the plate we did this from proximal to distal.  We then identified the 5 screws in the lateral plate.  We remove these with the screwdriver.  We then were able to lever the plate off of bone.  These were passed off to the back table.  We then identified the interfragmentary screw which was going from anterior to posterior.  We cleaned off the screw head as well with a freer elevator.  We then remove that screw as well.  This was passed off the back table.  We then used rondure to clean the screw hole sites.  We then copiously lavaged this wound.  Attention was then turned to the medial side of the left ankle.  Utilizing a separate medial incision we then dissected down through skin and subcutaneous tissue through the previously utilized incision to the medial malleolus.  We identified with freer elevator and electrocautery the 2 cannulated screws.  We were able to address removing these without utilizing any K wire.  We were then able to use the screwdriver to back both of the screws out without issue.  That wound was then copiously irrigated.  Both wounds medial lateral were then closed in layers.  We utilized 2-0 Vicryl for the deep dermal layer and 3-0 nylon for the skin.  The leg was then cleaned and dried and standard sterile dressings applied with a soft Ace bandage on top.  There were no noted intraoperative complications.  All counts were correct x2.  Patient was transported to PACU in stable condition.  POSTOPERATIVE PLAN:  Ms. zollars will be in her soft dressing with a postop shoe for approximately 2 to 4 days.  She will do this until she is able to remove her soft bandages on 48 hours from surgery.  When she is able to apply her ASO ankle brace she can then get back to a regular shoe.  She is fine to weight-bear as tolerated.  I would like her to take 81 mg aspirin once per day for 6 weeks for DVT prophylaxis.  I will see her back  in the office in 2 weeks for wound check.

## 2021-08-29 NOTE — Transfer of Care (Signed)
Immediate Anesthesia Transfer of Care Note  Patient: Deztiny S Piehl  Procedure(s) Performed: HARDWARE REMOVAL LEFT ANKLE (Left: Ankle)  Patient Location: PACU  Anesthesia Type:MAC and Regional  Level of Consciousness: awake, alert  and oriented  Airway & Oxygen Therapy: Patient Spontanous Breathing  Post-op Assessment: Report given to RN and Post -op Vital signs reviewed and stable  Post vital signs: Reviewed and stable  Last Vitals:  Vitals Value Taken Time  BP 88/74 08/29/21 1536  Temp    Pulse 69 08/29/21 1538  Resp 15 08/29/21 1538  SpO2 96 % 08/29/21 1538  Vitals shown include unvalidated device data.  Last Pain:  Vitals:   08/29/21 1317  TempSrc:   PainSc: 1       Patients Stated Pain Goal: 3 (41/36/43 8377)  Complications: No notable events documented.

## 2021-08-29 NOTE — Brief Op Note (Signed)
08/29/2021  3:31 PM  PATIENT:  Debbie Allen  55 y.o. female  PRE-OPERATIVE DIAGNOSIS:  Left ankle painful hardware  POST-OPERATIVE DIAGNOSIS:  Left ankle painful hardware  PROCEDURE:  Procedure(s): HARDWARE REMOVAL LEFT ANKLE (Left)  SURGEON:  Surgeon(s) and Role:    * Nicholes Stairs, MD - Primary  PHYSICIAN ASSISTANT: Jonelle Sidle, PA-C  ANESTHESIA:   regional and IV sedation  EBL: 20 cc  BLOOD ADMINISTERED:none  DRAINS: none   LOCAL MEDICATIONS USED:  NONE  SPECIMEN:  No Specimen  DISPOSITION OF SPECIMEN:  N/A  COUNTS:  YES  TOURNIQUET:   Total Tourniquet Time Documented: Thigh (laterality) - 26 minutes Total: Thigh (laterality) - 26 minutes   DICTATION: .Note written in EPIC  PLAN OF CARE: Discharge to home after PACU  PATIENT DISPOSITION:  PACU - hemodynamically stable.   Delay start of Pharmacological VTE agent (>24hrs) due to surgical blood loss or risk of bleeding: not applicable

## 2021-08-29 NOTE — OR Nursing (Signed)
REMOVED HARDWARE SENT WITH PT

## 2021-08-29 NOTE — Anesthesia Procedure Notes (Signed)
Procedure Name: MAC Date/Time: 08/29/2021 2:51 PM Performed by: Dorthea Cove, CRNA Pre-anesthesia Checklist: Patient identified, Emergency Drugs available, Suction available, Patient being monitored and Timeout performed Patient Re-evaluated:Patient Re-evaluated prior to induction Oxygen Delivery Method: Simple face mask Preoxygenation: Pre-oxygenation with 100% oxygen Induction Type: IV induction Placement Confirmation: positive ETCO2 and CO2 detector Dental Injury: Teeth and Oropharynx as per pre-operative assessment

## 2021-08-29 NOTE — H&P (Signed)
ORTHOPAEDIC H and P  REQUESTING PHYSICIAN: Nicholes Stairs, MD  PCP:  Celene Squibb, MD  Chief Complaint: Painful hardware left ankle  HPI: Debbie Allen is a 55 y.o. female who complains of left ankle hardware pain.  She is now about a year and 3 months out from open reduction internal fixation for a bimalleolar ankle fracture.  She has had some pain medially and also some pain laterally from the retained hardware.  Here today for removal.  No new complaints.  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 Debara Pickett but never documented)   Umbilical hernia    Past Surgical History:  Procedure Laterality Date   COLONOSCOPY  07/2019   DILATION AND CURETTAGE OF UTERUS     LEFT HEART CATH     ORIF ANKLE FRACTURE Left 05/11/2020   Procedure: OPEN REDUCTION INTERNAL FIXATION (ORIF) ANKLE FRACTURE;  Surgeon: Nicholes Stairs, MD;  Location: [facility];  Service: Orthopedics;  Laterality: Left;   Social History   Socioeconomic History   Marital status: Married    Spouse name: Not on file   Number of children: 2   Years of education: Not on file   Highest education level: Not on file  Occupational History   Occupation: retired Pharmacist, hospital  Tobacco Use   Smoking status: Never   Smokeless tobacco: Never  Vaping Use   Vaping Use: Never used  Substance and Sexual Activity   Alcohol use: Yes    Comment: Rarely   Drug use: No   Sexual activity: Not on file  Other Topics Concern   Not on file  Social History Narrative   Not on  file   Social Determinants of Health   Financial Resource Strain: Not on file  Food Insecurity: Not on file  Transportation Needs: Not on file  Physical Activity: Not on file  Stress: Not on file  Social Connections: Not on file   Family History  Problem Relation Age of Onset   Heart disease Father 23   Hyperlipidemia Father    Colon polyps Father        benign   Hyperlipidemia Sister    Hypertension Paternal Grandmother    Heart disease Paternal Grandfather    Heart attack Paternal Grandfather    Colon cancer Neg Hx    Esophageal cancer Neg Hx    Rectal cancer Neg Hx    Stomach cancer Neg Hx    Allergies  Allergen Reactions   Codeine Itching   Prior to Admission medications   Medication Sig Start Date End Date Taking? Authorizing Provider  Ascorbic Acid (VITAMIN C) 1000 MG tablet Take 1,000 mg by mouth daily.   Yes [provider]  aspirin EC 81 MG tablet Take 1 tablet (81 mg total) by mouth daily. 06/04/20  Yes Thurnell Lose, MD  budesonide-formoterol [facility]) 160-4.5 MCG/ACT inhaler Inhale 2 puffs into the lungs daily as needed (asthma).   Yes  [provider]  EPINEPHrine 0.3 mg/0.3 mL IJ SOAJ injection Inject 0.3 mg into the muscle as needed for anaphylaxis. 09/17/20  Yes [provider]  esomeprazole (NEXIUM) 20 MG capsule Take 20 mg by mouth daily.   Yes [provider]  levocetirizine (XYZAL) 5 MG tablet Take 5 mg by mouth every evening. 10/01/13  Yes [provider]  levothyroxine (SYNTHROID) 25 MCG tablet Take 25 mcg by mouth daily before breakfast. 07/03/21  Yes [provider]  MELATONIN PO Take 1 tablet by mouth at bedtime as needed (sleep).   Yes [provider]  metoprolol tartrate (LOPRESSOR) 25 MG tablet TAKE ONE TABLET BY MOUTH 2 TIMES A DAY. 08/18/21  Yes Satira Sark, MD  montelukast (SINGULAIR) 10 MG tablet Take 10 mg by mouth at bedtime. 10/01/13  Yes [provider]   Multiple Vitamins-Minerals (HAIR/SKIN/NAILS/BIOTIN PO) Take 1 tablet by mouth daily.   Yes [provider]  Multiple Vitamins-Minerals (MULTIVITAMIN WITH MINERALS) tablet Take 1 tablet by mouth daily.   Yes [provider]  Olopatadine HCl 0.6 % SOLN Place 1 spray into the nose in the morning and at bedtime.   Yes [provider]  potassium chloride SA (KLOR-CON) 20 MEQ tablet TAKE 2 TABLETS BY MOUTH DAILY. Patient taking differently: Take 20 mEq by mouth 2 (two) times daily. 06/09/21  Yes Verta Ellen., NP  rosuvastatin (CRESTOR) 20 MG tablet TAKE ONE TABLET BY MOUTH ONCE DAILY. 09/16/20  Yes Satira Sark, MD  verapamil (VERELAN PM) 240 MG 24 hr capsule TAKE ONE CAPSULE BY MOUTH ONCE DAILY. 05/30/21  Yes Satira Sark, MD  albuterol (PROVENTIL HFA;VENTOLIN HFA) 108 (90 BASE) MCG/ACT inhaler Inhale 2 puffs into the lungs every 6 (six) hours as needed for wheezing or shortness of breath.    [provider]  ketotifen (ZADITOR) 0.025 % ophthalmic solution Place 1 drop into both eyes 2 (two) times daily as needed (allergies).    [provider]  meclizine (ANTIVERT) 12.5 MG tablet Take 25 mg by mouth 3 (three) times daily as needed for dizziness.    [provider]   No results found.  Positive ROS: All other systems have been reviewed and were otherwise negative with the exception of those mentioned in the HPI and as above.  Physical Exam: General: Alert, no acute distress Cardiovascular: No pedal edema Respiratory: No cyanosis, no use of accessory musculature GI: No organomegaly, abdomen is soft and non-tender Skin: No lesions in the area of chief complaint Neurologic: Sensation intact distally Psychiatric: Patient is competent for consent with normal mood and affect Lymphatic: No axillary or cervical lymphadenopathy  MUSCULOSKELETAL: Left lower extremity is warm and well-perfused.  Nicely healed medial lateral incisions.   No open wounds.  Neurovascular intact.  Assessment: Painful hardware, left ankle  Plan: Plans to proceed today with removal of deep orthopedic implants.  We discussed the risk of bleeding, infection, damage to surrounding nerves and vessels, stiffness, nerve damage, arthritis, wound complications, DVT, the risk of anesthesia.  She has provided informed consent.  -She will have a soft dressing postoperatively and can be weightbearing as tolerated in a fracture shoe.    Nicholes Stairs, MD Cell [phone]    08/29/2021 2:19 PM

## 2021-08-30 ENCOUNTER — Encounter (HOSPITAL_COMMUNITY): Payer: Self-pay | Admitting: Orthopedic Surgery

## 2021-09-02 ENCOUNTER — Other Ambulatory Visit (HOSPITAL_COMMUNITY): Payer: Self-pay | Admitting: Family Medicine

## 2021-09-02 DIAGNOSIS — Z1231 Encounter for screening mammogram for malignant neoplasm of breast: Secondary | ICD-10-CM

## 2021-09-02 DIAGNOSIS — M8580 Other specified disorders of bone density and structure, unspecified site: Secondary | ICD-10-CM

## 2021-09-03 ENCOUNTER — Ambulatory Visit (HOSPITAL_COMMUNITY)
Admission: RE | Admit: 2021-09-03 | Discharge: 2021-09-03 | Disposition: A | Discharge status: Home / Self Care | Payer: BC Managed Care – PPO | Source: Ambulatory Visit | Attending: Family Medicine | Admitting: Family Medicine

## 2021-09-03 ENCOUNTER — Other Ambulatory Visit: Payer: Self-pay

## 2021-09-03 DIAGNOSIS — M8580 Other specified disorders of bone density and structure, unspecified site: Secondary | ICD-10-CM | POA: Insufficient documentation

## 2021-09-19 ENCOUNTER — Telehealth: Payer: Self-pay | Admitting: Cardiology

## 2021-09-19 MED ORDER — POTASSIUM CHLORIDE CRYS ER 20 MEQ PO TBCR: 40.0000 meq | EXTENDED_RELEASE_TABLET | Freq: Every day | ORAL | 3 refills | Status: DC

## 2021-09-19 NOTE — Telephone Encounter (Signed)
Complete

## 2021-09-19 NOTE — Telephone Encounter (Signed)
°*  STAT* If patient is at the pharmacy, call can be transferred to refill team.   1. Which medications need to be refilled? (please list name of each medication and dose if known)  potassium chloride SA (KLOR-CON) 20 MEQ tablet  2. Which pharmacy/location (including street and city if local pharmacy) is medication to be sent to? [place], [address]  3. Do they need a 30 day or 90 day supply? 90 with refills  Patient is out of medication

## 2021-09-22 ENCOUNTER — Other Ambulatory Visit: Payer: Self-pay | Admitting: Cardiology

## 2021-09-22 ENCOUNTER — Other Ambulatory Visit: Payer: Self-pay

## 2021-09-22 ENCOUNTER — Encounter: Payer: Self-pay | Admitting: Emergency Medicine

## 2021-09-22 ENCOUNTER — Ambulatory Visit
Admission: EM | Admit: 2021-09-22 | Discharge: 2021-09-22 | Disposition: A | Discharge status: Home / Self Care | Payer: BC Managed Care – PPO | Attending: Urgent Care | Admitting: Urgent Care

## 2021-09-22 DIAGNOSIS — H9202 Otalgia, left ear: Secondary | ICD-10-CM

## 2021-09-22 DIAGNOSIS — J069 Acute upper respiratory infection, unspecified: Secondary | ICD-10-CM

## 2021-09-22 DIAGNOSIS — R070 Pain in throat: Secondary | ICD-10-CM | POA: Diagnosis not present

## 2021-09-22 DIAGNOSIS — J4530 Mild persistent asthma, uncomplicated: Secondary | ICD-10-CM

## 2021-09-22 LAB — POCT RAPID STREP A (OFFICE): Rapid Strep A Screen: NEGATIVE

## 2021-09-22 MED ORDER — PREDNISONE 50 MG PO TABS
50.0000 mg | ORAL_TABLET | Freq: Every day | ORAL | 0 refills | Status: DC
Start: 2021-09-22 — End: 2022-12-16

## 2021-09-22 NOTE — ED Triage Notes (Addendum)
Pt reports sore throat, pain with swallowing since Saturday. Pt reports intermittent fevers, ear pain. Pt reports last took advil this am.last documented fever at home 100.5.

## 2021-09-22 NOTE — ED Provider Notes (Signed)
[facility]   MRN: [id number] DOB: 1965/12/24  Subjective:   Debbie Allen is a 56 y.o. female presenting for 3-day history of acute onset throat pain, drainage, painful swallowing, intermittent left ear pain and fevers.  Patient had flu and COVID-19 about 1.5-2 months ago.  She is also taken her flu shot.  No chest pain, shortness of breath or wheezing.  Patient does have a history of asthma and allergic rhinitis.  She is not a smoker.  No current facility-administered medications for this encounter.  Current Outpatient Medications:    Ascorbic Acid (VITAMIN C) 1000 MG tablet, Take 1,000 mg by mouth daily., Disp: , Rfl:    aspirin EC 81 MG tablet, Take 1 tablet (81 mg total) by mouth daily., Disp: 90 tablet, Rfl: 3   budesonide-formoterol (SYMBICORT) 160-4.5 MCG/ACT inhaler, Inhale 2 puffs into the lungs daily as needed (asthma)., Disp: , Rfl:    esomeprazole (NEXIUM) 20 MG capsule, Take 20 mg by mouth daily., Disp: , Rfl:    levocetirizine (XYZAL) 5 MG tablet, Take 5 mg by mouth every evening., Disp: , Rfl:    levothyroxine (SYNTHROID) 25 MCG tablet, Take 25 mcg by mouth daily before breakfast., Disp: , Rfl:    metoprolol tartrate (LOPRESSOR) 25 MG tablet, TAKE ONE TABLET BY MOUTH 2 TIMES A DAY., Disp: 180 tablet, Rfl: 2   montelukast (SINGULAIR) 10 MG tablet, Take 10 mg by mouth at bedtime., Disp: , Rfl:    Multiple Vitamins-Minerals (HAIR/SKIN/NAILS/BIOTIN PO), Take 1 tablet by mouth daily., Disp: , Rfl:    Multiple Vitamins-Minerals (MULTIVITAMIN WITH MINERALS) tablet, Take 1 tablet by mouth daily., Disp: , Rfl:    Olopatadine HCl 0.6 % SOLN, Place 1 spray into the nose in the morning and at bedtime., Disp: , Rfl:    potassium chloride SA (KLOR-CON M) 20 MEQ tablet, Take 2 tablets (40 mEq total) by mouth daily., Disp: 180 tablet, Rfl: 3   rosuvastatin (CRESTOR) 20 MG tablet, TAKE ONE TABLET BY MOUTH ONCE DAILY., Disp: 90 tablet, Rfl: 0   verapamil (VERELAN PM) 240 MG 24  hr capsule, TAKE ONE CAPSULE BY MOUTH ONCE DAILY., Disp: 90 capsule, Rfl: 3   albuterol (PROVENTIL HFA;VENTOLIN HFA) 108 (90 BASE) MCG/ACT inhaler, Inhale 2 puffs into the lungs every 6 (six) hours as needed for wheezing or shortness of breath., Disp: , Rfl:    EPINEPHrine 0.3 mg/0.3 mL IJ SOAJ injection, Inject 0.3 mg into the muscle as needed for anaphylaxis., Disp: , Rfl:    HYDROcodone-acetaminophen (NORCO) 5-325 MG tablet, Take 1 tablet by mouth every 6 (six) hours as needed for moderate pain., Disp: 30 tablet, Rfl: 0   ketotifen (ZADITOR) 0.025 % ophthalmic solution, Place 1 drop into both eyes 2 (two) times daily as needed (allergies)., Disp: , Rfl:    meclizine (ANTIVERT) 12.5 MG tablet, Take 25 mg by mouth 3 (three) times daily as needed for dizziness., Disp: , Rfl:    MELATONIN PO, Take 1 tablet by mouth at bedtime as needed (sleep)., Disp: , Rfl:    ondansetron (ZOFRAN) 4 MG tablet, Take 1 tablet (4 mg total) by mouth daily as needed for nausea or vomiting., Disp: 30 tablet, Rfl: 1   Allergies  Allergen Reactions   Codeine Itching    Past Medical History:  Diagnosis Date   Acid reflux    Allergy    Ankle fracture, left    Status post surgery and with neuropathy   Aortic regurgitation    Asymptomatic  Arthritis    Asthma    Colon polyps    Concussion    Childhood   History of left heart catheterization 2008   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 Debara Pickett but never documented)   Umbilical hernia      Past Surgical History:  Procedure Laterality Date   COLONOSCOPY  07/2019   DILATION AND CURETTAGE OF UTERUS     HARDWARE REMOVAL Left 08/29/2021   Procedure: HARDWARE REMOVAL LEFT ANKLE;  Surgeon: Nicholes Stairs, MD;  Location: [facility];  Service: Orthopedics;  Laterality: Left;   LEFT HEART  CATH     ORIF ANKLE FRACTURE Left 05/11/2020   Procedure: OPEN REDUCTION INTERNAL FIXATION (ORIF) ANKLE FRACTURE;  Surgeon: Nicholes Stairs, MD;  Location: [facility];  Service: Orthopedics;  Laterality: Left;    Family History  Problem Relation Age of Onset   Heart disease Father 20   Hyperlipidemia Father    Colon polyps Father        benign   Hyperlipidemia Sister    Hypertension Paternal Grandmother    Heart disease Paternal Grandfather    Heart attack Paternal Grandfather    Colon cancer Neg Hx    Esophageal cancer Neg Hx    Rectal cancer Neg Hx    Stomach cancer Neg Hx     Social History   Tobacco Use   Smoking status: Never   Smokeless tobacco: Never  Vaping Use   Vaping Use: Never used  Substance Use Topics   Alcohol use: Yes    Comment: Rarely   Drug use: No    ROS   Objective:   Vitals: BP (!) 144/83 (BP Location: Right Arm)    Pulse 66    Temp 98 F (36.7 C) (Oral)    Resp 18    Ht 5\' 2"  (1.575 m)    Wt 178 lb (80.7 kg)    LMP 05/31/2014    SpO2 97%    BMI 32.56 kg/m   Physical Exam Constitutional:      General: She is not in acute distress.    Appearance: Normal appearance. She is well-developed and normal weight. She is not ill-appearing, toxic-appearing or diaphoretic.  HENT:     Head: Normocephalic and atraumatic.     Right Ear: Tympanic membrane, ear canal and external ear normal. No drainage, swelling or tenderness. No middle ear effusion. There is no impacted cerumen. Tympanic membrane is not erythematous.     Left Ear: Tympanic membrane, ear canal and external ear normal. No drainage, swelling or tenderness.  No middle ear effusion. There is no impacted cerumen. Tympanic membrane is not erythematous.     Nose: Nose normal. No congestion or rhinorrhea.     Mouth/Throat:     Mouth: Mucous membranes are moist.     Pharynx: No pharyngeal swelling, oropharyngeal exudate, posterior oropharyngeal erythema or uvula swelling.     Tonsils: No tonsillar  exudate or tonsillar abscesses. 0 on the right. 0 on the left.     Comments: Thick streaks postnasal drainage overlying pharynx. Eyes:     General: No scleral icterus.       Right eye: No discharge.        Left eye: No discharge.     Extraocular Movements: Extraocular movements intact.     Right eye: Normal  extraocular motion.     Left eye: Normal extraocular motion.     Conjunctiva/sclera: Conjunctivae normal.  Cardiovascular:     Rate and Rhythm: Normal rate and regular rhythm.     Pulses: Normal pulses.     Heart sounds: Normal heart sounds. No murmur heard.   No friction rub. No gallop.  Pulmonary:     Effort: Pulmonary effort is normal. No respiratory distress.     Breath sounds: Normal breath sounds. No stridor. No wheezing, rhonchi or rales.  Musculoskeletal:     Cervical back: Normal range of motion and neck supple.  Lymphadenopathy:     Cervical: No cervical adenopathy.  Skin:    General: Skin is warm and dry.     Findings: No rash.  Neurological:     General: No focal deficit present.     Mental Status: She is alert and oriented to person, place, and time.  Psychiatric:        Mood and Affect: Mood normal.        Behavior: Behavior normal.        Thought Content: Thought content normal.    Results for orders placed or performed during the hospital encounter of 09/22/21 (from the past 24 hour(s))  POCT rapid strep A     Status: None   Collection Time: 09/22/21 10:39 AM  Result Value Ref Range   Rapid Strep A Screen Negative Negative    Assessment and Plan :   PDMP not reviewed this encounter.  1. Throat pain   2. Viral URI with cough   3. Mild persistent asthma without complication   4. Left ear pain     Throat culture pending.  Recommended an oral prednisone course in the context of allergic rhinitis and asthma to help her with her respiratory symptoms.  The supportive care otherwise. Deferred imaging given clear cardiopulmonary exam, hemodynamically  stable vital signs. Counseled patient on potential for adverse effects with medications prescribed/recommended today, ER and return-to-clinic precautions discussed, patient verbalized understanding.    Jaynee Eagles, PA-C 09/22/21 1105

## 2021-09-25 LAB — CULTURE, GROUP A STREP (THRC)

## 2021-12-21 ENCOUNTER — Other Ambulatory Visit: Payer: Self-pay | Admitting: Cardiology

## 2022-02-09 ENCOUNTER — Other Ambulatory Visit: Payer: Self-pay | Admitting: Cardiology

## 2022-03-16 ENCOUNTER — Other Ambulatory Visit: Payer: Self-pay | Admitting: Cardiology

## 2022-05-25 ENCOUNTER — Other Ambulatory Visit: Payer: Self-pay | Admitting: Cardiology

## 2022-08-15 ENCOUNTER — Other Ambulatory Visit: Payer: Self-pay | Admitting: Cardiology

## 2022-08-28 ENCOUNTER — Ambulatory Visit (HOSPITAL_COMMUNITY)
Admission: RE | Admit: 2022-08-28 | Discharge: 2022-08-28 | Disposition: A | Discharge status: Home / Self Care | Payer: BC Managed Care – PPO | Source: Ambulatory Visit | Attending: Family Medicine | Admitting: Family Medicine

## 2022-08-28 ENCOUNTER — Telehealth: Payer: Self-pay | Admitting: *Deleted

## 2022-08-28 ENCOUNTER — Other Ambulatory Visit (HOSPITAL_COMMUNITY): Payer: Self-pay | Admitting: Family Medicine

## 2022-08-28 DIAGNOSIS — J069 Acute upper respiratory infection, unspecified: Secondary | ICD-10-CM | POA: Insufficient documentation

## 2022-08-28 NOTE — Telephone Encounter (Signed)
Left message for the pt to call back and schedule an in office appt for pre op clearance. If surgery is planned for May, then in office appt cannot be more than 2 months prior per pre op protocol.

## 2022-08-28 NOTE — Telephone Encounter (Signed)
   Pre-operative Risk Assessment    Patient Name: Debbie Allen  DOB: Mar 03, 1966 MRN: [id number]      Request for Surgical Clearance    Procedure:   RIGHT KNEE SCOPE ACL RECONSTRUCTION  Date of Surgery:  Clearance TBD (MAY 2024)                                Surgeon:  DR. Edmonia Lynch Surgeon's Group or Practice Name:  [facility] Phone number:  [phone] EXT [phone] attn: Wilder Fax number:  [phone]   Type of Clearance Requested:   - Medical ; ASA   Type of Anesthesia:   CHOICE WITH NERVE BLOCK   Additional requests/questions:    Jiles Prows   08/28/2022, 2:04 PM

## 2022-08-28 NOTE — Telephone Encounter (Signed)
    Primary Cardiologist:Samuel Domenic Polite, MD  Chart reviewed as part of pre-operative protocol coverage. Because of Debbie Allen's past medical history and time since last visit, he/she will require a follow-up visit in order to better assess preoperative cardiovascular risk.  Pre-op covering staff: - Please schedule appointment in office  and call patient to inform them. - Please contact requesting surgeon's office via preferred method (i.e, phone, fax) to inform them of need for appointment prior to surgery.  If applicable, this message will also be routed to pharmacy pool and/or primary cardiologist for input on holding anticoagulant/antiplatelet agent as requested below so that this information is available at time of patient's appointment.   Deberah Pelton, NP  08/28/2022, 2:31 PM

## 2022-09-04 NOTE — Telephone Encounter (Signed)
Pt has appt 10/13/22 with Melina Copa, PAC for pre op clearance. I will forward notes to all parties involved in this matter.

## 2022-09-14 ENCOUNTER — Other Ambulatory Visit: Payer: Self-pay | Admitting: Cardiology

## 2022-10-12 NOTE — Progress Notes (Unsigned)
Cardiology Office Note    Date:  10/13/2022   ID:  Debbie Allen, DOB Feb 12, 1966, MRN [id number]  PCP:  Celene Squibb, MD  Cardiologist:  Rozann Lesches, MD  Electrophysiologist:  None   Chief Complaint: preop, episodic low HR  History of Present Illness:   Debbie Allen is a 57 y.o. 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 Nuc 06/2014 was normal, EF 51%. Last echo 04/2020 EF 55-60%, G2dd, mild AI. She had a complex admission at that time for acute hypoxic respiratory failure due to Covid-19 requiring supplemental O2. She has a history of palpitations with presumed prior PSVT, maintained on verapamil + Lopressor and doing well at last OV 07/2021.  She is seen today for pre-op evaluation for knee surgery but also noting episodic low HR. Most of the time her HR is in the 60s but has noticed HR in the 40s at times. During these episodes she feels as though her heart is going to pump forward a more forceful beat. She has picked up a few of these on Kardia. One of the Kardia tracings showed HR 48bpm with sinus bradycardia with occasional junctional beating. A similar episode was captured when HR was in the 50s-60s with occasional junctional beat. There did not appear to be any prolonged pauses. Today's EKG did capture a 1.8sec sinus pause. She does not feel dizzy. She has not had any CP or SOB. No syncope. She has been exercising on a recumbent bike without any recent angina or dyspnea. 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 She had labs by PCP in December.   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 Debara Pickett but never documented)   Umbilical hernia     Past Surgical History:  Procedure Laterality Date   COLONOSCOPY  07/2019   DILATION AND CURETTAGE OF UTERUS     HARDWARE REMOVAL Left 08/29/2021   Procedure: HARDWARE REMOVAL LEFT ANKLE;  Surgeon: Nicholes Stairs, MD;  Location: [facility];  Service: Orthopedics;  Laterality: Left;   LEFT HEART CATH     ORIF ANKLE FRACTURE Left 05/11/2020   Procedure: OPEN REDUCTION INTERNAL FIXATION (ORIF) ANKLE FRACTURE;  Surgeon: Nicholes Stairs, MD;  Location: [facility];  Service: Orthopedics;  Laterality: Left;    Current Medications: Current Meds  Medication Sig   albuterol (PROVENTIL HFA;VENTOLIN HFA) 108 (90 BASE) MCG/ACT inhaler Inhale 2 puffs into  the lungs every 6 (six) hours as needed for wheezing or shortness of breath.   Ascorbic Acid (VITAMIN C) 1000 MG tablet Take 1,000 mg by mouth daily.   aspirin EC 81 MG tablet Take 1 tablet (81 mg total) by mouth daily.   budesonide-formoterol (SYMBICORT) 160-4.5 MCG/ACT inhaler Inhale 2 puffs into the lungs daily as needed (asthma).   EPINEPHrine 0.3 mg/0.3 mL IJ SOAJ injection Inject 0.3 mg into the muscle as needed for anaphylaxis.   esomeprazole (NEXIUM) 20 MG capsule Take 20 mg by mouth daily.   HYDROcodone-acetaminophen (NORCO) 5-325 MG tablet Take 1  tablet by mouth every 6 (six) hours as needed for moderate pain.   ketotifen (ZADITOR) 0.025 % ophthalmic solution Place 1 drop into both eyes 2 (two) times daily as needed (allergies).   levocetirizine (XYZAL) 5 MG tablet Take 5 mg by mouth every evening.   levothyroxine (SYNTHROID) 25 MCG tablet Take 25 mcg by mouth daily before breakfast.   meclizine (ANTIVERT) 12.5 MG tablet Take 25 mg by mouth 3 (three) times daily as needed for dizziness.   MELATONIN PO Take 1 tablet by mouth at bedtime as needed (sleep).   metoprolol tartrate (LOPRESSOR) 25 MG tablet TAKE ONE TABLET BY MOUTH 2 TIMES A DAY.   montelukast (SINGULAIR) 10 MG tablet Take 10 mg by mouth at bedtime.   Multiple Vitamins-Minerals (MULTIVITAMIN WITH MINERALS) tablet Take 1 tablet by mouth daily.   Olopatadine HCl 0.6 % SOLN Place 1 spray into the nose in the morning and at bedtime.   potassium chloride SA (KLOR-CON M) 20 MEQ tablet TAKE 2 TABLETS BY MOUTH DAILY.   predniSONE (DELTASONE) 50 MG tablet Take 1 tablet (50 mg total) by mouth daily with breakfast.   rosuvastatin (CRESTOR) 20 MG tablet TAKE ONE TABLET BY MOUTH ONCE DAILY.   Semaglutide,0.25 or 0.'5MG'$ /DOS, (OZEMPIC, 0.25 OR 0.5 MG/DOSE,) 2 MG/1.5ML SOPN Ozempic 0.25 mg or 0.5 mg (2 mg/1.5 mL) subcutaneous pen injector   verapamil (VERELAN PM) 240 MG 24 hr capsule TAKE ONE CAPSULE BY MOUTH ONCE DAILY.   [DISCONTINUED] Multiple Vitamins-Minerals (HAIR/SKIN/NAILS/BIOTIN PO) Take 1 tablet by mouth daily.      Allergies:   Codeine   Social History   Socioeconomic History   Marital status: Married    Spouse name: Not on file   Number of children: 2   Years of education: Not on file   Highest education level: Not on file  Occupational History   Occupation: retired Pharmacist, hospital  Tobacco Use   Smoking status: Never   Smokeless tobacco: Never  Vaping Use   Vaping Use: Never used  Substance and Sexual Activity   Alcohol use: Yes    Comment: Rarely   Drug use: No   Sexual  activity: Not on file  Other Topics Concern   Not on file  Social History Narrative   Not on file   Social Determinants of Health   Financial Resource Strain: Not on file  Food Insecurity: Not on file  Transportation Needs: Not on file  Physical Activity: Not on file  Stress: Not on file  Social Connections: Not on file     Family History:  The patient's family history includes Colon polyps in her father; Heart attack in her paternal grandfather; Heart disease in her paternal grandfather; Heart disease (age of onset: 3) in her father; Hyperlipidemia in her father and sister; Hypertension in her paternal grandmother. There is no history of Colon cancer, Esophageal cancer, Rectal cancer, or Stomach  cancer.  ROS:   Please see the history of present illness.  All other systems are reviewed and otherwise negative.    EKG(s)/Additional Testing   EKG:  EKG is ordered today, personally reviewed, demonstrating sinus arrhythmia 59bpm (1.8sec pause),othweise no acute STT changes. PR 115m borderline short similarly reported in the past.  CV Studies: Cardiac studies reviewed are outlined and summarized above. Otherwise please see EMR for full report.  Recent Labs: No results found for requested labs within last 365 days.  Recent Lipid Panel    Component Value Date/Time   CHOL 178 03/19/2020 0914   TRIG 252 (H) 04/30/2020 1241   HDL 52 03/19/2020 0914   CHOLHDL 3.4 03/19/2020 0914   VLDL 59 (H) 03/19/2020 0914   LDLCALC 67 03/19/2020 0914    PHYSICAL EXAM:    VS:  BP 126/84   Pulse (!) 59   Ht '5\' 2"'$  (1.575 m)   Wt 158 lb (71.7 kg)   LMP 05/31/2014   SpO2 98%   BMI 28.90 kg/m   BMI: Body mass index is 28.9 kg/m.  GEN: Well nourished, well developed female in no acute distress HEENT: normocephalic, atraumatic Neck: no JVD, carotid bruits, or masses Cardiac: RRR; no murmurs, rubs, or gallops, no edema  Respiratory:  clear to auscultation bilaterally, normal work of  breathing GI: soft, nontender, nondistended, + BS MS: no deformity or atrophy Skin: warm and dry, no rash Neuro:  Alert and Oriented x 3, Strength and sensation are intact, follows commands Psych: euthymic mood, full affect  Wt Readings from Last 3 Encounters:  10/13/22 158 lb (71.7 kg)  09/22/21 178 lb (80.7 kg)  08/29/21 180 lb (81.6 kg)     ASSESSMENT & PLAN:   1. Preop cardiovascular exam - she is able to achieve over 4 METS without any exertional angina or dyspnea. However, she does appear to be having recent issues with HR which we will investigate further as outlined below before clearing for surgery. Would recommend to finalize recommendations at next visit - anticipate if all checks out OK would be OK to hold ASA as requested as well.  I will route a copy of this note to requesting surgeon to be aware final recs are forthcoming.   2. PSVT, also with baseline sinus bradycardia, and sinus arrhythmia captured today - PSVT is largely quiescent except for brief episode 1 year ago. She is noticing periodic HR in the 40s at times while awake. Some junctional beating was picked up on her KJodelle Red- looks like adequate escape rate but hard to tell on Kardia. We discussed scaling back on her verapamil versus metoprolol. She prefers the latter because it is a BID medicine. Therefore we will stop metoprolol for now and continue verapamil at present dose. Will obtain a 7 day monitor to evaluate HR trends. If she has recurrent PSVT off beta blocker, may need EP consultation. If there is nocturnal bradycardia seen on her monitor would consider sleep study. She is not sure if she stops breathing at night but will inquire - she thinks she only really snores when she is congested. Will get basic labs today as well. We'll also update echocardiogram in anticipation of surgery.  3. Mild aortic insufficiency - echo planned as above, will be able to reassess at that time.  4. Intramyocardial bridging - stable  without anginal symptoms. Lipids are followed by PCP. Dr. MMyles Gipthe prescriber on her statin. We will request copy of her lipids for our review.  Will need to follow symptoms off beta blocker.    Disposition: F/u with 4-5 weeks to allow for completion of monitor and echo.   Medication Adjustments/Labs and Tests Ordered: Current medicines are reviewed at length with the patient today.  Concerns regarding medicines are outlined above. Medication changes, Labs and Tests ordered today are summarized above and listed in the Patient Instructions accessible in Encounters.    Signed, Charlie Pitter, PA-C  10/13/2022 1:47 PM    [facility] Location in [place]. [place], [place] [address] Ph: [phone]; Fax [phone]

## 2022-10-13 ENCOUNTER — Ambulatory Visit: Payer: BC Managed Care – PPO | Attending: Physician Assistant | Admitting: Physician Assistant

## 2022-10-13 ENCOUNTER — Other Ambulatory Visit (HOSPITAL_COMMUNITY)
Admission: RE | Admit: 2022-10-13 | Discharge: 2022-10-13 | Disposition: A | Discharge status: Home / Self Care | Payer: BC Managed Care – PPO | Source: Ambulatory Visit | Attending: Physician Assistant | Admitting: Physician Assistant

## 2022-10-13 ENCOUNTER — Encounter: Payer: Self-pay | Admitting: Physician Assistant

## 2022-10-13 VITALS — BP 126/84 | HR 59 | Ht 62.0 in | Wt 158.0 lb

## 2022-10-13 DIAGNOSIS — Z01810 Encounter for preprocedural cardiovascular examination: Secondary | ICD-10-CM | POA: Insufficient documentation

## 2022-10-13 DIAGNOSIS — Q245 Malformation of coronary vessels: Secondary | ICD-10-CM

## 2022-10-13 DIAGNOSIS — R001 Bradycardia, unspecified: Secondary | ICD-10-CM | POA: Diagnosis present

## 2022-10-13 DIAGNOSIS — I471 Supraventricular tachycardia, unspecified: Secondary | ICD-10-CM

## 2022-10-13 DIAGNOSIS — I351 Nonrheumatic aortic (valve) insufficiency: Secondary | ICD-10-CM

## 2022-10-13 DIAGNOSIS — I498 Other specified cardiac arrhythmias: Secondary | ICD-10-CM | POA: Insufficient documentation

## 2022-10-13 LAB — CBC
HCT: 37.7 % (ref 36.0–46.0)
Hemoglobin: 12.4 g/dL (ref 12.0–15.0)
MCH: 28.4 pg (ref 26.0–34.0)
MCHC: 32.9 g/dL (ref 30.0–36.0)
MCV: 86.3 fL (ref 80.0–100.0)
Platelets: 281 10*3/uL (ref 150–400)
RBC: 4.37 MIL/uL (ref 3.87–5.11)
RDW: 12.7 % (ref 11.5–15.5)
WBC: 6.2 10*3/uL (ref 4.0–10.5)
nRBC: 0 % (ref 0.0–0.2)

## 2022-10-13 LAB — BASIC METABOLIC PANEL
Anion gap: 8 (ref 5–15)
BUN: 10 mg/dL (ref 6–20)
CO2: 25 mmol/L (ref 22–32)
Calcium: 8.7 mg/dL — ABNORMAL LOW (ref 8.9–10.3)
Chloride: 105 mmol/L (ref 98–111)
Creatinine, Ser: 0.9 mg/dL (ref 0.44–1.00)
GFR, Estimated: >60 mL/min (ref >60)
Glucose, Bld: 99 mg/dL (ref 70–99)
Potassium: 3.7 mmol/L (ref 3.5–5.1)
Sodium: 138 mmol/L (ref 135–145)

## 2022-10-13 LAB — MAGNESIUM: Magnesium: 2.1 mg/dL (ref 1.7–2.4)

## 2022-10-13 LAB — TSH: TSH: 1.796 u[IU]/mL (ref 0.350–4.500)

## 2022-10-13 NOTE — Patient Instructions (Signed)
Medication Instructions:  STOP Lopressor   Labwork: Cbc,bmet,tsh,magnesium today  Testing/Procedures: Your physician has requested that you have an echocardiogram. Echocardiography is a painless test that uses sound waves to create images of your heart. It provides your doctor with information about the size and shape of your heart and how well your heart's chambers and valves are working. This procedure takes approximately one hour. There are no restrictions for this procedure. Please do NOT wear cologne, perfume, aftershave, or lotions (deodorant is allowed). Please arrive 15 minutes prior to your appointment time.   Follow-Up: 4-5 weeks  Any Other Special Instructions Will Be Listed Below (If Applicable).    ZIO XT- Long Term Monitor Instructions   Your physician has requested you wear your ZIO patch monitor___7____days.   This is a single patch monitor.  [employer] supplies one patch monitor per enrollment.  Additional stickers are not available.   Please do not apply patch if you will be having a Nuclear Stress Test, Echocardiogram, Cardiac CT, MRI, or Chest Xray during the time frame you would be wearing the monitor. The patch cannot be worn during these tests.  You cannot remove and re-apply the ZIO XT patch monitor.   Your ZIO patch monitor will be sent USPS Priority mail from [facility] directly to your home address. The monitor may also be mailed to a PO BOX if home delivery is not available.   It may take 3-5 days to receive your monitor after you have been enrolled.   Once you have received you monitor, please review enclosed instructions.  Your monitor has already been registered assigning a specific monitor serial # to you.   Applying the monitor   Shave hair from upper left chest.   Hold abrader disc by orange tab.  Rub abrader in 40 strokes over left upper chest as indicated in your monitor instructions.   Clean area with 4 enclosed alcohol pads .  Use all  pads to assure are is cleaned thoroughly.  Let dry.   Apply patch as indicated in monitor instructions.  Patch will be place under collarbone on left side of chest with arrow pointing upward.   Rub patch adhesive wings for 2 minutes.Remove white label marked "1".  Remove white label marked "2".  Rub patch adhesive wings for 2 additional minutes.   While looking in a mirror, press and release button in center of patch.  A [place] green light will flash 3-4 times .  This will be your only indicator the monitor has been turned on.     Do not shower for the first 24 hours.  You may shower after the first 24 hours.   Press button if you feel a symptom. You will hear a [place] click.  Record Date, Time and Symptom in the Patient Log Book.   When you are ready to remove patch, follow instructions on last 2 pages of Patient Log Book.  Stick patch monitor onto last page of Patient Log Book.   Place Patient Log Book in [place] box.  Use locking tab on box and tape box closed securely.  The [employer] and [employer] has [employer] on it.  Please place in mailbox as soon as possible.  Your physician should have your test results approximately 7 days after the monitor has been mailed back to [facility].   Call [facility] at [phone] if you have questions regarding your ZIO XT patch monitor.  Call them immediately if you see an  orange light blinking on your monitor.   If your monitor falls off in less than 4 days contact our Monitor department at [phone].  If your monitor becomes loose or falls off after 4 days call [employer] at [phone] for suggestions on securing your monitor.    If you need a refill on your cardiac medications before your next appointment, please call your pharmacy.

## 2022-10-14 ENCOUNTER — Encounter: Payer: Self-pay | Admitting: Internal Medicine

## 2022-10-15 ENCOUNTER — Ambulatory Visit: Payer: BC Managed Care – PPO | Attending: Physician Assistant

## 2022-10-15 ENCOUNTER — Telehealth: Payer: Self-pay | Admitting: Physician Assistant

## 2022-10-15 DIAGNOSIS — R001 Bradycardia, unspecified: Secondary | ICD-10-CM

## 2022-10-15 NOTE — Telephone Encounter (Signed)
Pt returning call for lab results  

## 2022-10-16 ENCOUNTER — Other Ambulatory Visit: Payer: Self-pay | Admitting: Cardiology

## 2022-10-16 NOTE — Telephone Encounter (Signed)
Patient notified of lab results on 2/8 per C. Wilber Oliphant, RN

## 2022-10-19 ENCOUNTER — Telehealth: Payer: Self-pay | Admitting: Physician Assistant

## 2022-10-19 DIAGNOSIS — I495 Sick sinus syndrome: Secondary | ICD-10-CM

## 2022-10-19 MED ORDER — METOPROLOL SUCCINATE ER 25 MG PO TB24: 12.5000 mg | ORAL_TABLET | Freq: Every day | ORAL | 3 refills | Status: DC

## 2022-10-19 NOTE — Telephone Encounter (Signed)
STAT if HR is under 50 or over 120 (normal HR is 60-100 beats per minute)  What is your heart rate? Heart this morning was 160-180  Do you have a log of your heart rate readings (document readings)?   Do you have any other symptoms? Patient states she has an episode of tachycardia that lasted for about 30 mins this morning. She said she was taken off of her metoprolol tartrate 56m, twice a day last week because of slow heart rate.  She is wondering if she should go back on it and take it once a day. Please advise.

## 2022-10-19 NOTE — Telephone Encounter (Signed)
Patient notified and verbalized understanding. Patient had no questions or concerns at this time.  

## 2022-10-19 NOTE — Telephone Encounter (Signed)
We can try adding back the lowest dose of metoprolol back which would be the different form called metoprolol succinate 12.58m daily.  At this point would go ahead and initiate referral to EP due to concern for tachy-brady syndrome as well and keep plan for monitor and echo. Thank you!

## 2022-10-20 ENCOUNTER — Telehealth: Payer: Self-pay | Admitting: Cardiology

## 2022-10-20 DIAGNOSIS — R001 Bradycardia, unspecified: Secondary | ICD-10-CM

## 2022-10-20 NOTE — Telephone Encounter (Signed)
Pt c/o medication issue:  1. Name of Medication:   rosuvastatin (CRESTOR) 20 MG tablet  verapamil (VERELAN PM) 240 MG 24 hr capsule   2. How are you currently taking this medication (dosage and times per day)? As prescribed  3. Are you having a reaction (difficulty breathing--STAT)? No   4. What is your medication issue? Patient would like medications switched to 90 days

## 2022-10-21 MED ORDER — VERAPAMIL HCL ER 240 MG PO CP24: 240.0000 mg | ORAL_CAPSULE | Freq: Every day | ORAL | 3 refills | Status: DC

## 2022-10-21 MED ORDER — ROSUVASTATIN CALCIUM 20 MG PO TABS: 20.0000 mg | ORAL_TABLET | Freq: Every day | ORAL | 3 refills | Status: DC

## 2022-10-21 NOTE — Telephone Encounter (Signed)
90 day prescriptions sent to [facility] per pt's request.

## 2022-11-04 ENCOUNTER — Ambulatory Visit (HOSPITAL_COMMUNITY)
Admission: RE | Admit: 2022-11-04 | Discharge: 2022-11-04 | Disposition: A | Discharge status: Home / Self Care | Payer: BC Managed Care – PPO | Source: Ambulatory Visit | Attending: Physician Assistant | Admitting: Physician Assistant

## 2022-11-04 DIAGNOSIS — R001 Bradycardia, unspecified: Secondary | ICD-10-CM | POA: Diagnosis present

## 2022-11-04 DIAGNOSIS — I351 Nonrheumatic aortic (valve) insufficiency: Secondary | ICD-10-CM

## 2022-11-04 LAB — ECHOCARDIOGRAM COMPLETE
Area-P 1/2: 3.31 cm2
P 1/2 time: 810 msec
S' Lateral: 3.1 cm

## 2022-11-04 NOTE — Progress Notes (Signed)
*  PRELIMINARY RESULTS* Echocardiogram 2D Echocardiogram has been performed.  Debbie Allen 11/04/2022, 10:54 AM

## 2022-11-05 ENCOUNTER — Telehealth: Payer: Self-pay

## 2022-11-05 DIAGNOSIS — I495 Sick sinus syndrome: Secondary | ICD-10-CM

## 2022-11-05 DIAGNOSIS — I471 Supraventricular tachycardia, unspecified: Secondary | ICD-10-CM

## 2022-11-05 DIAGNOSIS — I351 Nonrheumatic aortic (valve) insufficiency: Secondary | ICD-10-CM

## 2022-11-05 NOTE — Telephone Encounter (Signed)
Patient returned CMA's call and stated can also leave her a detailed VM.

## 2022-11-05 NOTE — Telephone Encounter (Signed)
Patient notified via [facility]- active user. Labs entered.

## 2022-11-05 NOTE — Telephone Encounter (Signed)
-----   Message from Charlie Pitter, [place] sent at 11/04/2022  3:42 PM EST ----- Please let pt know echo shows normal pump function, mild stiffness of heart, and mild leaking of aortic valve. Mild valve leaking is stable from prior and something I would anticipate repeat echo 3-5 years. Overall no acute concerns. There is mention of increased flow velocities which can be due to various states including anemia or thyroid disruption or generally higher pump function. Though these values were normal earlier this month, I would recommend to bring in for repeat TSH with free T4, BMET, and hemoglobin/hematocrit level. If these are normal anticipate continuing followup as scheduled. Thank you.

## 2022-11-05 NOTE — Telephone Encounter (Signed)
Left a detailed message on pt's vm

## 2022-11-13 ENCOUNTER — Other Ambulatory Visit (HOSPITAL_COMMUNITY)
Admission: RE | Admit: 2022-11-13 | Discharge: 2022-11-13 | Disposition: A | Discharge status: Home / Self Care | Payer: BC Managed Care – PPO | Source: Ambulatory Visit | Attending: Physician Assistant | Admitting: Physician Assistant

## 2022-11-13 ENCOUNTER — Encounter: Payer: Self-pay | Admitting: Nurse Practitioner

## 2022-11-13 ENCOUNTER — Ambulatory Visit: Payer: BC Managed Care – PPO | Attending: Nurse Practitioner | Admitting: Nurse Practitioner

## 2022-11-13 ENCOUNTER — Telehealth: Payer: Self-pay

## 2022-11-13 VITALS — BP 114/74 | HR 76 | Ht 63.0 in | Wt 158.2 lb

## 2022-11-13 DIAGNOSIS — G4730 Sleep apnea, unspecified: Secondary | ICD-10-CM | POA: Diagnosis not present

## 2022-11-13 DIAGNOSIS — I471 Supraventricular tachycardia, unspecified: Secondary | ICD-10-CM | POA: Insufficient documentation

## 2022-11-13 DIAGNOSIS — E782 Mixed hyperlipidemia: Secondary | ICD-10-CM

## 2022-11-13 DIAGNOSIS — Z01810 Encounter for preprocedural cardiovascular examination: Secondary | ICD-10-CM | POA: Diagnosis not present

## 2022-11-13 DIAGNOSIS — Q245 Malformation of coronary vessels: Secondary | ICD-10-CM | POA: Diagnosis not present

## 2022-11-13 DIAGNOSIS — I351 Nonrheumatic aortic (valve) insufficiency: Secondary | ICD-10-CM | POA: Insufficient documentation

## 2022-11-13 DIAGNOSIS — E039 Hypothyroidism, unspecified: Secondary | ICD-10-CM

## 2022-11-13 DIAGNOSIS — R001 Bradycardia, unspecified: Secondary | ICD-10-CM

## 2022-11-13 DIAGNOSIS — I495 Sick sinus syndrome: Secondary | ICD-10-CM | POA: Insufficient documentation

## 2022-11-13 DIAGNOSIS — R002 Palpitations: Secondary | ICD-10-CM

## 2022-11-13 LAB — BASIC METABOLIC PANEL
Anion gap: 6 (ref 5–15)
BUN: 11 mg/dL (ref 6–20)
CO2: 27 mmol/L (ref 22–32)
Calcium: 8.7 mg/dL — ABNORMAL LOW (ref 8.9–10.3)
Chloride: 104 mmol/L (ref 98–111)
Creatinine, Ser: 0.84 mg/dL (ref 0.44–1.00)
GFR, Estimated: >60 mL/min (ref >60)
Glucose, Bld: 92 mg/dL (ref 70–99)
Potassium: 3.7 mmol/L (ref 3.5–5.1)
Sodium: 137 mmol/L (ref 135–145)

## 2022-11-13 LAB — HEMOGLOBIN AND HEMATOCRIT, BLOOD
HCT: 37.9 % (ref 36.0–46.0)
Hemoglobin: 12.6 g/dL (ref 12.0–15.0)

## 2022-11-13 LAB — TSH: TSH: 1.615 u[IU]/mL (ref 0.350–4.500)

## 2022-11-13 LAB — T4, FREE: Free T4: 0.97 ng/dL (ref 0.61–1.12)

## 2022-11-13 NOTE — Telephone Encounter (Signed)
Patient is aware of lab results. Verbalized understanding. No questions at this time.

## 2022-11-13 NOTE — Progress Notes (Signed)
Office Visit    Patient Name: Debbie Allen Date of Encounter: 11/13/2022  Primary Care Provider:  Celene Squibb, MD Primary Cardiologist:  Rozann Lesches, MD  Chief Complaint    57 y/o ? 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  Past Medical History    Past Medical History:  Diagnosis Date   Acid reflux    Allergy    Ankle fracture, left    Status post surgery and with neuropathy   Aortic insufficiency (mild)    a. 10/2022 Echo: EF 60-65%, no rwma, GrI DD, nl RV fxn, mild AI.   Aortic regurgitation    Asymptomatic   Arthritis    Asthma    Chest pain w/ cardiac cath 2008   a. 04/2007 Cath: LM nl, LAD nl, large septal w/ intramyocardial bridge - complete disappearance of contrast w/ systole. LCX nl, RCA anomalous origin, otw nl; b. 06/2014 MV: Nl EF. No ischemia or infarct.   Colon polyps    Concussion    Childhood   Hyperlipidemia    PSVT (paroxysmal supraventricular tachycardia)    Palpitations - suspected diagnosis (LGL postulated by Dr. Debara Pickett but never documented)   Sinus bradycardia    a. 10/2022 noted on home monitoring; b. 11/2022 Zio: predominantly sinus rhythm with average rate of 69 (46-126).  Rare PACs/PVCs (less than 1%).  Some PACs were nonconducted.  No significant arrhythmias or pauses.   Umbilical hernia    Past Surgical History:  Procedure Laterality Date   COLONOSCOPY  07/2019   DILATION AND CURETTAGE OF UTERUS     HARDWARE REMOVAL Left 08/29/2021   Procedure: HARDWARE REMOVAL LEFT ANKLE;  Surgeon: Nicholes Stairs, MD;  Location: [facility];  Service: Orthopedics;  Laterality: Left;   LEFT HEART CATH     ORIF ANKLE FRACTURE Left 05/11/2020   Procedure: OPEN REDUCTION INTERNAL FIXATION (ORIF) ANKLE FRACTURE;  Surgeon: Nicholes Stairs, MD;  Location: [facility];   Service: Orthopedics;  Laterality: Left;    Allergies  Allergies  Allergen Reactions   Codeine Itching    History of Present Illness    58 year old female with past medical history including chest pain, hyperlipidemia, mild aortic insufficiency, PSVT, short PR interval, sinus bradycardia, and asthma.  In the setting of chest pain, she underwent diagnostic catheterization 2008, she had a normal surgeon of the right coronary artery as well as an intramyocardial bridge and a large septal perforator, with complete disappearance of contrast during systole.  Coronary arteries are otherwise normal.  She is medically managed with beta-blocker and calcium channel blocker therapy.  Stress test in 2015 showed no evidence of ischemia or infarct with an EF of 51%.  Echo in August 2021 showed an EF of 55 to 60% with grade 2 diastolic dysfunction and mild AI.  This most recently seen in cardiology clinic on February 6 as part of a preop evaluation pending knee surgery.  She reported intermittent bradycardia and had captured heart rates in the 40s on her Kardia mobile device at home.  ECG that day showed sinus bradycardia at 59 with PAC and compensatory pause of 1.8 seconds.  Beta-blocker therapy was discontinued and she was continued on verapamil in the setting of prior history of PSVT.  On February 12, patient contacted our office reporting that she had heart rates into the  160s.  She was advised to resume metoprolol XL at 12.5 mg daily.  She was also referred to electrophysiology out of concern for tachybradycardia syndrome.  I was able to review the Kardia mobile strips from the day that she felt that she was having tachycardia (10/19/2022).  The strips show significant baseline artifact followed by sinus rhythm in the 70s.  Underlying sinus rhythm without tachycardia can be seen through the artifact.  Subsequent strips throughout the day, during times that she felt like she was tachycardic, continue to show sinus  rhythm in the 70s or 80s She placed her  ZIO XT the following day and this subsequently showed predominantly sinus rhythm with an average rate of 69 bpm (46-126), with rare PACs and PVCs, and some nonconducted PACs.  Echocardiogram was performed and showed an EF of 60 to 65% with grade 1 diastolic dysfunction and mild AI.  Since her last visit, she has continued to experience intermittent and brief episodes of palpitations and a sensation of a hard or full heartbeat.  She does not experience chest pain and denies dyspnea, PND, orthopnea, dizziness, syncope, edema, or early satiety.  Home Medications    Current Outpatient Medications  Medication Sig Dispense Refill   albuterol (PROVENTIL HFA;VENTOLIN HFA) 108 (90 BASE) MCG/ACT inhaler Inhale 2 puffs into the lungs every 6 (six) hours as needed for wheezing or shortness of breath.     Ascorbic Acid (VITAMIN C) 1000 MG tablet Take 1,000 mg by mouth daily.     aspirin EC 81 MG tablet Take 1 tablet (81 mg total) by mouth daily. 90 tablet 3   budesonide-formoterol (SYMBICORT) 160-4.5 MCG/ACT inhaler Inhale 2 puffs into the lungs daily as needed (asthma).     EPINEPHrine 0.3 mg/0.3 mL IJ SOAJ injection Inject 0.3 mg into the muscle as needed for anaphylaxis.     esomeprazole (NEXIUM) 20 MG capsule Take 20 mg by mouth daily.     ketotifen (ZADITOR) 0.025 % ophthalmic solution Place 1 drop into both eyes 2 (two) times daily as needed (allergies).     levocetirizine (XYZAL) 5 MG tablet Take 5 mg by mouth every evening.     levothyroxine (SYNTHROID) 25 MCG tablet Take 25 mcg by mouth daily before breakfast.     meclizine (ANTIVERT) 12.5 MG tablet Take 25 mg by mouth 3 (three) times daily as needed for dizziness.     MELATONIN PO Take 1 tablet by mouth at bedtime as needed (sleep).     metoprolol succinate (TOPROL XL) 25 MG 24 hr tablet Take 0.5 tablets (12.5 mg total) by mouth daily. 45 tablet 3   montelukast (SINGULAIR) 10 MG tablet Take 10 mg by mouth at  bedtime.     Multiple Vitamins-Minerals (MULTIVITAMIN WITH MINERALS) tablet Take 1 tablet by mouth daily.     Olopatadine HCl 0.6 % SOLN Place 1 spray into the nose in the morning and at bedtime.     potassium chloride SA (KLOR-CON M) 20 MEQ tablet TAKE 2 TABLETS BY MOUTH DAILY. 180 tablet 0   predniSONE (DELTASONE) 50 MG tablet Take 1 tablet (50 mg total) by mouth daily with breakfast. 5 tablet 0   rosuvastatin (CRESTOR) 20 MG tablet Take 1 tablet (20 mg total) by mouth daily. 90 tablet 3   verapamil (VERELAN PM) 240 MG 24 hr capsule Take 1 capsule (240 mg total) by mouth daily. 90 capsule 3   WEGOVY 2.4 MG/0.75ML SOAJ Inject 2.4 mg into the skin once a week.  HYDROcodone-acetaminophen (NORCO) 5-325 MG tablet Take 1 tablet by mouth every 6 (six) hours as needed for moderate pain. (Patient not taking: Reported on 11/13/2022) 30 tablet 0   Semaglutide,0.25 or 0.'5MG'$ /DOS, (OZEMPIC, 0.25 OR 0.5 MG/DOSE,) 2 MG/1.5ML SOPN Ozempic 0.25 mg or 0.5 mg (2 mg/1.5 mL) subcutaneous pen injector (Patient not taking: Reported on 11/13/2022)     No current facility-administered medications for this visit.     Review of Systems    Occasional palpitation.  She denies chest pain, dyspnea, PND, orthopnea, dizziness, syncope, edema, or early satiety.  All other systems reviewed and are otherwise negative except as noted above.    Physical Exam    VS:  BP 114/74   Pulse 76   Ht '5\' 3"'$  (1.6 m)   Wt 158 lb 3.2 oz (71.8 kg)   LMP 05/31/2014   SpO2 100%   BMI 28.02 kg/m  , BMI Body mass index is 28.02 kg/m. STOP-Bang Score:  4      GEN: Well nourished, well developed, in no acute distress. HEENT: normal. Neck: Supple, no JVD, carotid bruits, or masses. Cardiac: RRR, 1/6 diastolic murmur heard at the left lower sternal border.  No rubs or gallops.  No clubbing, cyanosis, edema.  Radials 2+/PT 2+ and equal bilaterally.  Respiratory:  Respirations regular and unlabored, clear to auscultation bilaterally. GI:  Soft, nontender, nondistended, BS + x 4. MS: no deformity or atrophy. Skin: warm and dry, no rash. Neuro:  Strength and sensation are intact. Psych: Normal affect.  Accessory Clinical Findings     Lab Results  Component Value Date   WBC 6.2 10/13/2022   HGB 12.6 11/13/2022   HCT 37.9 11/13/2022   MCV 86.3 10/13/2022   PLT 281 10/13/2022   Lab Results  Component Value Date   CREATININE 0.84 11/13/2022   BUN 11 11/13/2022   NA 137 11/13/2022   K 3.7 11/13/2022   CL 104 11/13/2022   CO2 27 11/13/2022   Lab Results  Component Value Date   ALT 93 (H) 05/13/2020   AST 27 05/13/2020   ALKPHOS 125 05/13/2020   BILITOT 0.5 05/13/2020   Lab Results  Component Value Date   CHOL 178 03/19/2020   HDL 52 03/19/2020   LDLCALC 67 03/19/2020   TRIG 252 (H) 04/30/2020   CHOLHDL 3.4 03/19/2020    Lab Results  Component Value Date   TSH 1.615 11/13/2022    Assessment & Plan    1.  Palpitations: Patient recently evaluated with report of intermittent isolated palpitations that feel like a hard heartbeat or full heartbeat associated with flushing.  She also showed strips from her Kardia mobile device that showed bradycardia with rates in the 40s.  She recently underwent event monitoring which showed rare PACs and PVCs (less than 1%) with some nonconducted PACs.  There were no significant arrhythmias or pauses.  She was noted to have nocturnal bradycardia (see below related to sleep disordered breathing).  Prior to placing her Zio, she called our office and reported tachycardia with rates in the 160s captured by her Kardia mobile device.  I reviewed that strip today and note that it is entirely artifact with underlying sinus rhythm and once the artifact dissipates, she is in sinus rhythm at 72 bpm.  She has other strips from the same day, where symptomatically, she felt as though she was tachycardic however, heart rates were in the 70s to 80s and in sinus rhythm throughout.  Reassurance  provided.  Continue  current doses of diltiazem and metoprolol.  2.  Bradycardia: As above, patient with bradycardia documented on her Kardia mobile device.  Following Zio monitoring, I see that she has PACs or PVCs with compensatory pauses, which likely explain her prior findings on her Kardia mobile, as she has not had any documentation of prolonged arrhythmias on that device, and no sustained arrhythmias or pauses were noted on Zio, with the exception of nocturnal bradycardia.  She does have a history of snoring and a STOP-BANG of 4, and she will be referred to pulmonology for sleep evaluation.  3.  Sleep disordered breathing: She does have a history of snoring and daytime sleepiness.  STOP-BANG is 4.  Nocturnal bradycardia noted on monitoring.  Referral for sleep evaluation.  4.  Intramyocardial bridge: Noted on diagnostic catheterization in August 2008 (intramyocardial bridge of the septal perforator).  She has not been having any chest pain or remains on beta-blocker and calcium channel blocker therapy.  5.  Preoperative cardiovascular examination: Patient is pending right knee surgery after her school year completes, likely in late May.  She does not experience chest pain or dyspnea.  Recent echo with normal LV function.  She is low risk for knee surgery and may proceed to the OR without additional ischemic evaluation at this time.  Recommend continuation of beta-blocker, calcium channel blocker, and statin throughout the perioperative period.  6.  Hyperlipidemia: LDL of 67 in July 2021.  This is followed by primary care provider.  She remains on statin therapy.  7.  Hypothyroidism: Normal TSH earlier today.  Free T4 is pending.  8.  Disposition: Follow-up in clinic in 3 to 4 months or sooner if necessary.  Murray Hodgkins, NP 11/13/2022, 5:17 PM

## 2022-11-13 NOTE — Patient Instructions (Signed)
Medication Instructions:  Your physician recommends that you continue on your current medications as directed. Please refer to the Current Medication list given to you today.  *If you need a refill on your cardiac medications before your next appointment, please call your pharmacy*   Lab Work: NONE   If you have labs (blood work) drawn today and your tests are completely normal, you will receive your results only by: [facility] (if you have MyChart) OR A paper copy in the mail If you have any lab test that is abnormal or we need to change your treatment, we will call you to review the results.   Testing/Procedures: NONE    Follow-Up: At [facility], you and your health needs are our priority.  As part of our continuing mission to provide you with exceptional heart care, we have created designated Provider Care Teams.  These Care Teams include your primary Cardiologist (physician) and Advanced Practice Providers (APPs -  Physician Assistants and Nurse Practitioners) who all work together to provide you with the care you need, when you need it.  We recommend signing up for the patient portal called "MyChart".  Sign up information is provided on this After Visit Summary.  MyChart is used to connect with patients for Virtual Visits (Telemedicine).  Patients are able to view lab/test results, encounter notes, upcoming appointments, etc.  Non-urgent messages can be sent to your provider as well.   To learn more about what you can do with MyChart, go to [email or web address].    Your next appointment:   4 month(s)  Provider:   You may see Rozann Lesches, MD or one of the following Advanced Practice Providers on your designated Care Team:   Bernerd Pho, PA-C  Ermalinda Barrios, PA-C     Other Instructions Thank you for choosing [facility]!

## 2022-11-13 NOTE — Telephone Encounter (Signed)
-----   Message from [place], [place] sent at 11/13/2022  2:56 PM EST ----- Covering for Dayna Dunn's inbox. Stable renal function and electrolyte. Normal hemoglobin level.

## 2022-11-23 ENCOUNTER — Institutional Professional Consult (permissible substitution): Payer: BC Managed Care – PPO | Admitting: Cardiovascular Disease

## 2022-11-24 ENCOUNTER — Telehealth: Payer: Self-pay | Admitting: Cardiology

## 2022-11-24 MED ORDER — VERAPAMIL HCL ER 240 MG PO CP24: 240.0000 mg | ORAL_CAPSULE | Freq: Every day | ORAL | 3 refills | Status: DC

## 2022-11-24 NOTE — Telephone Encounter (Signed)
Refill complete 

## 2022-11-24 NOTE — Telephone Encounter (Signed)
. *  STAT* If patient is at the pharmacy, call can be transferred to refill team.   1. Which medications need to be refilled? (please list name of each medication and dose if known) Verapamil  2. Which pharmacy/location (including street and city if local pharmacy) is medication to be sent to?[facility] [address], [place]  3. Do they need a 30 day or 90 day supply? 90 days and refills- please call today, completely out

## 2022-11-27 ENCOUNTER — Encounter (HOSPITAL_BASED_OUTPATIENT_CLINIC_OR_DEPARTMENT_OTHER): Payer: Self-pay

## 2022-12-07 ENCOUNTER — Institutional Professional Consult (permissible substitution) (HOSPITAL_BASED_OUTPATIENT_CLINIC_OR_DEPARTMENT_OTHER): Payer: BC Managed Care – PPO | Admitting: Pulmonary Disease

## 2022-12-07 ENCOUNTER — Other Ambulatory Visit: Payer: Self-pay | Admitting: Cardiology

## 2022-12-16 ENCOUNTER — Ambulatory Visit (INDEPENDENT_AMBULATORY_CARE_PROVIDER_SITE_OTHER): Payer: BC Managed Care – PPO | Admitting: Pulmonary Disease

## 2022-12-16 ENCOUNTER — Encounter (HOSPITAL_BASED_OUTPATIENT_CLINIC_OR_DEPARTMENT_OTHER): Payer: Self-pay | Admitting: Pulmonary Disease

## 2022-12-16 VITALS — BP 120/74 | HR 70 | Temp 98.0°F | Ht 62.0 in | Wt 156.6 lb

## 2022-12-16 DIAGNOSIS — G4730 Sleep apnea, unspecified: Secondary | ICD-10-CM | POA: Diagnosis not present

## 2022-12-16 NOTE — Patient Instructions (Signed)
X home sleep test °

## 2022-12-16 NOTE — Progress Notes (Signed)
Subjective:    Patient ID: Debbie Allen, female    DOB: 1965-11-14, 57 y.o.   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 She has asthma mild persistent and is maintained mainly on albuterol and she takes Symbicort when symptoms get worse. She has significant allergies and is maintained on allergy shots for the last 5 years and on Xyzal  PMH - AI  She underwent cardiac clearance for left knee surgery and was placed on event monitor due to complaints of palpitations.  This showed heart rate dropping as low as the 40s during sleep.  She is maintained on verapamil and.  She was referred to rule out sleep apnea as a cause. She also reports witnessed apneas during hospital stay in the past. Sleepiness score is 4 and she reports nonrefreshing sleep.  Aspirin is ordered every and restless sleep.  She reports excessive tiredness during the daytime a.m., sleep latency can be 30 minutes to about 2 hours, she starts of sleeping on her right side but rolls over on her back reports several nocturnal awakenings and is out of bed by 6:15 PM feeling tired with dryness of mouth and occasional headaches. She has lost 30 pounds over the last year  There is no history suggestive of cataplexy, sleep paralysis or parasomnias  Cardiology consultation was reviewed  Past Medical History:  Diagnosis Date   Acid reflux    Allergy    Ankle fracture, left    Status post surgery and with neuropathy   Aortic insufficiency (mild)    a. 10/2022 Echo: EF 60-65%, no rwma, GrI DD, nl RV fxn, mild AI.   Aortic regurgitation    Asymptomatic   Arthritis    Asthma    Chest pain w/ cardiac cath 2008   a. 04/2007 Cath: LM nl, LAD nl, large septal w/ intramyocardial bridge - complete disappearance of contrast w/ systole. LCX nl, RCA anomalous origin, otw nl; b. 06/2014 MV: Nl EF. No ischemia or infarct.   Colon polyps    Concussion     Childhood   Hyperlipidemia    PSVT (paroxysmal supraventricular tachycardia)    Palpitations - suspected diagnosis (LGL postulated by Dr. Rennis Golden but never documented)   Sinus bradycardia    a. 10/2022 noted on home monitoring; b. 11/2022 Zio: predominantly sinus rhythm with average rate of 69 (46-126).  Rare PACs/PVCs (less than 1%).  Some PACs were nonconducted.  No significant arrhythmias or pauses.   Umbilical hernia    Past Surgical History:  Procedure Laterality Date   COLONOSCOPY  07/2019   DILATION AND CURETTAGE OF UTERUS     HARDWARE REMOVAL Left 08/29/2021   Procedure: HARDWARE REMOVAL LEFT ANKLE;  Surgeon: Yolonda Kida, MD;  Location: [facility] OR;  Service: Orthopedics;  Laterality: Left;   LEFT HEART CATH     ORIF ANKLE FRACTURE Left 05/11/2020   Procedure: OPEN REDUCTION INTERNAL FIXATION (ORIF) ANKLE FRACTURE;  Surgeon: Yolonda Kida, MD;  Location: [facility] OR;  Service: Orthopedics;  Laterality: Left;    Allergies  Allergen Reactions   Codeine Itching    Social History   Socioeconomic History   Marital status: Married    Spouse name: Not on file   Number of children: 2   Years of education: Not on file   Highest education level: Not on file  Occupational History   Occupation: retired Runner, broadcasting/film/video  Tobacco Use   Smoking status: Never  Smokeless tobacco: Never  Vaping Use   Vaping Use: Never used  Substance and Sexual Activity   Alcohol use: Not Currently    Comment: Rarely   Drug use: No   Sexual activity: Not on file  Other Topics Concern   Not on file  Social History Narrative   Not on file   Social Determinants of Health   Financial Resource Strain: Not on file  Food Insecurity: Not on file  Transportation Needs: Not on file  Physical Activity: Not on file  Stress: Not on file  Social Connections: Not on file  Intimate Partner Violence: Not on file    Family History  Problem Relation Age of Onset   Heart disease Father 37   Hyperlipidemia  Father    Colon polyps Father        benign   Hyperlipidemia Sister    Hypertension Paternal Grandmother    Heart disease Paternal Grandfather    Heart attack Paternal Grandfather    Colon cancer Neg Hx    Esophageal cancer Neg Hx    Rectal cancer Neg Hx    Stomach cancer Neg Hx      Review of Systems Constitutional: negative for anorexia, fevers and sweats  Eyes: negative for irritation, redness and visual disturbance  Ears, nose, mouth, throat, and face: negative for earaches, epistaxis,  and sore throat  Respiratory: negative for cough, dyspnea on exertion, sputum and wheezing  Cardiovascular: negative for chest pain, dyspnea, lower extremity edema, orthopnea, and syncope  Gastrointestinal: negative for abdominal pain, constipation, diarrhea, melena, nausea and vomiting  Genitourinary:negative for dysuria, frequency and hematuria  Hematologic/lymphatic: negative for bleeding, easy bruising and lymphadenopathy  Musculoskeletal:negative for arthralgias, muscle weakness  Neurological: negative for coordination problems, gait problems, headaches and weakness  Endocrine: negative for diabetic symptoms including polydipsia, polyuria and weight loss     Objective:   Physical Exam   Gen. Pleasant, obese, in no distress, normal affect ENT - no pallor,icterus, no post nasal drip, class 2 airway Neck: No JVD, no thyromegaly, no carotid bruits Lungs: no use of accessory muscles, no dullness to percussion, decreased without rales or rhonchi  Cardiovascular: Rhythm regular, heart sounds  normal, ESM 2/6 at base no peripheral edema Abdomen: soft and non-tender, no hepatosplenomegaly, BS normal. Musculoskeletal: No deformities, no cyanosis or clubbing Neuro:  alert, non focal, no tremors        Assessment & Plan:    Nocturnal bradycardia Sleep disordered breathing  -Given excessive daytime tiredness, nonrefreshing sleep, narrow pharyngeal exam, & loud snoring, obstructive sleep  apnea is possible & an overnight polysomnogram will be scheduled as a home study. The pathophysiology of obstructive sleep apnea , it's cardiovascular consequences & modes of treatment including CPAP were discused with the patient in detail & they evidenced understanding.  Pretest probability is intermediate.  There is natural drop in heart rate during sleep.  She is also on verapamil and Toprol which can account for bradycardia

## 2023-01-12 ENCOUNTER — Telehealth: Payer: Self-pay | Admitting: Pulmonary Disease

## 2023-01-12 ENCOUNTER — Encounter (INDEPENDENT_AMBULATORY_CARE_PROVIDER_SITE_OTHER): Payer: BC Managed Care – PPO

## 2023-01-12 DIAGNOSIS — G4730 Sleep apnea, unspecified: Secondary | ICD-10-CM

## 2023-01-12 DIAGNOSIS — G4733 Obstructive sleep apnea (adult) (pediatric): Secondary | ICD-10-CM

## 2023-01-12 NOTE — Telephone Encounter (Signed)
HST showed mild OSA with AHI 12/ hr but oxygen drop was severe at 77% For mild degree we can observe with weight loss alone but since oxygen drop was significant, would suggest treatment  Suggest autoCPAP  5-15 cm, mask of choice OV with me in 6 -8 wks after starting

## 2023-01-18 NOTE — Telephone Encounter (Signed)
ATC pt LVM for her to call back 

## 2023-01-26 NOTE — Telephone Encounter (Signed)
Pt. Called back from missing our call about her sleep study

## 2023-01-26 NOTE — Addendum Note (Signed)
Addended by: Arlyss Repress on: 01/26/2023 09:16 AM   Modules accepted: Orders

## 2023-01-26 NOTE — Telephone Encounter (Signed)
Called and spoke with patient. She verbalized understanding. Order for CPAP has been placed.   Nothing further needed.

## 2023-02-15 ENCOUNTER — Other Ambulatory Visit: Payer: Self-pay | Admitting: Cardiology

## 2023-03-26 ENCOUNTER — Ambulatory Visit: Payer: BC Managed Care – PPO | Admitting: Cardiology

## 2023-05-21 ENCOUNTER — Other Ambulatory Visit: Payer: Self-pay | Admitting: Cardiology

## 2023-05-25 ENCOUNTER — Ambulatory Visit (HOSPITAL_BASED_OUTPATIENT_CLINIC_OR_DEPARTMENT_OTHER): Payer: BC Managed Care – PPO | Admitting: Pulmonary Disease

## 2023-05-25 ENCOUNTER — Encounter (HOSPITAL_BASED_OUTPATIENT_CLINIC_OR_DEPARTMENT_OTHER): Payer: Self-pay | Admitting: Pulmonary Disease

## 2023-05-25 VITALS — BP 122/70 | HR 58 | Ht 62.0 in | Wt 157.0 lb

## 2023-05-25 DIAGNOSIS — G4733 Obstructive sleep apnea (adult) (pediatric): Secondary | ICD-10-CM | POA: Diagnosis not present

## 2023-05-25 NOTE — Assessment & Plan Note (Signed)
CPAP download was reviewed which shows excellent control of events on auto settings 5 to 15 cm with average pressure of 10 and max pressure of 11 cm.  Will change her to auto settings 8 to 12 cm. Compliance is good with average usage around 6 hours with a few missed nights.  Finding good fitting mask seems to be the main issue.  I offered her mask fitting session, she would like to wait and see how she does with the new mask that she has ordered but the hose comes from the top.  Weight loss encouraged, compliance with goal of at least 4-6 hrs every night is the expectation. Advised against medications with sedative side effects Cautioned against driving when sleepy - understanding that sleepiness will vary on a day to day basis

## 2023-05-25 NOTE — Progress Notes (Signed)
Subjective:    Patient ID: Debbie Allen, female    DOB: 12/16/1965, 57 y.o.   MRN: [id number]  HPI  57 yo for FU of OSA & nocturnal bradycardia  Mild persistent asthma- on albuterol and takes Symbicort as needed She has significant allergies and is maintained on allergy shots for the last 5 years and on Xyzal   PMH - AI  She was started on CPAP therapy after home sleep test showed mild sleep apnea since she was symptomatic.  She reports being a poor sleeper, she has been struggling somewhat with her CPAP mask and this seems to interrupt her sleep she decreased her usage when school started in August and is now trying to get back on track. She denies any problems with pressure She is using nasal pillows and trying to obtain a mask where the hose comes from the top  Significant tests/ events reviewed  HST 12/2022 AHI 12/hour, low sat 77%, 7 minutes with saturation less than 88%  Review of Systems neg for any significant sore throat, dysphagia, itching, sneezing, nasal congestion or excess/ purulent secretions, fever, chills, sweats, unintended wt loss, pleuritic or exertional cp, hempoptysis, orthopnea pnd or change in chronic leg swelling. Also denies presyncope, palpitations, heartburn, abdominal pain, nausea, vomiting, diarrhea or change in bowel or urinary habits, dysuria,hematuria, rash, arthralgias, visual complaints, headache, numbness weakness or ataxia.     Objective:   Physical Exam  Gen. Pleasant, obese, in no distress ENT - no lesions, no post nasal drip Neck: No JVD, no thyromegaly, no carotid bruits Lungs: no use of accessory muscles, no dullness to percussion, decreased without rales or rhonchi  Cardiovascular: Rhythm regular, heart sounds  normal, no murmurs or gallops, no peripheral edema Musculoskeletal: No deformities, no cyanosis or clubbing , no tremors       Assessment & Plan:

## 2023-05-25 NOTE — Patient Instructions (Signed)
Call us for mask fitting appt if you are not satisfied with mask change

## 2023-06-10 ENCOUNTER — Ambulatory Visit (INDEPENDENT_AMBULATORY_CARE_PROVIDER_SITE_OTHER): Payer: BC Managed Care – PPO | Admitting: Otolaryngology

## 2023-06-16 ENCOUNTER — Ambulatory Visit: Payer: BC Managed Care – PPO | Attending: Cardiology | Admitting: Cardiology

## 2023-06-16 ENCOUNTER — Encounter: Payer: Self-pay | Admitting: Cardiology

## 2023-06-16 VITALS — BP 112/76 | HR 56 | Ht 63.0 in | Wt 157.4 lb

## 2023-06-16 DIAGNOSIS — E782 Mixed hyperlipidemia: Secondary | ICD-10-CM | POA: Diagnosis not present

## 2023-06-16 DIAGNOSIS — G4733 Obstructive sleep apnea (adult) (pediatric): Secondary | ICD-10-CM

## 2023-06-16 DIAGNOSIS — R002 Palpitations: Secondary | ICD-10-CM

## 2023-06-16 NOTE — Progress Notes (Signed)
Cardiology Office Note  Date: 06/16/2023   ID: SURABHI PIGNOTTI, DOB 1966-03-29, MRN [id number]  History of Present Illness: Debbie Allen is a 57 y.o. female last seen in March by Mr. Brion Aliment NP, I reviewed the note (I last saw her in November 2022).  She is here for a routine visit.  States that she actually feels fairly well, no significant dyspnea or functional limitation, no rapid palpitations, no dizziness or syncope.  She is now on CPAP for treatment of OSA and tolerating this well.  I reviewed her medications.  Current cardiac regimen includes aspirin, Toprol-XL, Crestor, and verapamil.  Resting heart rate is in the 50s today.  She does not seem to be bothered by symptomatic bradycardia however.  It sounds like this was an issue previously especially when she was exercising.  I have asked her to check her heart rate when she is exercising to make sure that she has an adequate response.  Would be reasonable to consider stopping Toprol-XL since she is already on a reasonable dose of verapamil.  Physical Exam: VS:  BP 112/76 (BP Location: Left Arm, Patient Position: Sitting, Cuff Size: Normal)   Pulse (!) 56   Ht 5\' 3"  (1.6 m)   Wt 157 lb 6.4 oz (71.4 kg)   LMP 05/31/2014   SpO2 99%   BMI 27.88 kg/m , BMI Body mass index is 27.88 kg/m.  Wt Readings from Last 3 Encounters:  06/16/23 157 lb 6.4 oz (71.4 kg)  05/25/23 157 lb (71.2 kg)  12/16/22 156 lb 9.6 oz (71 kg)    General: Patient appears comfortable at rest. HEENT: Conjunctiva and lids normal. Neck: Supple, no elevated JVP or carotid bruits. Lungs: Clear to auscultation, nonlabored breathing at rest. Cardiac: Regular rate and rhythm, no S3 or significant systolic murmur. Extremities: No pitting edema.  ECG:  An ECG dated 10/13/2022 was personally reviewed today and demonstrated:  Sinus rhythm with probable blocked PAC.  Labwork: 10/13/2022: Magnesium 2.1; Platelets 281 11/13/2022: BUN 11; Creatinine, Ser 0.84; Hemoglobin 12.6;  Potassium 3.7; Sodium 137; TSH 1.615  July 2024: TSH 3.44, hemoglobin 12, platelets 284, BUN 16, creatinine 0.7, potassium 4.4, AST 22, ALT 18, cholesterol 174, triglycerides 144, HDL 65, LDL 84, hemoglobin A1c 5.5%  Other Studies Reviewed Today:  Echocardiogram 11/04/2022:  1. Left ventricular ejection fraction, by estimation, is 60 to 65%. The  left ventricle has normal function. The left ventricle has no regional  wall motion abnormalities. Left ventricular diastolic parameters are  consistent with Grade I diastolic  dysfunction (impaired relaxation).   2. Right ventricular systolic function is normal. The right ventricular  size is normal. Tricuspid regurgitation signal is inadequate for assessing  PA pressure.   3. The mitral valve is normal in structure. No evidence of mitral valve  regurgitation. No evidence of mitral stenosis.   4. The aortic valve is tricuspid. Aortic valve regurgitation is mild. No  aortic stenosis is present.   5. The inferior vena cava is normal in size with greater than 50%  respiratory variability, suggesting right atrial pressure of 3 mmHg.   6. Increased flow velocities may be secondary to anemia, thyrotoxicosis,  hyperdynamic or high flow state.   Cardiac monitor March 2024: ZIO XT reviewed.  6 days, 19 hours analyzed.   Predominant rhythm is sinus with heart rate ranging from 46 bpm up to 126 bpm and average heart rate 69 bpm. There were rare PACs representing less than 1% total beats.  Some PACs were nonconducted. There were rare PVCs representing less than 1% total beats. No significant arrhythmias or pauses.  Assessment and Plan:  1.  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  She is asymptomatic at this time.  Continues on verapamil and aspirin.  2.  Mixed hyperlipidemia.  She is on Crestor, LDL 84 in July.  3.  Palpitations by history.  Last cardiac monitor in March showed sinus rhythm  with rare PACs some of which were nonconducted, also rare PVCs.  There were no sustained arrhythmias or pauses at that time.  In addition to verapamil above she is also on low-dose Toprol-XL.  I have asked her to check heart rate with activity, could consider stopping Toprol-XL given her resting heart rates.  4.  OSA tolerating CPAP.  Disposition:  Follow up  6 months.  Signed, Jonelle Sidle, M.D., F.A.C.C. [facility]

## 2023-06-16 NOTE — Patient Instructions (Signed)
Medication Instructions:  Your physician recommends that you continue on your current medications as directed. Please refer to the Current Medication list given to you today.   Labwork: None today  Testing/Procedures: None today  Follow-Up: 6 months  Any Other Special Instructions Will Be Listed Below (If Applicable).  If you need a refill on your cardiac medications before your next appointment, please call your pharmacy.  

## 2023-08-26 ENCOUNTER — Ambulatory Visit (INDEPENDENT_AMBULATORY_CARE_PROVIDER_SITE_OTHER): Payer: BC Managed Care – PPO | Admitting: Audiology

## 2023-08-26 DIAGNOSIS — R42 Dizziness and giddiness: Secondary | ICD-10-CM

## 2023-08-26 NOTE — Progress Notes (Addendum)
[address], [address] [place], [place] [address] [phone]  Vestibular Evaluation    Name: Debbie Allen     DOB:   14-Feb-1966      MRN:   [id number]                                                                                     Service Date: 08/26/2023       Case History:  Patient is a 57 year old female who was seen for a vestibular assessment as ordered by Dr. Janeece Riggers Philomena Doheny.   Patient reported a sensation of dizziness that started when she was teenager, with the most recent episode occurring a couple weeks ago. She described her dizziness as a spinning sensation, swimming sensation, imbalance, near-faint sensation, lightheadedness, drunkenness, and motion sickness. Associated symptoms include bilateral aural fullness, migraine headache with light and sound sensitivity, nausea, and syncope. Her intermittent episodes reportedly last for seconds and occur monthly.   Exacerbating factors include standing, stress, when in a car, loud noises, and bright lights. She feels better when sitting down. Patient noted that nothing unusual happens when she rolls over in bed. She denied taking any relevant medications within the past 48 hours.   Additional medical history pertinent to the vestibular assessment includes left mild to moderate mixed hearing loss from [phone] Hz with good word understanding, right normal hearing sensitivity from [phone] Hz sloping to moderately-severe hearing loss from 6000-8000 Hz with good word understanding, bilateral hyper-compliant tympanograms, irregular heartbeat, head injury at age 52, asthma, falls, arthritis in her hands and back, thyroid disorder, back issues, polypharmacy, and allergies.  Patient also reported frequent syncope episodes with the last one occurring 3 years ago where she broke her left leg as a result.   Biomedical scientist (SOP) Test: Romberg (Conditions 1-2): WNL  Tandem Romberg (Conditions 3-4): WNL  CTSIB  (Conditions 5-6): Sway Fukuda (Condition 7): WNL    Vertebral Artery Screening Test (VAST): VAST Right: Negative VAST Left: Negative   Electrophysiology: Auditory Brainstem Response (ABR): WNL AU  Electrocochleography (ECochG): Negative AU Cervical Vestibular-Evoked Myogenic Potential (cVEMP): Reduced AS; Amplitude Asymmetry; Present AD   Videonystagmography (VNG): Ocular Motility Tests Gaze: WNL  Smooth Pursuit: Low Gain  Saccades: WNL  OPK: WNL    Spontaneous Nystagmus Sitting: None Supine: Sub-clinical 1 deg/s left-beating nystagmus that fixated using a visual target Head Shake (Active): None   Positional Tests Head Right: WNL Head Left: Square Wave Jerks Body Right: DNT Body Left: DNT    Positioning Tests Head Hanging Right: Square Wave Jerks Head Hanging Left: WNL Dix-Hallpike/Side Lying Right: Negative for BPPV Dix-Hallpike/Side Lying Left: Negative for BPPV   Bithermal Air Calorics Unilateral Weakness: 7% (<25% WNL), Left Directional Preponderance: 12% (<30% WNL), Right Fixation Index: Normal   Rotary Chair:  Sinusoidal Harmonic Acceleration (SHA)  Frequencies Tested: 0.01, 0.02, 0.04, 0.08, 0.64 Hz  Gain: WNL Phase: Phase Lag from 0.01-0.04 Hz; WNL for all other tested frequencies  Symmetry: WNL    Step Velocity Test (SVT)  Gain: WNL  Time Constant: WNL  Time Constant Asymmetry: WNL     Conclusions:   Abnormal vestibular assessment with central  indicators.    Abnormal [facility] Test results suggest an imbalance between visual, somatosensory, and vestibular inputs for dynamic postural control. Vertebral Artery Screening Test was negative, bilaterally, for vertebral artery insufficiency. Normal ABR results suggest good function of the cochlear nerve in both ears. Normal ECochG results do not suggest elevated labyrinthine pressure, or endolymphatic hydrops, in either ear. Abnormal cVEMP results in the left ear may be attributed to poor function of the  saccule and/or inferior vestibular nerve in the left ear, a normal age-related variant associated with low muscle tone at the recording site, and/or history of neck issues. Normal cVEMP results in the right ear suggest intact function of the saccule and inferior vestibular nerve in the right ear. Essentially normal ocular motility test results. No clinically significant nystagmus or BPPV observed during positional and positioning tests. Normal head shake test results do not suggest an imbalance of vestibular inputs. Consistent square wave jerks suggest central involvement. Normal bithermal air calorics results do not suggest a unilateral weakness or bilateral weakness affecting the lateral semicircular canal and/or superior vestibular nerve in either ear. Abnormal phase lag for [facility] suggests central involvement. Normal SVT results suggest an intact velocity storage mechanism.     Recommendations: Follow-up appointment with Dr. Janeece Riggers Philomena Doheny at [facility] for further discussion and recommendations. Repeat testing as medically necessary or should further concerns arise.     Audiologist: Conley Rolls Sarvesh Meddaugh, AUD, CCC-A  Date of Service: 08/26/2023

## 2023-09-02 ENCOUNTER — Encounter (INDEPENDENT_AMBULATORY_CARE_PROVIDER_SITE_OTHER): Payer: Self-pay

## 2023-09-02 ENCOUNTER — Ambulatory Visit (INDEPENDENT_AMBULATORY_CARE_PROVIDER_SITE_OTHER): Payer: BC Managed Care – PPO | Admitting: Otolaryngology

## 2023-09-02 VITALS — BP 117/75 | HR 73 | Ht 63.0 in | Wt 150.0 lb

## 2023-09-02 DIAGNOSIS — J343 Hypertrophy of nasal turbinates: Secondary | ICD-10-CM

## 2023-09-02 DIAGNOSIS — J342 Deviated nasal septum: Secondary | ICD-10-CM | POA: Diagnosis not present

## 2023-09-02 DIAGNOSIS — J329 Chronic sinusitis, unspecified: Secondary | ICD-10-CM

## 2023-09-02 DIAGNOSIS — R42 Dizziness and giddiness: Secondary | ICD-10-CM | POA: Diagnosis not present

## 2023-09-02 DIAGNOSIS — J0101 Acute recurrent maxillary sinusitis: Secondary | ICD-10-CM

## 2023-09-02 DIAGNOSIS — H9042 Sensorineural hearing loss, unilateral, left ear, with unrestricted hearing on the contralateral side: Secondary | ICD-10-CM

## 2023-09-02 MED ORDER — AMOXICILLIN-POT CLAVULANATE 875-125 MG PO TABS: 1.0000 | ORAL_TABLET | Freq: Two times a day (BID) | ORAL | 0 refills | Status: AC

## 2023-09-05 DIAGNOSIS — J343 Hypertrophy of nasal turbinates: Secondary | ICD-10-CM | POA: Insufficient documentation

## 2023-09-05 DIAGNOSIS — J342 Deviated nasal septum: Secondary | ICD-10-CM | POA: Insufficient documentation

## 2023-09-05 DIAGNOSIS — H9042 Sensorineural hearing loss, unilateral, left ear, with unrestricted hearing on the contralateral side: Secondary | ICD-10-CM | POA: Insufficient documentation

## 2023-09-05 DIAGNOSIS — J0101 Acute recurrent maxillary sinusitis: Secondary | ICD-10-CM | POA: Insufficient documentation

## 2023-09-05 DIAGNOSIS — R42 Dizziness and giddiness: Secondary | ICD-10-CM | POA: Insufficient documentation

## 2023-09-05 NOTE — Progress Notes (Signed)
Patient ID: Debbie Allen, female   DOB: 1966/02/13, 57 y.o.   MRN: [id number]  Follow-up: Recurrent dizziness, hearing loss New complaint: Recurrent sinusitis  HPI: The patient is a 57 year old female who presents today with a new complaint of recurrent sinusitis.  She was previously seen for recurrent dizziness and hearing loss.  The patient was previously noted to have bilateral high-frequency sensorineural hearing loss, worse on the left side.  Her MRI scan was negative for retrocochlear lesion.  She was fitted with a left ear hearing aid.  At her last visit in August 2024, she was also complaining of recurrent dizziness.  The dizziness was described as a spinning vertigo that lasts from seconds to minutes.  She also has frequent fullness in her head.  She subsequently underwent a vestibular neurodiagnostic testing.  The findings showed no significant peripheral vestibular weakness.  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  The patient returns today reporting intermittent dizziness.  The severity has decreased.  She has a new complaint today of recurrent sinusitis.  She complains of frequent facial pain and pressure, postnasal drainage, and frequent coughing spells.  She was treated in October and November of this year.  She is still symptomatic.  She has a history of environmental allergies.  She is currently on Xyzal, olopatadine, and Singulair.  Currently she denies any otorrhea, otalgia, fever, visual change, recent hearing loss, or vertigo.  Exam: General: Communicates without difficulty, well nourished, no acute distress. Head: Normocephalic, no evidence injury, no tenderness, facial buttresses intact without stepoff. Face/sinus: No tenderness to palpation and percussion. Facial movement is normal and symmetric. Eyes: PERRL, EOMI. No scleral icterus, conjunctivae clear. Neuro: CN II exam reveals vision grossly intact.  No nystagmus at any point of gaze. Ears: Auricles well formed  without lesions.  Ear canals are intact without mass or lesion.  No erythema or edema is appreciated.  The TMs are intact without fluid. Nose: External evaluation reveals normal support and skin without lesions.  Dorsum is intact.  Anterior rhinoscopy reveals congested mucosa over anterior aspect of inferior turbinates and intact septum.  No purulence noted. Oral:  Oral cavity and oropharynx are intact, symmetric, without erythema or edema.  Mucosa is moist without lesions. Neck: Full range of motion without pain.  There is no significant lymphadenopathy.  No masses palpable.  Thyroid bed within normal limits to palpation.  Parotid glands and submandibular glands equal bilaterally without mass.  Trachea is midline. Neuro:  CN 2-12 grossly intact. Vestibular: No nystagmus at any point of gaze.  Vestibular: There is no nystagmus with pneumatic pressure on either tympanic membrane or Valsalva. The cerebellar examination is unremarkable.   Procedure:  Flexible Nasal Endoscopy: Description: Risks, benefits, and alternatives of flexible endoscopy were explained to the patient.  Specific mention was made of the risk of throat numbness with difficulty swallowing, possible bleeding from the nose and mouth, and pain from the procedure.  The patient gave oral consent to proceed.  The flexible scope was inserted into the right nasal cavity.  Endoscopy of the interior nasal cavity, superior, inferior, and middle meatus was performed. The sphenoid-ethmoid recess was examined. Edematous and erythematous mucosa was noted.  No polyp, mass, or lesion was appreciated. Nasal septal deviation noted. Olfactory cleft was clear.  Nasopharynx was clear.  Turbinates were hypertrophied but without mass.  The procedure was repeated on the contralateral side with similar findings.  The patient tolerated the procedure well.    Assessment: 1.  Recurrent dizziness, secondary to central vestibular dysfunction.  No significant peripheral  vestibular weakness was noted. 2.  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  Her previous MRI scan was negative for retrocochlear lesion. 3.  Recurrent rhinosinusitis.  Erythematous and edematous nasal mucosa is noted today. 4.  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  Plan: 1.  The physical exam and nasal endoscopy findings are reviewed with the patient.  The vestibular neurodiagnostic testing results are also extensively reviewed. 2.  The pathophysiology of vestibular dysfunction and dizziness are discussed extensively with the patient. Questions are invited and answered.   3.  The patient will likely benefit from undergoing physical therapy/vestibular rehabilitation to improve the balancing function.  However, the patient defers on any physical therapy at this time. 4.  Augmentin 875 mg p.o. twice daily for 10 days to treat her recurrent acute sinusitis. 5.  Flonase nasal spray 2 sprays each nostril daily. 6.  The patient should continue with her current allergy treatment regimen. 7.  The patient will return for reevaluation in 2 weeks if she continues to be symptomatic.  She is encouraged to call with any questions or concerns.

## 2023-11-19 ENCOUNTER — Other Ambulatory Visit: Payer: Self-pay | Admitting: Cardiology

## 2023-12-20 ENCOUNTER — Other Ambulatory Visit (HOSPITAL_COMMUNITY): Payer: Self-pay | Admitting: Nurse Practitioner

## 2023-12-20 DIAGNOSIS — Z1231 Encounter for screening mammogram for malignant neoplasm of breast: Secondary | ICD-10-CM

## 2023-12-20 DIAGNOSIS — M8580 Other specified disorders of bone density and structure, unspecified site: Secondary | ICD-10-CM

## 2024-01-01 ENCOUNTER — Other Ambulatory Visit: Payer: Self-pay | Admitting: Cardiology

## 2024-01-01 ENCOUNTER — Other Ambulatory Visit: Payer: Self-pay | Admitting: Physician Assistant

## 2024-01-04 NOTE — Telephone Encounter (Signed)
 This is a [place] pt seen by Dr. Londa Rival.

## 2024-01-20 ENCOUNTER — Ambulatory Visit (INDEPENDENT_AMBULATORY_CARE_PROVIDER_SITE_OTHER): Payer: Self-pay | Admitting: Audiology

## 2024-01-20 DIAGNOSIS — H9190 Unspecified hearing loss, unspecified ear: Secondary | ICD-10-CM

## 2024-01-20 NOTE — Progress Notes (Signed)
  [address], [address] [place], [place] [address] [phone]  Hearing Aid Check     Dlisa S Heidt comes for a scheduled appointment for a hearing aid check.    Accompanied HQ:[id number]    Left  Hearing aid manufacturer Phonak Gerlean Kocher [place] SN: [id number]  Hearing aid style Receiver in the canal  Hearing aid battery rechargeable  Receiver 5M  Dome/ custom earpiece [place]inbach vented dome  Retention wire yes  Warranty expiration date 03-05-2022  Loss and Damage expired  Additional accessories Expiration date   Initial fitting date 12-18-19  Device was fit at: [facility]    Chief complaint: Patient reports the hearing aid will sometime snot charge. She suspects intermittent charger function.  Actions taken: Provided the patient with a charger we had in stock. She was recommended to use a different outlet at home and to avoid storing it in a humid/moist room ( for example: bathrooms)  Services fee: $30 was paid at checkout.    Recommend: Return for a hearing aid check , as needed. Return for a hearing evaluation and to see an ENT, if concerns with hearing changes arise.    Merrill Villarruel MARIE LEROUX-MARTINEZ, AUD

## 2024-02-14 ENCOUNTER — Other Ambulatory Visit: Payer: Self-pay | Admitting: Cardiology

## 2024-02-22 ENCOUNTER — Ambulatory Visit (HOSPITAL_COMMUNITY)
Admission: RE | Admit: 2024-02-22 | Discharge: 2024-02-22 | Disposition: A | Discharge status: Home / Self Care | Source: Ambulatory Visit | Attending: Nurse Practitioner | Admitting: Nurse Practitioner

## 2024-02-22 ENCOUNTER — Telehealth: Payer: Self-pay | Admitting: Cardiology

## 2024-02-22 DIAGNOSIS — M8580 Other specified disorders of bone density and structure, unspecified site: Secondary | ICD-10-CM | POA: Diagnosis present

## 2024-02-22 DIAGNOSIS — Z1231 Encounter for screening mammogram for malignant neoplasm of breast: Secondary | ICD-10-CM | POA: Insufficient documentation

## 2024-02-22 DIAGNOSIS — I471 Supraventricular tachycardia, unspecified: Secondary | ICD-10-CM

## 2024-02-22 MED ORDER — VERAPAMIL HCL ER 240 MG PO TBCR: 240.0000 mg | EXTENDED_RELEASE_TABLET | Freq: Every day | ORAL | 5 refills | Status: AC

## 2024-02-22 NOTE — Telephone Encounter (Signed)
 Pt c/o medication issue:  1. Name of Medication:   verapamil  (VERELAN ) 240 MG 24 hr capsule   2. How are you currently taking this medication (dosage and times per day)?   As prescribed  3. Are you having a reaction (difficulty breathing--STAT)?   No  4. What is your medication issue?   Patient stated her insurance may not be covering this medication and it is too expensive for her.  Patient wants to get alternate medication today as she leaves to go out of town tomorrow. Patient stated she is completely out of this medication.

## 2024-02-22 NOTE — Telephone Encounter (Signed)
 Changing manufacturer of verapamil  240mg 

## 2024-02-22 NOTE — Telephone Encounter (Signed)
 I will ask Pharm-D about this. I am not aware that verapamil  is expensive.

## 2024-02-28 ENCOUNTER — Telehealth: Payer: Self-pay | Admitting: Pharmacy Technician

## 2024-02-28 NOTE — Telephone Encounter (Signed)
 Received cmm for verapamil  capsules. Patient was changed to tablets on 02/22/24. I called [facility] and patient picked up tablets for 3.62

## 2024-02-29 ENCOUNTER — Ambulatory Visit (INDEPENDENT_AMBULATORY_CARE_PROVIDER_SITE_OTHER): Payer: Self-pay | Admitting: Otolaryngology

## 2024-02-29 ENCOUNTER — Encounter (INDEPENDENT_AMBULATORY_CARE_PROVIDER_SITE_OTHER): Payer: Self-pay | Admitting: Otolaryngology

## 2024-02-29 ENCOUNTER — Ambulatory Visit (INDEPENDENT_AMBULATORY_CARE_PROVIDER_SITE_OTHER): Payer: Self-pay | Admitting: Audiology

## 2024-02-29 VITALS — BP 119/64 | HR 53

## 2024-02-29 DIAGNOSIS — J0101 Acute recurrent maxillary sinusitis: Secondary | ICD-10-CM

## 2024-02-29 DIAGNOSIS — J342 Deviated nasal septum: Secondary | ICD-10-CM | POA: Diagnosis not present

## 2024-02-29 DIAGNOSIS — H9042 Sensorineural hearing loss, unilateral, left ear, with unrestricted hearing on the contralateral side: Secondary | ICD-10-CM | POA: Diagnosis not present

## 2024-02-29 DIAGNOSIS — J310 Chronic rhinitis: Secondary | ICD-10-CM

## 2024-02-29 DIAGNOSIS — R42 Dizziness and giddiness: Secondary | ICD-10-CM

## 2024-02-29 DIAGNOSIS — H9041 Sensorineural hearing loss, unilateral, right ear, with unrestricted hearing on the contralateral side: Secondary | ICD-10-CM

## 2024-02-29 DIAGNOSIS — H9072 Mixed conductive and sensorineural hearing loss, unilateral, left ear, with unrestricted hearing on the contralateral side: Secondary | ICD-10-CM | POA: Diagnosis not present

## 2024-02-29 DIAGNOSIS — J343 Hypertrophy of nasal turbinates: Secondary | ICD-10-CM

## 2024-02-29 DIAGNOSIS — H90A32 Mixed conductive and sensorineural hearing loss, unilateral, left ear with restricted hearing on the contralateral side: Secondary | ICD-10-CM

## 2024-02-29 NOTE — Progress Notes (Signed)
  [address] [place], [place] [address] [phone]  Audiological Evaluation    Name: Debbie Allen     DOB:   07-04-1966      MRN:   [id number]                                                                                     Service Date: 02/29/2024     Accompanied by: unaccompanied   Patient comes today after Dr. Karis, ENT sent a referral for a hearing evaluation due to concerns with dizziness.   Symptoms Yes Details  Hearing loss  [x]  Previous audiogram was at [facility] and showed a left hearing asymmetry  Tinnitus  []    Ear pain/ infections/pressure  []    Balance problems  [x]  Reports dizziness when she has a headache or migraine  Noise exposure history  []    Previous ear surgeries  []    Family history of hearing loss  []    Amplification  [x]  Has a left aid that was fit at [facility]  Other  []      Otoscopy: Right ear: Clear external ear canal and notable landmarks visualized on the tympanic membrane. Left ear:  Clear external ear canal and notable landmarks visualized on the tympanic membrane.  Tympanometry: Right ear: Type Ad- Normal external ear canal volume with normal middle ear pressure and high tympanic membrane compliance. Left ear: Type Ad- Normal external ear canal volume with normal middle ear pressure and high tympanic membrane compliance.    Pure tone Audiometry: Right ear- Normal to moderately severe sensorineural hearing loss from 125 Hz - 8000 Hz. Left ear-  Normal to moderately severe  mixed hearing loss from 125 Hz - 8000 Hz. Noted more air-bone gaps in the left ear when compared to previous audiogram. Rechecked twice but results remained stable.  Speech Audiometry: Right ear- Speech Reception Threshold (SRT) was obtained at 20 dBHL. Left ear-Speech Reception Threshold (SRT) was obtained at 30 dBHL.   Word Recognition Score Tested using NU-6 (recorded) Right ear: 100% was obtained at a presentation level of 65 dBHL  with contralateral masking which is deemed as  excellent. Left ear: 100% was obtained at a presentation level of 70 dBHL with contralateral masking which is deemed as  excellent.   The hearing test results were completed under headphones and re-checked with inserts and results are deemed to be of good reliability. Test technique:  conventional    Impression: Left hearing continues to be worse than the right.   Recommendations: Follow up with ENT as scheduled for today. Return for a hearing evaluation if concerns with hearing changes arise or per MD recommendation. Recommend continue with hearing aid use.   Talicia Sui MARIE LEROUX-MARTINEZ, AUD

## 2024-03-01 MED ORDER — PREDNISONE 10 MG (21) PO TBPK: ORAL_TABLET | ORAL | 0 refills | Status: DC

## 2024-03-01 NOTE — Progress Notes (Signed)
 Patient ID: Debbie Allen, female   DOB: 1966/08/25, 58 y.o.   MRN: [id number]  Follow-up: Recurrent dizziness, hearing loss, recurrent rhinosinusitis  HPI: The patient is a 58 year old female who returns today for follow-up evaluation.  The patient has a history of recurrent dizziness and bilateral sensorineural hearing loss.  Her hearing loss is worse on the left side.  Her MRI scan is negative for retrocochlear lesion.  She is currently wearing a left ear hearing aid.  She also has a history of recurrent dizziness.  Her vestibular neurodiagnostic testing showed no significant peripheral vestibular weakness.  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  Vestibular rehabilitation was recommended.  However, the patient deferred on any intervention at that time.  She returns today reporting no significant dizziness since her last visit.  She has noted only mild imbalance.  She denies any spinning vertigo.  She also denies any recent change in her hearing.  She also has a history of recurrent sinusitis.  She was previously noted to have deviation and bilateral inferior turbinate hypertrophy.  She was treated with 3 courses of antibiotics over the past 6 months.  Currently she complains of pressure sensation over her forehead.  She denies any purulent drainage, fever, or visual change.  Exam: General: Communicates without difficulty, well nourished, no acute distress. Head: Normocephalic, no evidence injury, no tenderness, facial buttresses intact without stepoff. Face/sinus: No tenderness to palpation and percussion. Facial movement is normal and symmetric. Eyes: PERRL, EOMI. No scleral icterus, conjunctivae clear. Neuro: CN II exam reveals vision grossly intact.  No nystagmus at any point of gaze. Ears: Auricles well formed without lesions.  Ear canals are intact without mass or lesion.  No erythema or edema is appreciated.  The TMs are intact without fluid. Nose: External evaluation reveals normal  support and skin without lesions.  Dorsum is intact.  Anterior rhinoscopy reveals congested mucosa over anterior aspect of inferior turbinates and intact septum.  No purulence noted. Oral:  Oral cavity and oropharynx are intact, symmetric, without erythema or edema.  Mucosa is moist without lesions. Neck: Full range of motion without pain.  There is no significant lymphadenopathy.  No masses palpable.  Thyroid  bed within normal limits to palpation.  Parotid glands and submandibular glands equal bilaterally without mass.  Trachea is midline. Neuro:  CN 2-12 grossly intact. Vestibular: No nystagmus at any point of gaze.  Vestibular: There is no nystagmus with pneumatic pressure on either tympanic membrane or Valsalva. The cerebellar examination is unremarkable.    Procedure:  Flexible Nasal Endoscopy: Description: Risks, benefits, and alternatives of flexible endoscopy were explained to the patient.  Specific mention was made of the risk of throat numbness with difficulty swallowing, possible bleeding from the nose and mouth, and pain from the procedure.  The patient gave oral consent to proceed.  The flexible scope was inserted into the right nasal cavity.  Endoscopy of the interior nasal cavity, superior, inferior, and middle meatus was performed. The sphenoid-ethmoid recess was examined.  Severely edematous mucosa was noted.  No polyp, mass, or lesion was appreciated. Nasal septal deviation noted. Olfactory cleft was clear.  Nasopharynx was clear.  Turbinates were hypertrophied but without mass.  The procedure was repeated on the contralateral side with similar findings.  The patient tolerated the procedure well.     Her hearing test shows stable asymmetric left ear sensorineural hearing loss.  Assessment: 1.  Recurrent rhinosinusitis, severely edematous mucosa, nasal septal deviation, and bilateral inferior turbinate  hypertrophy. 2.  Recurrent dizziness, secondary to central vestibular dysfunction.  No  significant peripheral vestibular weakness was noted. 2.  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  Her previous MRI scan was negative for retrocochlear lesion.  Plan: 1.  The physical exam and nasal endoscopy findings are extensively reviewed with the patient.  The hearing test results also reviewed. 2.  Nasacort nasal spray 2 sprays each nostril daily. 3.  Prednisone  Dosepak for 6 days. 4.  The pathophysiology of vestibular dysfunction and dizziness are discussed extensively with the patient.  5.  Vestibular rehabilitation is recommended if her dizziness recurs. 6.  Continue the use of the left ear hearing aid. 7.  The patient will return for reevaluation in 3 months.

## 2024-03-17 ENCOUNTER — Encounter: Payer: Self-pay | Admitting: Gastroenterology

## 2024-04-04 ENCOUNTER — Ambulatory Visit: Payer: Self-pay | Attending: Cardiology | Admitting: Cardiology

## 2024-04-04 ENCOUNTER — Encounter: Payer: Self-pay | Admitting: Cardiology

## 2024-04-04 VITALS — BP 110/70 | HR 57 | Ht 62.0 in | Wt 133.2 lb

## 2024-04-04 DIAGNOSIS — R002 Palpitations: Secondary | ICD-10-CM | POA: Diagnosis not present

## 2024-04-04 DIAGNOSIS — I498 Other specified cardiac arrhythmias: Secondary | ICD-10-CM | POA: Diagnosis not present

## 2024-04-04 DIAGNOSIS — Q245 Malformation of coronary vessels: Secondary | ICD-10-CM | POA: Diagnosis not present

## 2024-04-04 DIAGNOSIS — E782 Mixed hyperlipidemia: Secondary | ICD-10-CM | POA: Diagnosis not present

## 2024-04-04 MED ORDER — ROSUVASTATIN CALCIUM 20 MG PO TABS: 20.0000 mg | ORAL_TABLET | Freq: Every day | ORAL | 3 refills | Status: DC

## 2024-04-04 NOTE — Progress Notes (Signed)
    Cardiology Office Note  Date: 04/04/2024   ID: Debbie Allen, DOB 1965-12-12, MRN [id number]  History of Present Illness: Debbie Allen is a 58 y.o. female last seen in October 2024.  She is here for a follow-up visit.  Reports rare, very brief palpitations, nothing prolonged.  She has lost weight through a bariatric program in [place] and states that she feels better.  Reports no exertional chest pain, no sudden dizziness or syncope.  She has been traveling a lot with her family this summer.  We went over her medications.  We discussed stopping low-dose Lopressor .  I went over her interval lab work.  LDL 67 recently.  She does need a refill for Crestor .  I reviewed her ECG today which shows sinus bradycardia.  Physical Exam: VS:  BP 110/70 (BP Location: Left Arm, Cuff Size: Normal)   Pulse (!) 57   Ht 5' 2 (1.575 m)   Wt 133 lb 3.2 oz (60.4 kg)   LMP 05/31/2014   BMI 24.36 kg/m , BMI Body mass index is 24.36 kg/m.  Wt Readings from Last 3 Encounters:  04/04/24 133 lb 3.2 oz (60.4 kg)  09/02/23 150 lb (68 kg)  06/16/23 157 lb 6.4 oz (71.4 kg)    General: Patient appears comfortable at rest. HEENT: Conjunctiva and lids normal. Neck: Supple, no elevated JVP or carotid bruits. Lungs: Clear to auscultation, nonlabored breathing at rest. Cardiac: Regular rate and rhythm, no S3 or significant systolic murmur. Extremities: No pitting edema.  ECG:  An ECG dated 10/13/2022 was personally reviewed today and demonstrated:  Sinus rhythm with blocked PAC.  Labwork:  July 2025: TSH 5.45, hemoglobin 14.1, platelets 333, BUN 18, creatinine 0.9, potassium 4.3, GFR 75, AST 19, ALT 29, cholesterol 157, glycerides 135, HDL 67, LDL 67, hemoglobin A1c 5.6%  Other Studies Reviewed Today:  No interval cardiac testing for review today.  Assessment and Plan:  1.  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  No angina or functional  limitation at this time.  Plan to stop low-dose Lopressor  and continue verapamil  SR 240 mg daily.  She is also on aspirin  81 mg daily.   2.  Mixed hyperlipidemia.  LDL 67 in July.  Continue Crestor  20 mg daily.   3.  Palpitations by history.  Last cardiac monitor in March 2024 showed sinus rhythm with rare PACs some of which were nonconducted, also rare PVCs.  There were no sustained arrhythmias or pauses at that time.  No progressive symptoms.  Stopping Lopressor  as discussed above.   4.  OSA tolerating CPAP.  Disposition:  Follow up 6 months.  Signed, Jayson JUDITHANN Sierras, M.D., F.A.C.C. [facility]

## 2024-04-04 NOTE — Patient Instructions (Signed)
 Medication Instructions:   STOP Lopressor   I refilled  your Crestor   Labwork: None today  Testing/Procedures: None today  Follow-Up: 6 months  Any Other Special Instructions Will Be Listed Below (If Applicable).  If you need a refill on your cardiac medications before your next appointment, please call your pharmacy.

## 2024-04-14 ENCOUNTER — Encounter: Payer: Self-pay | Admitting: Gastroenterology

## 2024-04-14 ENCOUNTER — Ambulatory Visit

## 2024-04-14 VITALS — Ht 63.0 in | Wt 128.0 lb

## 2024-04-14 DIAGNOSIS — Z860100 Personal history of colon polyps, unspecified: Secondary | ICD-10-CM

## 2024-04-14 MED ORDER — NA SULFATE-K SULFATE-MG SULF 17.5-3.13-1.6 GM/177ML PO SOLN: 1.0000 | Freq: Once | ORAL | 0 refills | Status: AC

## 2024-04-14 NOTE — Progress Notes (Signed)
 No egg or soy allergy known to patient  No issues known to pt with past sedation with any surgeries or procedures Patient denies ever being told they had issues or difficulty with intubation  No FH of Malignant Hyperthermia Pt is not on diet pills Pt is not on  home 02  Pt is not on blood thinners  Constipation: yes taking OTC metamucil and magnesium with some improvement  No A fib or A flutter Have any cardiac testing pending-- no  LOA: independent  Prep: suprep    PV completed with patient. Prep instructions sent via mychart and home address.

## 2024-05-12 ENCOUNTER — Ambulatory Visit: Admitting: Gastroenterology

## 2024-05-12 ENCOUNTER — Encounter: Payer: Self-pay | Admitting: Gastroenterology

## 2024-05-12 VITALS — BP 120/71 | HR 52 | Temp 97.8°F | Resp 16 | Ht 63.0 in

## 2024-05-12 DIAGNOSIS — Z860101 Personal history of adenomatous and serrated colon polyps: Secondary | ICD-10-CM

## 2024-05-12 DIAGNOSIS — D123 Benign neoplasm of transverse colon: Secondary | ICD-10-CM | POA: Diagnosis not present

## 2024-05-12 DIAGNOSIS — D125 Benign neoplasm of sigmoid colon: Secondary | ICD-10-CM

## 2024-05-12 DIAGNOSIS — Z1211 Encounter for screening for malignant neoplasm of colon: Secondary | ICD-10-CM | POA: Diagnosis present

## 2024-05-12 DIAGNOSIS — K648 Other hemorrhoids: Secondary | ICD-10-CM

## 2024-05-12 DIAGNOSIS — K5730 Diverticulosis of large intestine without perforation or abscess without bleeding: Secondary | ICD-10-CM

## 2024-05-12 DIAGNOSIS — Z860100 Personal history of colon polyps, unspecified: Secondary | ICD-10-CM

## 2024-05-12 MED ORDER — SODIUM CHLORIDE 0.9 % IV SOLN: 500.0000 mL | Freq: Once | INTRAVENOUS | Status: DC

## 2024-05-12 NOTE — Progress Notes (Signed)
 Sedate, gd SR, tolerated procedure well, VSS, report to RN

## 2024-05-12 NOTE — Op Note (Signed)
  [facility] Patient Name: Debbie Allen Procedure Date: 05/12/2024 8:28 AM MRN: [id number] Endoscopist: Elspeth P. Leigh , MD, [id number] Age: 58 Referring MD:  Date of Birth: Apr 28, 1966 Gender: Female Account #: [id number] Procedure:                Colonoscopy Indications:              High risk colon cancer surveillance: Personal                            history of colonic polyps - advanced adenomas                            removed 2021/2022 Medicines:                Monitored Anesthesia Care Procedure:                Pre-Anesthesia Assessment:                           - Prior to the procedure, a History and Physical                            was performed, and patient medications and                            allergies were reviewed. The patient's tolerance of                            previous anesthesia was also reviewed. The risks                            and benefits of the procedure and the sedation                            options and risks were discussed with the patient.                            All questions were answered, and informed consent                            was obtained. Prior Anticoagulants: The patient has                            taken no anticoagulant or antiplatelet agents. ASA                            Grade Assessment: II - A patient with mild systemic                            disease. After reviewing the risks and benefits,                            the patient was deemed in satisfactory condition to  undergo the procedure.                           After obtaining informed consent, the colonoscope                            was passed under direct vision. Throughout the                            procedure, the patient's blood pressure, pulse, and                            oxygen saturations were monitored continuously. The                            Olympus Scope SN: [id number] was introduced  through                            the anus and advanced to the the cecum, identified                            by appendiceal orifice and ileocecal valve. The                            colonoscopy was performed without difficulty. The                            patient tolerated the procedure well. The quality                            of the bowel preparation was adequate. The                            ileocecal valve, appendiceal orifice, and rectum                            were photographed. Scope In: 8:40:22 AM Scope Out: 8:57:07 AM Scope Withdrawal Time: 0 hours 13 minutes 44 seconds  Total Procedure Duration: 0 hours 16 minutes 45 seconds  Findings:                 The perianal and digital rectal examinations were                            normal.                           A 4 mm polyp was found in the hepatic flexure. The                            polyp was sessile. The polyp was removed with a                            cold snare. Resection and retrieval were complete.  A 4 mm polyp was found in the sigmoid colon. The                            polyp was sessile. The polyp was removed with a                            cold snare. Resection and retrieval were complete.                           Multiple Dun-mouthed diverticula were found in                            the transverse colon and left colon.                           Internal hemorrhoids were found during                            retroflexion. The hemorrhoids were Gatz.                           The exam was otherwise without abnormality. Complications:            No immediate complications. Estimated blood loss:                            Minimal. Estimated Blood Loss:     Estimated blood loss was minimal. Impression:               - One 4 mm polyp at the hepatic flexure, removed                            with a cold snare. Resected and retrieved.                           -  One 4 mm polyp in the sigmoid colon, removed with                            a cold snare. Resected and retrieved.                           - Diverticulosis in the transverse colon and in the                            left colon.                           - Internal hemorrhoids.                           - The examination was otherwise normal. Recommendation:           - Patient has a contact number available for  emergencies. The signs and symptoms of potential                            delayed complications were discussed with the                            patient. Return to normal activities tomorrow.                            Written discharge instructions were provided to the                            patient.                           - Resume previous diet.                           - Continue present medications.                           - Await pathology results. Anticipate repeat                            colonoscopy in 5 years given advanced adenoma                            removed on the last exam. Elspeth P. Jode Lippe, MD 05/12/2024 9:04:31 AM This report has been signed electronically.

## 2024-05-12 NOTE — Patient Instructions (Signed)
-  Handout on polyps, diverticulosis, and hemorrhoids provided -await pathology results -repeat colonoscopy for surveillance recommended. Date to be determined when pathology result become available   -Continue present medications   YOU HAD AN ENDOSCOPIC PROCEDURE TODAY AT THE [facility]:   Refer to the procedure report that was given to you for any specific questions about what was found during the examination.  If the procedure report does not answer your questions, please call your gastroenterologist to clarify.  If you requested that your care partner not be given the details of your procedure findings, then the procedure report has been included in a sealed envelope for you to review at your convenience later.  YOU SHOULD EXPECT: Some feelings of bloating in the abdomen. Passage of more gas than usual.  Walking can help get rid of the air that was put into your GI tract during the procedure and reduce the bloating. If you had a lower endoscopy (such as a colonoscopy or flexible sigmoidoscopy) you may notice spotting of blood in your stool or on the toilet paper. If you underwent a bowel prep for your procedure, you may not have a normal bowel movement for a few days.  Please Note:  You might notice some irritation and congestion in your nose or some drainage.  This is from the oxygen used during your procedure.  There is no need for concern and it should clear up in a day or so.  SYMPTOMS TO REPORT IMMEDIATELY:  Following lower endoscopy (colonoscopy or flexible sigmoidoscopy):  Excessive amounts of blood in the stool  Significant tenderness or worsening of abdominal pains  Swelling of the abdomen that is new, acute  Fever of 100F or higher   For urgent or emergent issues, a gastroenterologist can be reached at any hour by calling [phone]. Do not use MyChart messaging for urgent concerns.    DIET:  We do recommend a small meal at first, but then you may proceed to  your regular diet.  Drink plenty of fluids but you should avoid alcoholic beverages for 24 hours.  ACTIVITY:  You should plan to take it easy for the rest of today and you should NOT DRIVE or use heavy machinery until tomorrow (because of the sedation medicines used during the test).    FOLLOW UP: Our staff will call the number listed on your records the next business day following your procedure.  We will call around 7:15- 8:00 am to check on you and address any questions or concerns that you may have regarding the information given to you following your procedure. If we do not reach you, we will leave a message.     If any biopsies were taken you will be contacted by phone or by letter within the next 1-3 weeks.  Please call us at [phone] if you have not heard about the biopsies in 3 weeks.    SIGNATURES/CONFIDENTIALITY: You and/or your care partner have signed paperwork which will be entered into your electronic medical record.  These signatures attest to the fact that that the information above on your After Visit Summary has been reviewed and is understood.  Full responsibility of the confidentiality of this discharge information lies with you and/or your care-partner.

## 2024-05-12 NOTE — Progress Notes (Signed)
 [facility]  Pt's states no medical or surgical changes since previsit or office visit.

## 2024-05-12 NOTE — Progress Notes (Signed)
 [facility] History and Physical   Primary Care Physician:  Shona Norleen PEDLAR, MD   Reason for Procedure:   History of polyps  Plan:    colonoscopy     HPI: Debbie Allen is a 58 y.o. female  here for colonoscopy surveillance - last exam 2022, history of 2 advanced adenomas.  Patient denies any bowel symptoms at this time other than some constipation at times. Otherwise feels well without any cardiopulmonary symptoms.   I have discussed risks / benefits of anesthesia and endoscopic procedure with Debbie Allen and they wish to proceed with the exams as outlined today.    Past Medical History:  Diagnosis Date   Acid reflux    Allergy    Ankle fracture, left    Status post surgery and with neuropathy   Aortic insufficiency (mild)    a. 10/2022 Echo: EF 60-65%, no rwma, GrI DD, nl RV fxn, mild AI.   Aortic regurgitation    Asymptomatic   Arthritis    Asthma    Chest pain w/ cardiac cath 2008   a. 04/2007 Cath: LM nl, LAD nl, large septal w/ intramyocardial bridge - complete disappearance of contrast w/ systole. LCX nl, RCA anomalous origin, otw nl; b. 06/2014 MV: Nl EF. No ischemia or infarct.   Colon polyps    Concussion    Childhood   History of adenomatous and serrated colon polyps    History of colon polyps    Hyperlipidemia    PSVT (paroxysmal supraventricular tachycardia) (HCC)    Palpitations - suspected diagnosis (LGL postulated by Dr. Mona but never documented)   Sinus bradycardia    a. 10/2022 noted on home monitoring; b. 11/2022 Zio: predominantly sinus rhythm with average rate of 69 (46-126).  Rare PACs/PVCs (less than 1%).  Some PACs were nonconducted.  No significant arrhythmias or pauses.   Umbilical hernia     Past Surgical History:  Procedure Laterality Date   COLONOSCOPY  07/2019   DILATION AND CURETTAGE OF UTERUS     HARDWARE REMOVAL Left 08/29/2021   Procedure: HARDWARE REMOVAL LEFT ANKLE;  Surgeon: Sharl Selinda Dover, MD;  Location: [facility] OR;   Service: Orthopedics;  Laterality: Left;   LEFT HEART CATH     ORIF ANKLE FRACTURE Left 05/11/2020   Procedure: OPEN REDUCTION INTERNAL FIXATION (ORIF) ANKLE FRACTURE;  Surgeon: Sharl Selinda Dover, MD;  Location: [facility] OR;  Service: Orthopedics;  Laterality: Left;    Prior to Admission medications   Medication Sig Start Date End Date Taking? Authorizing Provider  aspirin  EC 81 MG tablet Take 1 tablet (81 mg total) by mouth daily. 06/04/20  Yes Singh, Prashant K, MD  esomeprazole (NEXIUM) 20 MG capsule Take 20 mg by mouth daily.   Yes [provider]  levocetirizine (XYZAL) 5 MG tablet Take 5 mg by mouth every evening. 10/01/13  Yes [provider]  levothyroxine (SYNTHROID) 50 MCG tablet Take 50 mcg by mouth daily. 03/17/24  Yes [provider]  methocarbamol (ROBAXIN) 500 MG tablet TAKE 1 TO 2 TABLETS BY MOUTH THREE TIMES DAILY AS NEEDED 03/17/24  Yes [provider]  Potassium Chloride  ER 20 MEQ TBCR TAKE 2 TABLETS BY MOUTH DAILY 11/19/23  Yes Debera Jayson MATSU, MD  rosuvastatin  (CRESTOR ) 20 MG tablet Take 1 tablet (20 mg total) by mouth at bedtime. 04/04/24  Yes Debera Jayson MATSU, MD  verapamil  (CALAN -SR) 240 MG CR tablet Take 1 tablet (240 mg total) by mouth at bedtime. 02/22/24  Yes Debera Jayson MATSU, MD  albuterol  (PROVENTIL  HFA;VENTOLIN  HFA) 108 (90 BASE) MCG/ACT inhaler Inhale 2 puffs into the lungs every 6 (six) hours as needed for wheezing or shortness of breath.    [provider]  Ascorbic Acid (VITAMIN C) 1000 MG tablet Take 1,000 mg by mouth daily.    [provider]  budesonide-formoterol  (SYMBICORT) 160-4.5 MCG/ACT inhaler Inhale 2 puffs into the lungs daily as needed (asthma).    [provider]  EPINEPHrine  0.3 mg/0.3 mL IJ SOAJ injection Inject 0.3 mg into the muscle as needed for anaphylaxis. 09/17/20   [provider]  fluticasone OREN) 50 MCG/ACT nasal spray  09/09/23   [provider]  ketotifen  (ZADITOR) 0.025 % ophthalmic solution Place 1 drop into both eyes 2 (two) times daily as needed (allergies).    [provider]  MAGNESIUM PO Take by mouth.    [provider]  meclizine (ANTIVERT) 12.5 MG tablet Take 25 mg by mouth 3 (three) times daily as needed for dizziness.    [provider]  MELATONIN PO Take 1 tablet by mouth at bedtime as needed (sleep).    [provider]  METAMUCIL FIBER PO Take by mouth.    [provider]  montelukast  (SINGULAIR ) 10 MG tablet Take 10 mg by mouth at bedtime. 10/01/13   [provider]  Multiple Vitamins-Minerals (MULTIVITAMIN WITH MINERALS) tablet Take 1 tablet by mouth daily.    [provider]  Olopatadine HCl 0.6 % SOLN Place 1 spray into the nose in the morning and at bedtime. Patient taking differently: Place 1 spray into the nose 2 (two) times daily as needed.    [provider]    Current Outpatient Medications  Medication Sig Dispense Refill   aspirin  EC 81 MG tablet Take 1 tablet (81 mg total) by mouth daily. 90 tablet 3   esomeprazole (NEXIUM) 20 MG capsule Take 20 mg by mouth daily.     levocetirizine (XYZAL) 5 MG tablet Take 5 mg by mouth every evening.     levothyroxine (SYNTHROID) 50 MCG tablet Take 50 mcg by mouth daily.     methocarbamol (ROBAXIN) 500 MG tablet TAKE 1 TO 2 TABLETS BY MOUTH THREE TIMES DAILY AS NEEDED     Potassium Chloride  ER 20 MEQ TBCR TAKE 2 TABLETS BY MOUTH DAILY 180 tablet 1   rosuvastatin  (CRESTOR ) 20 MG tablet Take 1 tablet (20 mg total) by mouth at bedtime. 90 tablet 3   verapamil  (CALAN -SR) 240 MG CR tablet Take 1 tablet (240 mg total) by mouth at bedtime. 30 tablet 5   albuterol  (PROVENTIL  HFA;VENTOLIN  HFA) 108 (90 BASE) MCG/ACT inhaler Inhale 2 puffs into the lungs every 6 (six) hours as needed for wheezing or shortness of breath.     Ascorbic Acid (VITAMIN C) 1000 MG tablet Take 1,000 mg by mouth daily.     budesonide-formoterol   (SYMBICORT) 160-4.5 MCG/ACT inhaler Inhale 2 puffs into the lungs daily as needed (asthma).     EPINEPHrine  0.3 mg/0.3 mL IJ SOAJ injection Inject 0.3 mg into the muscle as needed for anaphylaxis.     fluticasone (FLONASE) 50 MCG/ACT nasal spray      ketotifen (ZADITOR) 0.025 % ophthalmic solution Place 1 drop into both eyes 2 (two) times daily as needed (allergies).     MAGNESIUM PO Take by mouth.     meclizine (ANTIVERT) 12.5 MG tablet Take 25 mg by mouth 3 (three) times daily as needed for dizziness.  MELATONIN PO Take 1 tablet by mouth at bedtime as needed (sleep).     METAMUCIL FIBER PO Take by mouth.     montelukast  (SINGULAIR ) 10 MG tablet Take 10 mg by mouth at bedtime.     Multiple Vitamins-Minerals (MULTIVITAMIN WITH MINERALS) tablet Take 1 tablet by mouth daily.     Olopatadine HCl 0.6 % SOLN Place 1 spray into the nose in the morning and at bedtime. (Patient taking differently: Place 1 spray into the nose 2 (two) times daily as needed.)     Current Facility-Administered Medications  Medication Dose Route Frequency Provider Last Rate Last Admin   0.9 %  sodium chloride  infusion  500 mL Intravenous Once Winona Sison, Elspeth SQUIBB, MD        Allergies as of 05/12/2024 - Review Complete 05/12/2024  Allergen Reaction Noted   Codeine Itching 10/19/2013    Family History  Problem Relation Age of Onset   Heart disease Father 72   Hyperlipidemia Father    Colon polyps Father        benign   Hyperlipidemia Sister    Hypertension Paternal Grandmother    Heart disease Paternal Grandfather    Heart attack Paternal Grandfather    Colon cancer Neg Hx    Esophageal cancer Neg Hx    Rectal cancer Neg Hx    Stomach cancer Neg Hx     Social History   Socioeconomic History   Marital status: Married    Spouse name: Not on file   Number of children: 2   Years of education: Not on file   Highest education level: Not on file  Occupational History   Occupation: retired Runner, broadcasting/film/video   Tobacco Use   Smoking status: Never   Smokeless tobacco: Never  Vaping Use   Vaping status: Never Used  Substance and Sexual Activity   Alcohol use: Not Currently    Comment: Rarely   Drug use: No   Sexual activity: Not on file  Other Topics Concern   Not on file  Social History Narrative   Not on file   Social Drivers of Health   Financial Resource Strain: Not on file  Food Insecurity: No Food Insecurity (02/01/2024)   Received from [facility]   Hunger Vital Sign    Within the past 12 months, you worried that your food would run out before you got the money to buy more.: Never true    Within the past 12 months, the food you bought just didn't last and you didn't have money to get more.: Never true  Transportation Needs: No Transportation Needs (02/01/2024)   Received from [facility]   PRAPARE - Transportation    Lack of Transportation (Medical): No    Lack of Transportation (Non-Medical): No  Physical Activity: Insufficiently Active (01/27/2023)   Received from [facility]   Exercise Vital Sign    On average, how many days per week do you engage in moderate to strenuous exercise (like a brisk walk)?: 4 days    On average, how many minutes do you engage in exercise at this level?: 30 min  Stress: Not on file  Social Connections: Not on file  Intimate Partner Violence: Not At Risk (02/01/2024)   Received from [facility]   Humiliation, Afraid, Rape, and Kick questionnaire    Within the last year, have you been afraid of your partner or ex-partner?: No    Within the last year, have you been humiliated or emotionally  abused in other ways by your partner or ex-partner?: No    Within the last year, have you been kicked, hit, slapped, or otherwise physically hurt by your partner or ex-partner?: No    Within the last year, have you been raped or forced to have any kind of sexual activity by your partner or ex-partner?: No    Review of Systems: All other review of  systems negative except as mentioned in the HPI.  Physical Exam: Vital signs BP 109/72 (BP Location: Right Arm, Patient Position: Sitting, Cuff Size: Normal)   Pulse 60   Temp 97.8 F (36.6 C) (Temporal)   Ht 5' 3 (1.6 m)   LMP 05/31/2014   SpO2 99%   BMI 22.67 kg/m   General:   Alert,  Well-developed, pleasant and cooperative in NAD Lungs:  Clear throughout to auscultation.   Heart:  Regular rate and rhythm Abdomen:  Soft, nontender and nondistended.   Neuro/Psych:  Alert and cooperative. Normal mood and affect. A and O x 3  Marcey Naval, MD [facility]

## 2024-05-12 NOTE — Progress Notes (Signed)
 Called to room to assist during endoscopic procedure.  Patient ID and intended procedure confirmed with present staff. Received instructions for my participation in the procedure from the performing physician.

## 2024-05-15 ENCOUNTER — Telehealth: Payer: Self-pay

## 2024-05-15 NOTE — Telephone Encounter (Signed)
Attempted to reach patient for post-procedure f/u call. No answer. Left message for her to please not hesitate to call if she has any questions/concerns regarding her care. 

## 2024-05-16 LAB — SURGICAL PATHOLOGY

## 2024-05-17 ENCOUNTER — Ambulatory Visit: Payer: Self-pay | Admitting: Gastroenterology

## 2024-06-04 ENCOUNTER — Other Ambulatory Visit: Payer: Self-pay | Admitting: Cardiology

## 2024-06-06 ENCOUNTER — Ambulatory Visit (INDEPENDENT_AMBULATORY_CARE_PROVIDER_SITE_OTHER): Admitting: Otolaryngology

## 2024-06-06 ENCOUNTER — Encounter (INDEPENDENT_AMBULATORY_CARE_PROVIDER_SITE_OTHER): Payer: Self-pay | Admitting: Otolaryngology

## 2024-06-06 VITALS — BP 125/70 | HR 60 | Temp 98.1°F

## 2024-06-06 DIAGNOSIS — J310 Chronic rhinitis: Secondary | ICD-10-CM | POA: Diagnosis not present

## 2024-06-06 DIAGNOSIS — R42 Dizziness and giddiness: Secondary | ICD-10-CM

## 2024-06-06 DIAGNOSIS — H9042 Sensorineural hearing loss, unilateral, left ear, with unrestricted hearing on the contralateral side: Secondary | ICD-10-CM

## 2024-06-06 DIAGNOSIS — H6983 Other specified disorders of Eustachian tube, bilateral: Secondary | ICD-10-CM

## 2024-06-06 DIAGNOSIS — J343 Hypertrophy of nasal turbinates: Secondary | ICD-10-CM

## 2024-06-06 DIAGNOSIS — J342 Deviated nasal septum: Secondary | ICD-10-CM

## 2024-06-06 DIAGNOSIS — R0981 Nasal congestion: Secondary | ICD-10-CM | POA: Diagnosis not present

## 2024-06-06 DIAGNOSIS — H6980 Other specified disorders of Eustachian tube, unspecified ear: Secondary | ICD-10-CM

## 2024-06-06 NOTE — Progress Notes (Unsigned)
 Patient ID: Debbie Allen, female   DOB: 12-May-1966, 58 y.o.   MRN: [id number]  Follow-up: Recurrent dizziness, hearing loss, recurrent rhinosinusitis  New complaint: Clogging sensation in ears  HPI: The patient is a 58 year old female who presents today with new complaint of clogging sensation in her ear since July of this year.  The patient has a history of chronic nasal congestion, recurrent sinusitis, hearing loss, and recurrent dizziness. Her vestibular neurodiagnostic testing showed no significant peripheral vestibular weakness. 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 Vestibular rehabilitation was recommended. She also has a history of recurrent sinusitis. She was previously noted to have deviation and bilateral inferior turbinate hypertrophy. She was treated with antibiotics, prednisone , and Nasacort nasal sprays.  The patient returns today complaining of occasional lightheaded sensation/dizziness.  She denies any spinning vertigo.  She is still having occasional nasal congestion.  She denies any change in her hearing.  Currently she wears bilateral hearing aids.  The clogging sensation in her ear is having constant since July.  She denies any significant otalgia or otorrhea.  Exam: General: Communicates without difficulty, well nourished, no acute distress. Head: Normocephalic, no evidence injury, no tenderness, facial buttresses intact without stepoff. Face/sinus: No tenderness to palpation and percussion. Facial movement is normal and symmetric. Eyes: PERRL, EOMI. No scleral icterus, conjunctivae clear. Neuro: CN II exam reveals vision grossly intact.  No nystagmus at any point of gaze. Ears: Auricles well formed without lesions.  Ear canals are intact without mass or lesion.  No erythema or edema is appreciated.  The TMs are intact without fluid. Nose: External evaluation reveals normal support and skin without lesions.  Dorsum is intact.  Anterior rhinoscopy reveals congested mucosa  over anterior aspect of inferior turbinates and deviated septum.  No purulence noted. Oral:  Oral cavity and oropharynx are intact, symmetric, without erythema or edema.  Mucosa is moist without lesions. Neck: Full range of motion without pain.  There is no significant lymphadenopathy.  No masses palpable.  Thyroid  bed within normal limits to palpation.  Parotid glands and submandibular glands equal bilaterally without mass.  Trachea is midline. Neuro:  CN 2-12 grossly intact.   Assessment: 1.  Clinical eustachian tube dysfunction. 2.  Chronic rhinitis with nasal mucosal congestion, nasal septal deviation, and bilateral inferior turbinate hypertrophy. 3.  Recurrent dizziness, likely secondary to central vestibular dysfunction. 4.  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  Her previous MRI scan was negative.  Plan: 1.  The physical exam findings are reviewed with the patient. 2.  Continue with Flonase nasal spray 2 sprays each nostril daily.  The importance of consistent daily use is discussed. 3.  Valsalva exercise multiple times a day. 4.  Physical therapy if her dizziness worsens. 5.  The patient will return for reevaluation in 6 months.

## 2024-06-07 DIAGNOSIS — H6983 Other specified disorders of Eustachian tube, bilateral: Secondary | ICD-10-CM | POA: Insufficient documentation

## 2024-10-07 ENCOUNTER — Other Ambulatory Visit: Payer: Self-pay | Admitting: Cardiology

## 2024-10-11 ENCOUNTER — Encounter: Payer: Self-pay | Admitting: Cardiology

## 2024-10-11 ENCOUNTER — Ambulatory Visit: Admitting: Cardiology

## 2024-10-11 VITALS — BP 110/64 | HR 51 | Ht 63.0 in | Wt 137.2 lb

## 2024-10-11 DIAGNOSIS — E782 Mixed hyperlipidemia: Secondary | ICD-10-CM | POA: Diagnosis not present

## 2024-10-11 DIAGNOSIS — R002 Palpitations: Secondary | ICD-10-CM

## 2024-10-11 DIAGNOSIS — Q245 Malformation of coronary vessels: Secondary | ICD-10-CM | POA: Diagnosis not present

## 2024-10-11 MED ORDER — POTASSIUM CHLORIDE ER 20 MEQ PO TBCR: 2.0000 | EXTENDED_RELEASE_TABLET | Freq: Every day | ORAL | 3 refills | Status: AC

## 2024-10-11 MED ORDER — ROSUVASTATIN CALCIUM 20 MG PO TABS: 20.0000 mg | ORAL_TABLET | Freq: Every day | ORAL | 3 refills | Status: AC

## 2024-10-11 NOTE — Progress Notes (Signed)
"  ° ° °  Cardiology Office Note  Date: 10/11/2024   ID: Debbie Allen, DOB Oct 22, 1965, MRN [id number]  History of Present Illness: Debbie Allen is a 59 y.o. female last seen in July 2025.  She is here for a routine visit.  Reports no exertional chest pain or functional limitation.  She tells me that her son was diagnosed with testicular cancer around Thanksgiving, she has been under a lot of stress, experiencing some palpitations during this time but no syncope.  She also gained about 12 pounds, follow-up lipids showed LDL up to 90 although HDL 93.  She has been consistent with lipid-lowering medication.  We went over her medications which are stable otherwise, no obvious intolerances.  Physical Exam: VS:  BP 110/64 (BP Location: Left Arm, Patient Position: Sitting, Cuff Size: Normal)   Pulse (!) 51   Ht 5' 3 (1.6 m)   Wt 137 lb 3.2 oz (62.2 kg)   LMP 05/31/2014   SpO2 99%   BMI 24.30 kg/m , BMI Body mass index is 24.3 kg/m.  Wt Readings from Last 3 Encounters:  10/11/24 137 lb 3.2 oz (62.2 kg)  04/14/24 128 lb (58.1 kg)  04/04/24 133 lb 3.2 oz (60.4 kg)    General: Patient appears comfortable at rest. HEENT: Conjunctiva and lids normal. Neck: Supple, no elevated JVP or carotid bruits. Lungs: Clear to auscultation, nonlabored breathing at rest. Cardiac: Regular rate and rhythm, no S3 or significant systolic murmur.  ECG:  An ECG dated 04/04/2024 was personally reviewed today and demonstrated:  Sinus bradycardia.  Labwork:  January 2026: TSH 4.35, hemoglobin 13.4, platelets 284, BUN 19, creatinine 0.81, GFR 84, potassium 4.8, AST 24, ALT 22, cholesterol 197, triglycerides 81, HDL 93, LDL 90, hemoglobin A1c 5.6%  Other Studies Reviewed Today:  No interval cardiac testing for review today.  Assessment and Plan:  1.  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  Asymptomatic with no exertional angina.  She is on aspirin  81 mg daily  at this time.   2.  Mixed hyperlipidemia.  LDL 90 and HDL 93 in January.  Discussed weight loss as an initial step, her LDL had previously been under 70 on Crestor  20 mg daily which she continues.  Follow-up lipids with PCP this summer as scheduled.   3.  Palpitations by history.  Last cardiac monitor in March 2024 showed sinus rhythm with rare PACs some of which were nonconducted, also rare PVCs.  She has had some symptoms recently in the setting of stress, no syncope.  Heart rate regular today.  Continue with observation.   4.  OSA tolerating CPAP.  Disposition:  Follow up 6 months.  Signed, Jayson JUDITHANN Sierras, M.D., F.A.C.C. [facility] "

## 2024-10-11 NOTE — Patient Instructions (Signed)
 Medication Instructions:   Your physician recommends that you continue on your current medications as directed. Please refer to the Current Medication list given to you today.   Labwork: None today  Testing/Procedures: None today  Follow-Up: 6 months Dr.McDowell  Any Other Special Instructions Will Be Listed Below (If Applicable).  If you need a refill on your cardiac medications before your next appointment, please call your pharmacy.

## 2024-12-05 ENCOUNTER — Ambulatory Visit (INDEPENDENT_AMBULATORY_CARE_PROVIDER_SITE_OTHER): Admitting: Otolaryngology
